# Patient Record
Sex: Female | Born: 1995
Health system: Southern US, Community
[De-identification: ages and names within clinical notes are randomized; demographics above are authoritative.]

## PROBLEM LIST (undated history)

## (undated) DIAGNOSIS — D649 Anemia, unspecified: Secondary | ICD-10-CM

## (undated) DIAGNOSIS — K209 Esophagitis, unspecified without bleeding: Secondary | ICD-10-CM

## (undated) DIAGNOSIS — R011 Cardiac murmur, unspecified: Secondary | ICD-10-CM

## (undated) DIAGNOSIS — F32A Depression, unspecified: Secondary | ICD-10-CM

## (undated) DIAGNOSIS — F419 Anxiety disorder, unspecified: Secondary | ICD-10-CM

## (undated) DIAGNOSIS — F329 Major depressive disorder, single episode, unspecified: Secondary | ICD-10-CM

## (undated) DIAGNOSIS — F1290 Cannabis use, unspecified, uncomplicated: Secondary | ICD-10-CM

## (undated) DIAGNOSIS — T7840XA Allergy, unspecified, initial encounter: Secondary | ICD-10-CM

## (undated) DIAGNOSIS — K219 Gastro-esophageal reflux disease without esophagitis: Secondary | ICD-10-CM

## (undated) DIAGNOSIS — K2970 Gastritis, unspecified, without bleeding: Secondary | ICD-10-CM

## (undated) DIAGNOSIS — R112 Nausea with vomiting, unspecified: Secondary | ICD-10-CM

## (undated) HISTORY — DX: Gastritis, unspecified, without bleeding: K29.70

## (undated) HISTORY — DX: Esophagitis, unspecified without bleeding: K20.90

## (undated) HISTORY — DX: Anemia, unspecified: D64.9

## (undated) HISTORY — DX: Cardiac murmur, unspecified: R01.1

## (undated) HISTORY — DX: Major depressive disorder, single episode, unspecified: F32.9

## (undated) HISTORY — DX: Anxiety disorder, unspecified: F41.9

## (undated) HISTORY — DX: Gastro-esophageal reflux disease without esophagitis: K21.9

## (undated) HISTORY — DX: Allergy, unspecified, initial encounter: T78.40XA

## (undated) HISTORY — DX: Depression, unspecified: F32.A

## (undated) HISTORY — PX: NO PAST SURGERIES: SHX2092

---
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
## 2016-07-16 DIAGNOSIS — F321 Major depressive disorder, single episode, moderate: Secondary | ICD-10-CM | POA: Insufficient documentation

## 2017-04-10 DIAGNOSIS — E876 Hypokalemia: Secondary | ICD-10-CM | POA: Diagnosis not present

## 2017-04-10 DIAGNOSIS — R112 Nausea with vomiting, unspecified: Secondary | ICD-10-CM | POA: Diagnosis not present

## 2017-04-10 DIAGNOSIS — E860 Dehydration: Secondary | ICD-10-CM | POA: Diagnosis not present

## 2017-04-10 DIAGNOSIS — R197 Diarrhea, unspecified: Secondary | ICD-10-CM | POA: Diagnosis not present

## 2017-04-10 DIAGNOSIS — Z883 Allergy status to other anti-infective agents status: Secondary | ICD-10-CM | POA: Diagnosis not present

## 2017-06-20 DIAGNOSIS — R1084 Generalized abdominal pain: Secondary | ICD-10-CM | POA: Diagnosis not present

## 2017-06-20 DIAGNOSIS — E876 Hypokalemia: Secondary | ICD-10-CM | POA: Diagnosis not present

## 2017-06-20 DIAGNOSIS — R112 Nausea with vomiting, unspecified: Secondary | ICD-10-CM | POA: Diagnosis not present

## 2017-06-20 DIAGNOSIS — R1013 Epigastric pain: Secondary | ICD-10-CM | POA: Diagnosis not present

## 2017-06-21 DIAGNOSIS — R112 Nausea with vomiting, unspecified: Secondary | ICD-10-CM | POA: Diagnosis not present

## 2017-06-21 DIAGNOSIS — R1013 Epigastric pain: Secondary | ICD-10-CM | POA: Diagnosis not present

## 2017-06-21 DIAGNOSIS — E876 Hypokalemia: Secondary | ICD-10-CM | POA: Diagnosis not present

## 2017-06-21 DIAGNOSIS — R1084 Generalized abdominal pain: Secondary | ICD-10-CM | POA: Diagnosis not present

## 2017-06-21 DIAGNOSIS — R1011 Right upper quadrant pain: Secondary | ICD-10-CM | POA: Diagnosis not present

## 2017-06-23 ENCOUNTER — Encounter: Payer: Self-pay | Admitting: Family Medicine

## 2017-06-23 ENCOUNTER — Encounter: Payer: Self-pay | Admitting: Nurse Practitioner

## 2017-06-23 ENCOUNTER — Other Ambulatory Visit: Payer: Self-pay

## 2017-06-23 ENCOUNTER — Ambulatory Visit (INDEPENDENT_AMBULATORY_CARE_PROVIDER_SITE_OTHER): Payer: 59 | Admitting: Family Medicine

## 2017-06-23 VITALS — BP 132/84 | HR 100 | Temp 98.2°F | Ht 65.0 in | Wt 147.8 lb

## 2017-06-23 DIAGNOSIS — R112 Nausea with vomiting, unspecified: Secondary | ICD-10-CM | POA: Diagnosis not present

## 2017-06-23 DIAGNOSIS — R1013 Epigastric pain: Secondary | ICD-10-CM

## 2017-06-23 MED ORDER — OMEPRAZOLE 20 MG PO CPDR: 20.0000 mg | DELAYED_RELEASE_CAPSULE | Freq: Two times a day (BID) | ORAL | 0 refills | Status: DC

## 2017-06-23 MED ORDER — PROMETHAZINE HCL 12.5 MG PO TABS
12.5000 mg | ORAL_TABLET | Freq: Three times a day (TID) | ORAL | 0 refills | Status: DC | PRN
Start: 2017-06-23 — End: 2017-09-16

## 2017-06-23 NOTE — Progress Notes (Signed)
5/9/201911:44 AM  Diana Thomas 08-22-1995, 22 y.o. female [id number]  Chief Complaint  Patient presents with  . Abdominal Pain    Just released from ED for upper gastric pain for 3 days, having pain and burning with nauasea and vomiting, Cannot eat due to not feeling well. The medication given is not working    HPI:   Patient is a 22 y.o. female with past medical history significant for GERD  who presents today for ER followup.  Patient seen in the ER (not [facility]) yesterday, was rx carafate and prilosec  Patient reports that for past 1-2 years she has had occasional heartburn and pepcid would always work However for past week there has been an increase in her heartburn frequency and intensity She then had a night of binge drinking and she started vomiting and would not stop so she went to the ER Brings report from ER WBC 14 ALT 36 U/A with high spec grav K 3.0  Neg lipase Neg UPT RUQ [place] - neg Abd CT w contrast - neg Today able to drink small sips of water after taking phenegran Tried a smoothie, spirite/crackers but vomited, she denies any coffee ground emesis Eating has been making pain worse Today she is feeling hungry Last BM was floaty and dark but she thinks that is from peptobismol She denies any fever or chills   Fall Risk  06/23/2017  Falls in the past year? No     Depression screen [facility] 2/9 06/23/2017  Decreased Interest (No Data)    No Known Allergies  Prior to Admission medications   Medication Sig Start Date End Date Taking? Authorizing Provider  clonazePAM (KLONOPIN) 0.5 MG tablet Take 0.5 mg by mouth 2 (two) times daily as needed for anxiety.   Yes [provider]  escitalopram (LEXAPRO) 20 MG tablet Take 20 mg by mouth daily.   Yes [provider]  omeprazole (PRILOSEC) 20 MG capsule Take 20 mg by mouth daily.   Yes [provider]  ondansetron (ZOFRAN) 8 MG tablet Take by mouth every 8 (eight) hours as needed for nausea or  vomiting.   Yes [provider]  sucralfate (CARAFATE) 1 g tablet Take 1 g by mouth 4 (four) times daily -  with meals and at bedtime.   Yes [provider]    Past Medical History:  Diagnosis Date  . Allergy   . Anemia   . Anxiety   . Depression   . Gastritis   . Heart murmur     History reviewed. No pertinent surgical history.  Social History   Tobacco Use  . Smoking status: Never Smoker  . Smokeless tobacco: Never Used  Substance Use Topics  . Alcohol use: Yes    Frequency: Never    Comment: occasional drinker    Family History  Problem Relation Age of Onset  . Heart disease Mother   . Hypertension Mother   . Hyperlipidemia Mother   . ADD / ADHD Father   . Healthy Father   . Healthy Sister     ROS Per hpi  OBJECTIVE:  Blood pressure 132/84, pulse 100, temperature 98.2 F (36.8 C), temperature source Oral, height  (1.651 m), weight 147 lb 12.8 oz (67 kg), last menstrual period 06/01/2017, SpO2 99 %.  Physical Exam  Constitutional: She is oriented to person, place, and time. She appears well-developed and well-nourished.  HENT:  Head: Normocephalic and atraumatic.  Mouth/Throat: Oropharynx is clear and moist. No  oropharyngeal exudate.  Eyes: Pupils are equal, round, and reactive to light. EOM are normal. No scleral icterus.  Neck: Neck supple.  Cardiovascular: Normal rate, regular rhythm and normal heart sounds. Exam reveals no gallop and no friction rub.  No murmur heard. Pulmonary/Chest: Effort normal and breath sounds normal. She has no wheezes. She has no rales.  Abdominal: Soft. Bowel sounds are normal. There is no hepatosplenomegaly. There is tenderness in the epigastric area. There is no rebound and no guarding.  Musculoskeletal: She exhibits no edema.  Neurological: She is alert and oriented to person, place, and time.  Skin: Skin is warm and dry.  Psychiatric: She has a normal mood and affect.  Nursing note and vitals  reviewed.    ASSESSMENT and PLAN  1. Abdominal pain, epigastric - H. pylori breath test - Ambulatory referral to Gastroenterology  2. Nausea and vomiting, intractability of vomiting not specified, unspecified vomiting type Continue with supportive measures. Discussed RTC . Excused from work. - H. pylori breath test - Ambulatory referral to Gastroenterology  Other orders - promethazine (PHENERGAN) 12.5 MG tablet; Take 1 tablet (12.5 mg total) by mouth every 8 (eight) hours as needed for nausea or vomiting.  Return in about 1 week (around 06/30/2017), or if symptoms worsen or fail to improve.    Myles Lipps, MD Primary Care at [facility] [address] [place], [place] [address] Ph.  [phone] Fax [phone]

## 2017-06-23 NOTE — Patient Instructions (Addendum)
     IF you received an x-ray today, you will receive an invoice from [facility]. Please contact [facility] at [phone] with questions or concerns regarding your invoice.   IF you received labwork today, you will receive an invoice from [facility]. Please contact [facility] at [phone] with questions or concerns regarding your invoice.   Our billing staff will not be able to assist you with questions regarding bills from these companies.  You will be contacted with the lab results as soon as they are available. The fastest way to get your results is to activate your My Chart account. Instructions are located on the last page of this paperwork. If you have not heard from us regarding the results in 2 weeks, please contact this office.     

## 2017-06-24 LAB — H. PYLORI BREATH TEST: H pylori Breath Test: NEGATIVE

## 2017-06-30 ENCOUNTER — Ambulatory Visit: Payer: 59 | Admitting: Family Medicine

## 2017-07-05 ENCOUNTER — Ambulatory Visit: Payer: 59 | Admitting: Family Medicine

## 2017-07-13 ENCOUNTER — Ambulatory Visit: Payer: Self-pay | Admitting: Nurse Practitioner

## 2017-07-26 DIAGNOSIS — H5213 Myopia, bilateral: Secondary | ICD-10-CM | POA: Diagnosis not present

## 2017-07-26 DIAGNOSIS — H52223 Regular astigmatism, bilateral: Secondary | ICD-10-CM | POA: Diagnosis not present

## 2017-09-16 ENCOUNTER — Ambulatory Visit (INDEPENDENT_AMBULATORY_CARE_PROVIDER_SITE_OTHER): Payer: 59 | Admitting: Family Medicine

## 2017-09-16 ENCOUNTER — Encounter: Payer: Self-pay | Admitting: Family Medicine

## 2017-09-16 ENCOUNTER — Other Ambulatory Visit: Payer: Self-pay

## 2017-09-16 VITALS — BP 111/72 | HR 72 | Temp 98.0°F | Ht 64.5 in | Wt 147.8 lb

## 2017-09-16 DIAGNOSIS — Z113 Encounter for screening for infections with a predominantly sexual mode of transmission: Secondary | ICD-10-CM

## 2017-09-16 DIAGNOSIS — Z01419 Encounter for gynecological examination (general) (routine) without abnormal findings: Secondary | ICD-10-CM

## 2017-09-16 DIAGNOSIS — Z30013 Encounter for initial prescription of injectable contraceptive: Secondary | ICD-10-CM | POA: Diagnosis not present

## 2017-09-16 DIAGNOSIS — F411 Generalized anxiety disorder: Secondary | ICD-10-CM | POA: Insufficient documentation

## 2017-09-16 DIAGNOSIS — N926 Irregular menstruation, unspecified: Secondary | ICD-10-CM | POA: Diagnosis not present

## 2017-09-16 LAB — POCT URINE PREGNANCY: Preg Test, Ur: NEGATIVE

## 2017-09-16 MED ORDER — MEDROXYPROGESTERONE ACETATE 150 MG/ML IM SUSY
150.0000 mg | PREFILLED_SYRINGE | INTRAMUSCULAR | Status: AC
  Administered 2017-09-16 – 2018-03-09 (×3): 150 mg via INTRAMUSCULAR

## 2017-09-16 NOTE — Patient Instructions (Addendum)
For therapy -- 1. [facility] ([place], [place], [place]) [phone]  2. [facility]) [phone] 3. [place] [facility] - [phone] 4. [facility] - [phone] (do not file insurance) 5. [facility] accepts most major insurances. 2048776424 or email [email or web address]  Next depo oct 18 - nov 1, nurse visit ok  IF you received an x-ray today, you will receive an invoice from [facility]. Please contact [facility] at [phone] with questions or concerns regarding your invoice.   IF you received labwork today, you will receive an invoice from [place] [facility]. Please contact [facility] at [phone] with questions or concerns regarding your invoice.   Our billing staff will not be able to assist you with questions regarding bills from these companies.  You will be contacted with the lab results as soon as they are available. The fastest way to get your results is to activate your My Chart account. Instructions are located on the last page of this paperwork. If you have not heard from [place] regarding the results in 2 weeks, please contact this office.     Preventive Care 18-39 Years, Female Preventive care refers to lifestyle choices and visits with your health care provider that can promote health and wellness. What does preventive care include?  A yearly physical exam. This is also called an annual well check.  Dental exams once or twice a year.  Routine eye exams. Ask your health care provider how often you should have your eyes checked.  Personal lifestyle choices, including: ? Daily care of your teeth and gums. ? Regular physical activity. ? Eating a healthy diet. ? Avoiding tobacco and drug use. ? Limiting alcohol use. ? Practicing safe sex. ? Taking vitamin and mineral supplements as recommended by  your health care provider. What happens during an annual well check? The services and screenings done by your health care provider during your annual well check will depend on your age, overall health, lifestyle risk factors, and family history of disease. Counseling Your health care provider may ask you questions about your:  Alcohol use.  Tobacco use.  Drug use.  Emotional well-being.  Home and relationship well-being.  Sexual activity.  Eating habits.  Work and work Statistician.  Method of birth control.  Menstrual cycle.  Pregnancy history.  Screening You may have the following tests or measurements:  Height, weight, and BMI.  Diabetes screening. This is done by checking your blood sugar (glucose) after you have not eaten for a while (fasting).  Blood pressure.  Lipid and cholesterol levels. These may be checked every 5 years starting at age 70.  Skin check.  Hepatitis C blood test.  Hepatitis B blood test.  Sexually transmitted disease (STD) testing.  BRCA-related cancer screening. This may be done if you have a family history of breast, ovarian, tubal, or peritoneal cancers.  Pelvic exam and Pap test. This may be done every 3 years starting at age 78. Starting at age 65, this may be done every 5 years if you have a Pap test in combination with an HPV test.  Discuss your test results, treatment options, and if necessary, the need for more tests with your health care provider. Vaccines Your health care provider may recommend certain vaccines, such as:  Influenza vaccine. This is recommended every year.  Tetanus, diphtheria, and acellular pertussis (Tdap, Td) vaccine. You may need a Td booster every 10 years.  Varicella vaccine. You may need this if you have not been vaccinated.  HPV vaccine. If you are 65 or younger, you may need three doses over 6 months.  Measles, mumps, and rubella (MMR) vaccine. You may need at least one dose of MMR. You may also  need a second dose.  Pneumococcal 13-valent conjugate (PCV13) vaccine. You may need this if you have certain conditions and were not previously vaccinated.  Pneumococcal polysaccharide (PPSV23) vaccine. You may need one or two doses if you smoke cigarettes or if you have certain conditions.  Meningococcal vaccine. One dose is recommended if you are age 37-21 years and a first-year college student living in a residence hall, or if you have one of several medical conditions. You may also need additional booster doses.  Hepatitis A vaccine. You may need this if you have certain conditions or if you travel or work in places where you may be exposed to hepatitis A.  Hepatitis B vaccine. You may need this if you have certain conditions or if you travel or work in places where you may be exposed to hepatitis B.  Haemophilus influenzae type b (Hib) vaccine. You may need this if you have certain risk factors.  Talk to your health care provider about which screenings and vaccines you need and how often you need them. This information is not intended to replace advice given to you by your health care provider. Make sure you discuss any questions you have with your health care provider. Document Released: 03/30/2001 Document Revised: 10/22/2015 Document Reviewed: 12/03/2014 Elsevier Interactive Patient Education  [employer].

## 2017-09-16 NOTE — Progress Notes (Signed)
8/2/201910:22 AM  Diana Thomas 03-Sep-1995, 22 y.o. female [id number]  Chief Complaint  Patient presents with  . Gynecologic Exam    HPI:   Patient is a 22 y.o. female with past medical history significant for anxiety who presents today for CPE  Last CPE a year ago G0 Last pap about a year or so ago, denies abnormal H/o chalmydia in the past, requesting testing today, denies sx Menses irregular, about q 45 days, normal flow, not very painful Using condoms, would like to get back on depo, has done well with it in the past Sees dentist twice a year and eye doctor once a year Works as a Marine scientist - night shift, lives alone, currently single Does not exercise often, average diet Not taking anxiety meds, requesting counseling info Denies fhx breast, ovarian or colon cancer  Fall Risk  09/16/2017 06/23/2017  Falls in the past year? No No     Depression screen [facility] 2/9 09/16/2017 06/23/2017  Decreased Interest 0 (No Data)  Down, Depressed, Hopeless 0 -  PHQ - 2 Score 0 -    No Known Allergies  Prior to Admission medications   Medication Sig Start Date End Date Taking? Authorizing Provider  omeprazole (PRILOSEC) 20 MG capsule Take 1 capsule (20 mg total) by mouth 2 (two) times daily before a meal. 06/23/17  Yes Rutherford Guys, MD  sucralfate (CARAFATE) 1 g tablet Take 1 g by mouth 4 (four) times daily -  with meals and at bedtime.   Yes [provider]  clonazePAM (KLONOPIN) 0.5 MG tablet Take 0.5 mg by mouth 2 (two) times daily as needed for anxiety.    [provider]  escitalopram (LEXAPRO) 20 MG tablet Take 20 mg by mouth daily.    [provider]  promethazine (PHENERGAN) 12.5 MG tablet Take 1 tablet (12.5 mg total) by mouth every 8 (eight) hours as needed for nausea or vomiting. Patient not taking: Reported on 09/16/2017 06/23/17   Rutherford Guys, MD    Past Medical History:  Diagnosis Date  . Allergy   . Anemia   . Anxiety   . Depression   .  Gastritis   . Heart murmur     No past surgical history on file.  Social History   Tobacco Use  . Smoking status: Never Smoker  . Smokeless tobacco: Never Used  Substance Use Topics  . Alcohol use: Yes    Frequency: Never    Comment: occasional drinker    Family History  Problem Relation Age of Onset  . Heart disease Mother   . Hypertension Mother   . Hyperlipidemia Mother   . ADD / ADHD Father   . Healthy Father   . Healthy Sister     Review of Systems  Constitutional: Negative for chills and fever.  Respiratory: Negative for cough and shortness of breath.   Cardiovascular: Negative for chest pain, palpitations and leg swelling.  Gastrointestinal: Negative for abdominal pain, nausea and vomiting.  Psychiatric/Behavioral: The patient is nervous/anxious. The patient does not have insomnia.   All other systems reviewed and are negative.   OBJECTIVE:  Blood pressure 111/72, pulse 72, temperature 98 F (36.7 C), temperature source Oral, height 5' 4.5" (1.638 m), weight 147 lb 12.8 oz (67 kg), last menstrual period 08/01/2017, SpO2 100 %. Body mass index is 24.98 kg/m.   Physical Exam  Constitutional: She is oriented to person, place, and time. She appears well-developed and well-nourished.  HENT:  Head:  Normocephalic and atraumatic.  Right Ear: Hearing, tympanic membrane, external ear and ear canal normal.  Left Ear: Hearing, tympanic membrane, external ear and ear canal normal.  Mouth/Throat: Oropharynx is clear and moist.  Eyes: Pupils are equal, round, and reactive to light. Conjunctivae and EOM are normal.  Neck: Neck supple. No thyromegaly present.  Cardiovascular: Normal rate, regular rhythm, normal heart sounds and intact distal pulses. Exam reveals no gallop and no friction rub.  No murmur heard. Pulmonary/Chest: Effort normal and breath sounds normal. She has no wheezes. She has no rhonchi. She has no rales. Right breast exhibits no inverted nipple, no mass,  no nipple discharge, no skin change and no tenderness. Left breast exhibits no inverted nipple, no mass, no nipple discharge, no skin change and no tenderness. Breasts are symmetrical.  Abdominal: Soft. Bowel sounds are normal. She exhibits no distension. There is no hepatosplenomegaly. There is no tenderness. There is no guarding.  Genitourinary: There is no rash or lesion on the right labia. There is no rash or lesion on the left labia. Uterus is not enlarged, not fixed and not tender. Cervix exhibits no motion tenderness, no discharge and no friability. Right adnexum displays no mass and no tenderness. Left adnexum displays no mass and no tenderness. No erythema in the vagina. No vaginal discharge found.  Musculoskeletal: Normal range of motion. She exhibits no edema.  Lymphadenopathy:    She has no cervical adenopathy.    She has no axillary adenopathy.       Right: No supraclavicular adenopathy present.       Left: No supraclavicular adenopathy present.  Neurological: She is alert and oriented to person, place, and time. She has normal reflexes.  Skin: Skin is warm and dry.  Psychiatric: She has a normal mood and affect.  Nursing note and vitals reviewed.   Results for orders placed or performed in visit on 09/16/17 (from the past 24 hour(s))  POCT urine pregnancy     Status: None   Collection Time: 09/16/17 10:59 AM  Result Value Ref Range   Preg Test, Ur Negative Negative      ASSESSMENT and PLAN  1. Encounter for annual routine gynecological examination No concerns per history or exam. Routine HCM labs ordered. HCM reviewed/discussed. Anticipatory guidance regarding healthy weight, lifestyle and choices given.  - CMP14+EGFR - Pap IG, CT/NG w/ reflex HPV when ASC-U  2. Screening examination for STD (sexually transmitted disease) - HCV Ab w Reflex to Quant PCR - RPR - WET PREP FOR TRICH, YEAST, CLUE - Pap IG, CT/NG w/ reflex HPV when ASC-U  3. Generalized anxiety  disorder Provided info for therapy  4. Irregular menses - TSH  5. Encounter for initial prescription of injectable contraceptive Next depo due oct 18 - nov 1, nurse visit ok - POCT urine pregnancy - medroxyPROGESTERone Acetate SUSY 150 mg  No follow-ups on file.    Rutherford Guys, MD Primary Care at [place], [place] [address] Ph.  [phone] Fax [phone]

## 2017-09-17 LAB — WET PREP FOR TRICH, YEAST, CLUE
Clue Cell Exam: NEGATIVE
Trichomonas Exam: NEGATIVE
Yeast Exam: NEGATIVE

## 2017-09-17 LAB — CMP14+EGFR
ALT: 12 IU/L (ref 0–32)
AST: 17 IU/L (ref 0–40)
Albumin/Globulin Ratio: 1.8 (ref 1.2–2.2)
Albumin: 4.9 g/dL (ref 3.5–5.5)
Alkaline Phosphatase: 68 IU/L (ref 39–117)
BUN/Creatinine Ratio: 15 (ref 9–23)
BUN: 12 mg/dL (ref 6–20)
Bilirubin Total: 0.3 mg/dL (ref 0.0–1.2)
CO2: 22 mmol/L (ref 20–29)
Calcium: 9.9 mg/dL (ref 8.7–10.2)
Chloride: 102 mmol/L (ref 96–106)
Creatinine, Ser: 0.82 mg/dL (ref 0.57–1.00)
GFR calc Af Amer: 116 mL/min/{1.73_m2} (ref >59)
GFR calc non Af Amer: 100 mL/min/{1.73_m2} (ref >59)
Globulin, Total: 2.8 g/dL (ref 1.5–4.5)
Glucose: 87 mg/dL (ref 65–99)
Potassium: 4.2 mmol/L (ref 3.5–5.2)
Sodium: 141 mmol/L (ref 134–144)
Total Protein: 7.7 g/dL (ref 6.0–8.5)

## 2017-09-17 LAB — HCV INTERPRETATION

## 2017-09-17 LAB — HCV AB W REFLEX TO QUANT PCR: HCV Ab: <0.1 s/co ratio (ref 0.0–0.9)

## 2017-09-17 LAB — RPR: RPR Ser Ql: NONREACTIVE

## 2017-09-17 LAB — TSH: TSH: 0.575 u[IU]/mL (ref 0.450–4.500)

## 2017-09-21 LAB — PAP IG, CT-NG, RFX HPV ASCU
Chlamydia, Nuc. Acid Amp: NEGATIVE
Gonococcus by Nucleic Acid Amp: NEGATIVE
PAP Smear Comment: 0

## 2017-09-26 ENCOUNTER — Telehealth: Payer: Self-pay | Admitting: Family Medicine

## 2017-09-26 NOTE — Telephone Encounter (Signed)
Copied from CRM [id number]. Topic: Quick Communication - See Telephone Encounter >> Sep 26, 2017 10:28 AM Lenoria ChimeBeasley, Denise S wrote: CRM for notification. Pt wants Dr Leretha PolSantiago to give her  a prescription for Lexapro. Pt would like a call back from the office. [facility] [place] - [address] [phone] (Phone) [phone] (Fax)

## 2017-09-29 MED ORDER — ESCITALOPRAM OXALATE 20 MG PO TABS
20.0000 mg | ORAL_TABLET | Freq: Every day | ORAL | 1 refills | Status: DC
Start: 2017-10-31 — End: 2017-10-02

## 2017-09-29 MED ORDER — ESCITALOPRAM OXALATE 20 MG PO TABS: ORAL_TABLET | ORAL | 0 refills | Status: DC

## 2017-09-29 NOTE — Telephone Encounter (Signed)
We discussed this at her last visit Has been on lexapro in the past  Restarted as requested Started 10mg  x 8 days then increase to 20mg 

## 2017-09-29 NOTE — Addendum Note (Signed)
Addended by: Myles LippsSANTIAGO, IRMA M on: 09/29/2017 10:38 PM   Modules accepted: Orders

## 2017-09-30 MED FILL — ESCITALOPRAM 20 MG TABLET: 20 | 30 days supply | Qty: 26 | Fill #0

## 2017-10-02 ENCOUNTER — Encounter (HOSPITAL_COMMUNITY): Payer: Self-pay

## 2017-10-02 ENCOUNTER — Other Ambulatory Visit: Payer: Self-pay

## 2017-10-02 ENCOUNTER — Emergency Department (HOSPITAL_COMMUNITY)
Admission: EM | Admit: 2017-10-02 | Discharge: 2017-10-03 | Disposition: A | Discharge status: Home / Self Care | Payer: 59 | Attending: Emergency Medicine | Admitting: Emergency Medicine

## 2017-10-02 DIAGNOSIS — Z79899 Other long term (current) drug therapy: Secondary | ICD-10-CM | POA: Insufficient documentation

## 2017-10-02 DIAGNOSIS — E876 Hypokalemia: Secondary | ICD-10-CM | POA: Insufficient documentation

## 2017-10-02 DIAGNOSIS — K2970 Gastritis, unspecified, without bleeding: Secondary | ICD-10-CM | POA: Diagnosis not present

## 2017-10-02 DIAGNOSIS — R42 Dizziness and giddiness: Secondary | ICD-10-CM | POA: Insufficient documentation

## 2017-10-02 DIAGNOSIS — R1013 Epigastric pain: Secondary | ICD-10-CM | POA: Diagnosis not present

## 2017-10-02 DIAGNOSIS — R1011 Right upper quadrant pain: Secondary | ICD-10-CM | POA: Diagnosis not present

## 2017-10-02 DIAGNOSIS — R112 Nausea with vomiting, unspecified: Secondary | ICD-10-CM

## 2017-10-02 DIAGNOSIS — R55 Syncope and collapse: Secondary | ICD-10-CM | POA: Diagnosis not present

## 2017-10-02 DIAGNOSIS — R109 Unspecified abdominal pain: Secondary | ICD-10-CM | POA: Diagnosis not present

## 2017-10-02 LAB — I-STAT TROPONIN, ED: TROPONIN I, POC: 0 ng/mL (ref 0.00–0.08)

## 2017-10-02 LAB — CBC WITH DIFFERENTIAL/PLATELET
BASOS PCT: 0 %
Basophils Absolute: 0 10*3/uL (ref 0.0–0.1)
EOS ABS: 0.1 10*3/uL (ref 0.0–0.7)
EOS PCT: 1 %
HCT: 40.5 % (ref 36.0–46.0)
Hemoglobin: 13.6 g/dL (ref 12.0–15.0)
LYMPHS ABS: 4.4 10*3/uL — AB (ref 0.7–4.0)
Lymphocytes Relative: 25 %
MCH: 28.4 pg (ref 26.0–34.0)
MCHC: 33.6 g/dL (ref 30.0–36.0)
MCV: 84.6 fL (ref 78.0–100.0)
MONOS PCT: 5 %
Monocytes Absolute: 0.9 10*3/uL (ref 0.1–1.0)
Neutro Abs: 12.2 10*3/uL — ABNORMAL HIGH (ref 1.7–7.7)
Neutrophils Relative %: 69 %
PLATELETS: 320 10*3/uL (ref 150–400)
RBC: 4.79 MIL/uL (ref 3.87–5.11)
RDW: 12.5 % (ref 11.5–15.5)
WBC: 17.6 10*3/uL — AB (ref 4.0–10.5)

## 2017-10-02 LAB — COMPREHENSIVE METABOLIC PANEL WITH GFR
ALT: 19 U/L (ref 0–44)
AST: 23 U/L (ref 15–41)
Albumin: 4.6 g/dL (ref 3.5–5.0)
Alkaline Phosphatase: 57 U/L (ref 38–126)
Anion gap: 12 (ref 5–15)
BUN: 16 mg/dL (ref 6–20)
CO2: 22 mmol/L (ref 22–32)
Calcium: 9.3 mg/dL (ref 8.9–10.3)
Chloride: 105 mmol/L (ref 98–111)
Creatinine, Ser: 0.88 mg/dL (ref 0.44–1.00)
GFR calc Af Amer: >60 mL/min
GFR calc non Af Amer: >60 mL/min
Glucose, Bld: 161 mg/dL — ABNORMAL HIGH (ref 70–99)
Potassium: 2.7 mmol/L — CL (ref 3.5–5.1)
Sodium: 139 mmol/L (ref 135–145)
Total Bilirubin: 0.5 mg/dL (ref 0.3–1.2)
Total Protein: 8.3 g/dL — ABNORMAL HIGH (ref 6.5–8.1)

## 2017-10-02 LAB — LIPASE, BLOOD: Lipase: 31 U/L (ref 11–51)

## 2017-10-02 LAB — I-STAT BETA HCG BLOOD, ED (MC, WL, AP ONLY): I-stat hCG, quantitative: <5 m[IU]/mL (ref <5)

## 2017-10-02 MED ORDER — GI COCKTAIL ~~LOC~~
30.0000 mL | Freq: Once | ORAL | Status: AC
  Administered 2017-10-02: 30 mL via ORAL
  Filled 2017-10-02: qty 30

## 2017-10-02 MED ORDER — FAMOTIDINE IN NACL 20-0.9 MG/50ML-% IV SOLN
20.0000 mg | Freq: Once | INTRAVENOUS | Status: AC
  Administered 2017-10-02: 20 mg via INTRAVENOUS
  Filled 2017-10-02: qty 50

## 2017-10-02 MED ORDER — ONDANSETRON 4 MG PO TBDP: 4.0000 mg | ORAL_TABLET | Freq: Once | ORAL | Status: DC

## 2017-10-02 MED ORDER — POTASSIUM CHLORIDE 10 MEQ/100ML IV SOLN
10.0000 meq | INTRAVENOUS | Status: AC
  Administered 2017-10-02 – 2017-10-03 (×4): 10 meq via INTRAVENOUS
  Filled 2017-10-02 (×4): qty 100

## 2017-10-02 MED ORDER — POTASSIUM CHLORIDE CRYS ER 20 MEQ PO TBCR
40.0000 meq | EXTENDED_RELEASE_TABLET | Freq: Once | ORAL | Status: DC
  Filled 2017-10-02: qty 2

## 2017-10-02 MED ORDER — LORAZEPAM 2 MG/ML IJ SOLN
1.0000 mg | Freq: Once | INTRAMUSCULAR | Status: AC
  Administered 2017-10-02: 1 mg via INTRAVENOUS
  Filled 2017-10-02: qty 1

## 2017-10-02 MED ORDER — ONDANSETRON 8 MG PO TBDP
8.0000 mg | ORAL_TABLET | Freq: Once | ORAL | Status: AC
  Administered 2017-10-02: 8 mg via ORAL
  Filled 2017-10-02: qty 1

## 2017-10-02 MED ORDER — PROMETHAZINE HCL 25 MG/ML IJ SOLN
25.0000 mg | Freq: Once | INTRAMUSCULAR | Status: AC
  Administered 2017-10-02: 25 mg via INTRAVENOUS
  Filled 2017-10-02: qty 1

## 2017-10-02 MED ORDER — SODIUM CHLORIDE 0.9 % IV BOLUS
2000.0000 mL | Freq: Once | INTRAVENOUS | Status: AC
  Administered 2017-10-02: 2000 mL via INTRAVENOUS

## 2017-10-02 NOTE — ED Notes (Signed)
Pt had a sip of her Gingerale. Pt states it made her stomach pain worse. Pt refused to take PO potassium at this time. Pt has complaints of nausea at this time.

## 2017-10-02 NOTE — ED Notes (Signed)
Walked into pt's room to replace bag of potassium, but pt had already turned off pump.

## 2017-10-02 NOTE — ED Notes (Signed)
Pt refused to sip gingerale and take her PO potassium at this time.

## 2017-10-02 NOTE — ED Notes (Signed)
Attempted PO challenge at this time. Pt refused due to stomach pain.

## 2017-10-02 NOTE — ED Triage Notes (Signed)
Pt brought down from 3rd floor due to dizziness/emesis episodes. Pt has a hx of gastritis and states it feels similar. Pt has had one episode of emesis in the ED.

## 2017-10-02 NOTE — ED Provider Notes (Signed)
[facility]-EMERGENCY DEPT Provider Note   CSN: [id number] Arrival date & time: 10/02/17  1937     History   Chief Complaint Chief Complaint  Patient presents with  . Emesis  . Dizziness    HPI Diana Thomas is a 22 y.o. female with a PMHx of gastritis, anemia, anxiety, depression, and other conditions listed below, who presents to the ED with complaints of n/v that began about 1hr prior to evaluation.  Level 5 caveat due to pt being somewhat of a poor/difficult historian due to current condition and feeling nauseated.  Patient works as a Freight forwardernurse upstairs, arrived at work around 7 PM and had been feeling okay prior to arrival however during report she was standing and started feeling hot, got sweaty, then started having nausea and vomiting, and then began having epigastric abd pain that she states feels like her prior gastritis issues.  She states that over the last "couple days" she has not been eating well due to stress, she reports taking Pepcid and Carafate for gastritis and feels that today's symptoms are very similar to when she has had issues in the past.  She reports feeling hot/sweaty then having 2 episodes of nonbloody nonbilious emesis, one episode of nonbloody looser than normal diarrhea (no watery diarrhea).  She then began having abdominal pain, describing it as a 6/10 constant gnawing nonradiating epigastric pain that worsens with movement and with no treatments tried prior to arrival.  She states that she felt "a little" lightheaded during this episode, but denies syncope or vertiginous symptoms.  She denies recent fevers, chills, CP, SOB, constipation, obstipation, melena, hematochezia, hematemesis, hematuria, dysuria, vaginal bleeding/discharge, myalgias, arthralgias, numbness, tingling, focal weakness, HA, vision changes, syncope, vertigo/dizziness, or any other complaints at this time. Denies recent travel, known sick contacts (although she works as a Engineer, civil (consulting)nurse,  so she isn't sure), suspicious food intake, EtOH use, NSAID use, or prior abd surgeries.   Of note, pt was recently seen at [facility] for a full physical exam, including pelvic exam; her labs from that day were all reassuring, including: CMP WNL, TSH WNL, HCV neg, GC/CT/pap neg, RPR neg, Upreg neg, and wet prep neg.  She was given DepoProvera injection at that visit.  She has a hx of irregular menses, and her LMP was in June.   The history is provided by the patient and medical records. No language interpreter was used.    Past Medical History:  Diagnosis Date  . Allergy   . Anemia   . Anxiety   . Depression   . Gastritis   . Heart murmur     Patient Active Problem List   Diagnosis Date Noted  . Generalized anxiety disorder 09/16/2017    No past surgical history on file.   OB History   None      Home Medications    Prior to Admission medications   Medication Sig Start Date End Date Taking? Authorizing Provider  escitalopram (LEXAPRO) 20 MG tablet Take 0.5 tablets (10 mg total) by mouth daily for 8 days, THEN 1 tablet (20 mg total) daily for 22 days. 09/29/17 10/29/17  Myles LippsSantiago, Irma M, MD  escitalopram (LEXAPRO) 20 MG tablet Take 1 tablet (20 mg total) by mouth daily. 10/31/17   Myles LippsSantiago, Irma M, MD  omeprazole (PRILOSEC) 20 MG capsule Take 1 capsule (20 mg total) by mouth 2 (two) times daily before a meal. 06/23/17   Myles LippsSantiago, Irma M, MD  sucralfate (CARAFATE) 1 g tablet  Take 1 g by mouth 4 (four) times daily -  with meals and at bedtime.    [provider]    Family History Family History  Problem Relation Age of Onset  . Heart disease Mother   . Hypertension Mother   . Hyperlipidemia Mother   . ADD / ADHD Father   . Healthy Father   . Healthy Sister     Social History Social History   Tobacco Use  . Smoking status: Never Smoker  . Smokeless tobacco: Never Used  Substance Use Topics  . Alcohol use: Yes    Frequency: Never    Comment: occasional  drinker  . Drug use: Never     Allergies   Patient has no known allergies.   Review of Systems Review of Systems  Constitutional: Positive for diaphoresis. Negative for chills and fever.  Eyes: Negative for visual disturbance.  Respiratory: Negative for shortness of breath.   Cardiovascular: Negative for chest pain.  Gastrointestinal: Positive for abdominal pain, diarrhea, nausea and vomiting. Negative for blood in stool and constipation.  Genitourinary: Negative for dysuria, hematuria, vaginal bleeding and vaginal discharge.  Musculoskeletal: Negative for arthralgias and myalgias.  Skin: Negative for color change.  Allergic/Immunologic: Negative for immunocompromised state.  Neurological: Positive for light-headedness. Negative for dizziness, syncope, weakness, numbness and headaches.  Psychiatric/Behavioral: Negative for confusion.   All other systems reviewed and are negative for acute change except as noted in the HPI.    Physical Exam Updated Vital Signs BP (!) 159/85 (BP Location: Right Arm)   Pulse 70   Temp 97.6 F (36.4 C)   Resp 18   SpO2 99%   Physical Exam  Constitutional: She is oriented to person, place, and time. Vital signs are normal. She appears well-developed and well-nourished.  Non-toxic appearance. No distress.  Afebrile, nontoxic, looks uncomfortable/nauseated, moaning in bed with eyes closed, but in NAD.   HENT:  Head: Normocephalic and atraumatic.  Mouth/Throat: Oropharynx is clear and moist. Mucous membranes are dry.  Dry lips  Eyes: Conjunctivae and EOM are normal. Right eye exhibits no discharge. Left eye exhibits no discharge.  Neck: Normal range of motion. Neck supple.  Cardiovascular: Normal rate, regular rhythm, normal heart sounds and intact distal pulses. Exam reveals no gallop and no friction rub.  No murmur heard. RRR, nl s1/s2, no m/r/g, distal pulses intact, no pedal edema   Pulmonary/Chest: Effort normal and breath sounds normal. No  respiratory distress. She has no decreased breath sounds. She has no wheezes. She has no rhonchi. She has no rales.  Abdominal: Soft. Normal appearance and bowel sounds are normal. She exhibits no distension. There is tenderness in the epigastric area. There is no rigidity, no rebound, no guarding, no CVA tenderness, no tenderness at McBurney's point and negative Murphy's sign.  Soft, nondistended, +BS throughout, with mild epigastric TTP, no r/g/r, neg murphy's, neg mcburney's, no CVA TTP   Musculoskeletal: Normal range of motion.  MAE x4 Strength and sensation grossly intact in all extremities Distal pulses intact Gait steady No pedal edema  Neurological: She is alert and oriented to person, place, and time. She has normal strength. No cranial nerve deficit or sensory deficit. Gait normal. GCS eye subscore is 4. GCS verbal subscore is 5. GCS motor subscore is 6.  No focal neuro deficits on brief exam, pt not really cooperative with full neuro exam however no obvious deficits noted, CN grossly intact GCS 15 Sensation and strength intact Gait nonataxic  Skin: Skin  is warm, dry and intact. No rash noted.  Psychiatric: She has a normal mood and affect.  Nursing note and vitals reviewed.   Orthostatics: Orthostatic VS for the past 24 hrs:  BP- Lying Pulse- Lying BP- Sitting Pulse- Sitting BP- Standing at 0 minutes Pulse- Standing at 0 minutes  10/02/17 2105 120/88 94 (!) 153/106 78 (!) 151/102 86     ED Treatments / Results  Labs (all labs ordered are listed, but only abnormal results are displayed) Labs Reviewed  CBC WITH DIFFERENTIAL/PLATELET - Abnormal; Notable for the following components:      Result Value   WBC 17.6 (*)    Neutro Abs 12.2 (*)    Lymphs Abs 4.4 (*)    All other components within normal limits  COMPREHENSIVE METABOLIC PANEL - Abnormal; Notable for the following components:   Potassium 2.7 (*)    Glucose, Bld 161 (*)    Total Protein 8.3 (*)    All other  components within normal limits  LIPASE, BLOOD  URINALYSIS, ROUTINE W REFLEX MICROSCOPIC  I-STAT BETA HCG BLOOD, ED ([facility], [facility], [facility] ONLY)  I-STAT TROPONIN, ED  I-STAT TROPONIN, ED    EKG EKG Interpretation  Date/Time:  Sunday October 02 2017 20:29:59 EDT Ventricular Rate:  76 PR Interval:    QRS Duration: 80 QT Interval:  375 QTC Calculation: 422 R Axis:   95 Text Interpretation:  Sinus rhythm Borderline right axis deviation Non-specific ST-t changes Confirmed by Ray, Danielle ([id number]) on 10/02/2017 8:42:32 PM   Radiology No results found.  Procedures Procedures (including critical care time)  Medications Ordered in ED Medications  potassium chloride SA (K-DUR,KLOR-CON) CR tablet 40 mEq (0 mEq Oral Hold 10/02/17 2148)  potassium chloride 10 mEq in 100 mL IVPB (10 mEq Intravenous New Bag/Given 10/02/17 2154)  LORazepam (ATIVAN) injection 1 mg (has no administration in time range)  ondansetron (ZOFRAN-ODT) disintegrating tablet 8 mg (8 mg Oral Given 10/02/17 2005)  sodium chloride 0.9 % bolus 2,000 mL (2,000 mLs Intravenous New Bag/Given 10/02/17 2032)  famotidine (PEPCID) IVPB 20 mg premix ( Intravenous Stopped 10/02/17 2104)  gi cocktail (Maalox,Lidocaine,Donnatal) (30 mLs Oral Given 10/02/17 2148)  promethazine (PHENERGAN) injection 25 mg (25 mg Intravenous Given 10/02/17 2029)     Initial Impression / Assessment and Plan / ED Course  I have reviewed the triage vital signs and the nursing notes.  Pertinent labs & imaging results that were available during my care of the patient were reviewed by me and considered in my medical decision making (see chart for details).     22  y.o. female here with what sounds like a vasovagal episode; pt works as a Freight forwardernurse upstairs, states she hasn't been eating well lately due to stress; was getting report about 1hr prior to evaluation and started feeling hot/sweaty, nauseated, then vomited and had a loose BM, and had epigastric pain that she states  feels like her gastritis. Also reports "a little" lightheadedness. No HA/vision changes, no syncope, no vertigo. On exam, pt appears uncomfortable/nauseated but in NAD, VSS, dry lips, mild epigastric TTP but nonperitoneal and with neg murphy's exam. Able to ambulate unassisted, no focal neuro deficits on brief neuro exam, although pt moaning in bed and not really cooperative with a full neuro exam due to feeling very nauseated. Will get labs and EKG, doubt need for imaging at this time, will get orthostatics, give fluids/zofran/pepcid/GI cocktail, and reassess shortly.   9:42 PM CBC w/diff with leukocytosis WBC 17.6 but differential fairly unremarkable (  ANC elevated but relative% neutrophils WNL), could be stress surge demargination from vasovagal event and vomiting. CMP with hypokalemia 2.7 and gluc 161 (likely stress surge response related) but otherwise WNL, will replete potassium orally and via IV. Lipase WNL. BetaHCG neg. Trop neg. EKG without acute ischemic findings, some nonspecific ST changes which look like possible early repol; no prior for comparison. Orthostatics negative (after 1L of fluids were already given). U/A not yet done. Pt states nausea much better after phenergan, abdomen still hurting a little, hasn't gotten GI cocktail yet but wants to try that. Will have nursing give GI cocktail, PO challenge, give Kdur and start IV potassium replacement, and then reassess after that.   10:47 PM  U/A still not done. Pt states GI cocktail didn't help much, and she tried to drink ginger ale but that made her nauseated again; states it's a burning feeling. Repeat abd exam again reveals that tenderness is just in the epigastric area, no RUQ TTP and neg murphy's sign yet again. I still doubt the need for imaging; will try ativan to see if this helps with her nausea, and retry PO challenge again after that. I still suspect this was a vasovagal episode due to recent poor eating compiled with gastritis.  Awaiting U/A, and will get second troponin since it's been nearly 3hrs. Patient care to be resumed by [facility], PA-C at shift change sign-out. Patient history has been discussed with midlevel resuming care. Plan is to reassess after ativan and PO challenge; If pt feeling better and tolerating PO well, once potassium replacement is finished, she could be discharged with phenergan rx, advise continuation of home meds for gastritis, diet/lifestyle modifications for gastritis, and f/up with PCP. If unimproved, may consider further work up or other meds, and potentially admit if intractable symptoms, however I'm hopeful this won't be necessary as she seems to be improved from when she got here initially. Please see Dahlia Client Muthersbaugh's notes for further documentation of pending results and dispo/care. Pt stable at sign-out and updated on transfer of care.    Final Clinical Impressions(s) / ED Diagnoses   Final diagnoses:  Gastritis, presence of bleeding unspecified, unspecified chronicity, unspecified gastritis type  Nausea and vomiting in adult patient  Vasovagal near-syncope  Hypokalemia  Epigastric abdominal pain    ED Discharge Orders    [address] [place], [place] 10/02/17 2314    Margarita Grizzle, MD 10/14/17 1308

## 2017-10-02 NOTE — ED Notes (Signed)
Pt unable to provide urine specimen at this time

## 2017-10-03 ENCOUNTER — Emergency Department (HOSPITAL_COMMUNITY): Payer: 59

## 2017-10-03 DIAGNOSIS — E876 Hypokalemia: Secondary | ICD-10-CM | POA: Diagnosis not present

## 2017-10-03 DIAGNOSIS — R1013 Epigastric pain: Secondary | ICD-10-CM | POA: Diagnosis not present

## 2017-10-03 DIAGNOSIS — K2970 Gastritis, unspecified, without bleeding: Secondary | ICD-10-CM | POA: Diagnosis not present

## 2017-10-03 DIAGNOSIS — Z79899 Other long term (current) drug therapy: Secondary | ICD-10-CM | POA: Diagnosis not present

## 2017-10-03 DIAGNOSIS — R109 Unspecified abdominal pain: Secondary | ICD-10-CM | POA: Diagnosis not present

## 2017-10-03 DIAGNOSIS — R1011 Right upper quadrant pain: Secondary | ICD-10-CM | POA: Diagnosis not present

## 2017-10-03 DIAGNOSIS — R55 Syncope and collapse: Secondary | ICD-10-CM | POA: Diagnosis not present

## 2017-10-03 DIAGNOSIS — R42 Dizziness and giddiness: Secondary | ICD-10-CM | POA: Diagnosis not present

## 2017-10-03 LAB — URINALYSIS, ROUTINE W REFLEX MICROSCOPIC
BILIRUBIN URINE: NEGATIVE
Glucose, UA: 50 mg/dL — AB
HGB URINE DIPSTICK: NEGATIVE
Ketones, ur: 20 mg/dL — AB
Leukocytes, UA: NEGATIVE
NITRITE: NEGATIVE
PROTEIN: NEGATIVE mg/dL
Specific Gravity, Urine: 1.025 (ref 1.005–1.030)
pH: 6 (ref 5.0–8.0)

## 2017-10-03 LAB — I-STAT TROPONIN, ED: Troponin i, poc: 0 ng/mL (ref 0.00–0.08)

## 2017-10-03 LAB — BASIC METABOLIC PANEL
Anion gap: 9 (ref 5–15)
BUN: 13 mg/dL (ref 6–20)
CALCIUM: 8.4 mg/dL — AB (ref 8.9–10.3)
CO2: 19 mmol/L — AB (ref 22–32)
Chloride: 110 mmol/L (ref 98–111)
Creatinine, Ser: 0.66 mg/dL (ref 0.44–1.00)
GLUCOSE: 130 mg/dL — AB (ref 70–99)
Potassium: 3.8 mmol/L (ref 3.5–5.1)
Sodium: 138 mmol/L (ref 135–145)

## 2017-10-03 MED ORDER — SUCRALFATE 1 G PO TABS: 1.0000 g | ORAL_TABLET | Freq: Three times a day (TID) | ORAL | 0 refills | Status: DC

## 2017-10-03 NOTE — Discharge Instructions (Addendum)
1. Medications: carafate, usual home medications 2. Treatment: rest, drink plenty of fluids, advance diet slowly 3. Follow Up: Please followup with your primary doctor in 2-3 days for discussion of your diagnoses and further evaluation after today's visit; if you do not have a primary care doctor use the resource guide provided to find one; Please return to the ER for persistent vomiting, high fevers or worsening symptoms

## 2017-10-03 NOTE — ED Notes (Signed)
Pt unable to tolerate PO challenge at this time.

## 2017-10-03 NOTE — ED Notes (Signed)
Urine culture sent down with UA. 

## 2017-10-03 NOTE — ED Provider Notes (Signed)
Care [facility] from [facility], VF CorporationPA-C.  Please see her full H&P.  In short,  Diana Thomas is a 22 y.o. female presents for acute, worsening epigastric abdominal pain with one episode of nonbloody nonbilious emesis and one episode of loose stool.  No watery diarrhea.  Patient reports she is previously taken Pepcid and Carafate for gastritis and feels that today's symptoms are similar.  Physical Exam  BP 127/76   Pulse 70   Temp 97.6 F (36.4 C)   Resp (!) 24   Ht 5\' 4"  (1.626 m)   Wt 65.3 kg   LMP 08/02/2017   SpO2 99%   BMI 24.72 kg/m   Physical Exam  Constitutional: She appears well-developed and well-nourished. No distress.  HENT:  Head: Normocephalic.  Eyes: Conjunctivae are normal. No scleral icterus.  Neck: Normal range of motion.  Cardiovascular: Normal rate and intact distal pulses.  Pulmonary/Chest: Effort normal.  Abdominal: Soft. Bowel sounds are normal. There is tenderness in the epigastric area.  Musculoskeletal: Normal range of motion.  Neurological: She is alert.  Skin: Skin is warm and dry.  Nursing note and vitals reviewed.   ED Course/Procedures   Clinical Course as of Oct 03 324  Wynelle LinkSun Oct 02, 2017  2300 Plan: Medication, p.o. trial, potassium and reassess   [HM]  Mon Oct 03, 2017  0155 Continues to have severe abdominal pain.  Tender to palpation in the epigastrium.  Will obtain right upper quadrant ultrasound.   [HM]  0157 Significant hypokalemia.  Patient given oral and IV.  Potassium(!!): 2.7 [HM]  0157 Normal  Lipase: 31 [HM]  0157 No elevation in AST or ALT.  AST: 23 [HM]  0157 Leukocytosis is noted.  WBC(!): 17.6 [HM]  0245 Potassium improved  Potassium: 3.8 [HM]  0322 Patient with some improvement in her pain.  Given soft without rebound or guarding.   [HM]  0323 Ketones noted in her urine, fluids were given.  Ketones, ur(!): 20 [HM]  0323 Afebrile  Temp: 97.6 F (36.4 C) [HM]  0323 No tachycardia  Pulse Rate: 62 [HM]     Clinical Course User Index [HM] Janney Priego, Boyd KerbsHannah, PA-C    Results for orders placed or performed during the hospital encounter of 10/02/17  CBC with Differential  Result Value Ref Range   WBC 17.6 (H) 4.0 - 10.5 K/uL   RBC 4.79 3.87 - 5.11 MIL/uL   Hemoglobin 13.6 12.0 - 15.0 g/dL   HCT 16.140.5 09.636.0 - 04.546.0 %   MCV 84.6 78.0 - 100.0 fL   MCH 28.4 26.0 - 34.0 pg   MCHC 33.6 30.0 - 36.0 g/dL   RDW 40.912.5 81.111.5 - 91.415.5 %   Platelets 320 150 - 400 K/uL   Neutrophils Relative % 69 %   Neutro Abs 12.2 (H) 1.7 - 7.7 K/uL   Lymphocytes Relative 25 %   Lymphs Abs 4.4 (H) 0.7 - 4.0 K/uL   Monocytes Relative 5 %   Monocytes Absolute 0.9 0.1 - 1.0 K/uL   Eosinophils Relative 1 %   Eosinophils Absolute 0.1 0.0 - 0.7 K/uL   Basophils Relative 0 %   Basophils Absolute 0.0 0.0 - 0.1 K/uL  Comprehensive metabolic panel  Result Value Ref Range   Sodium 139 135 - 145 mmol/L   Potassium 2.7 (LL) 3.5 - 5.1 mmol/L   Chloride 105 98 - 111 mmol/L   CO2 22 22 - 32 mmol/L   Glucose, Bld 161 (H) 70 - 99 mg/dL  BUN 16 6 - 20 mg/dL   Creatinine, Ser 1.610.88 0.44 - 1.00 mg/dL   Calcium 9.3 8.9 - 09.610.3 mg/dL   Total Protein 8.3 (H) 6.5 - 8.1 g/dL   Albumin 4.6 3.5 - 5.0 g/dL   AST 23 15 - 41 U/L   ALT 19 0 - 44 U/L   Alkaline Phosphatase 57 38 - 126 U/L   Total Bilirubin 0.5 0.3 - 1.2 mg/dL   GFR calc non Af Amer >60 >60 mL/min   GFR calc Af Amer >60 >60 mL/min   Anion gap 12 5 - 15  Lipase, blood  Result Value Ref Range   Lipase 31 11 - 51 U/L  Urinalysis, Routine w reflex microscopic  Result Value Ref Range   Color, Urine YELLOW YELLOW   APPearance CLEAR CLEAR   Specific Gravity, Urine 1.025 1.005 - 1.030   pH 6.0 5.0 - 8.0   Glucose, UA 50 (A) NEGATIVE mg/dL   Hgb urine dipstick NEGATIVE NEGATIVE   Bilirubin Urine NEGATIVE NEGATIVE   Ketones, ur 20 (A) NEGATIVE mg/dL   Protein, ur NEGATIVE NEGATIVE mg/dL   Nitrite NEGATIVE NEGATIVE   Leukocytes, UA NEGATIVE NEGATIVE  Basic metabolic panel   Result Value Ref Range   Sodium 138 135 - 145 mmol/L   Potassium 3.8 3.5 - 5.1 mmol/L   Chloride 110 98 - 111 mmol/L   CO2 19 (L) 22 - 32 mmol/L   Glucose, Bld 130 (H) 70 - 99 mg/dL   BUN 13 6 - 20 mg/dL   Creatinine, Ser 0.450.66 0.44 - 1.00 mg/dL   Calcium 8.4 (L) 8.9 - 10.3 mg/dL   GFR calc non Af Amer >60 >60 mL/min   GFR calc Af Amer >60 >60 mL/min   Anion gap 9 5 - 15  I-Stat beta hCG blood, ED ([facility], [facility], [facility] only)  Result Value Ref Range   I-stat hCG, quantitative <5.0 <5 mIU/mL   Comment 3          I-stat troponin, ED  Result Value Ref Range   Troponin i, poc 0.00 0.00 - 0.08 ng/mL   Comment 3          I-stat troponin, ED  Result Value Ref Range   Troponin i, poc 0.00 0.00 - 0.08 ng/mL   Comment 3           [place] Abdomen Limited  Result Date: 10/03/2017 CLINICAL DATA:  Initial evaluation for acute right upper quadrant and epigastric abdominal pain, vomiting. EXAM: ULTRASOUND ABDOMEN LIMITED RIGHT UPPER QUADRANT COMPARISON:  None. FINDINGS: Gallbladder: No gallstones or wall thickening visualized. No sonographic Murphy sign noted by sonographer. Common bile duct: Diameter: 3.0 mm Liver: No focal lesion identified. Within normal limits in parenchymal echogenicity. Portal vein is patent on color Doppler imaging with normal direction of blood flow towards the liver. IMPRESSION: Normal right upper quadrant ultrasound. Electronically Signed   By: Rise MuBenjamin  McClintock M.D.   On: 10/03/2017 02:55   Dg Abdomen Acute W/chest  Result Date: 10/03/2017 CLINICAL DATA:  Initial evaluation for acute abdominal pain, emesis, dizziness. EXAM: DG ABDOMEN ACUTE W/ 1V CHEST COMPARISON:  Prior CT from 06/20/2017. FINDINGS: Paucity of gas limits evaluation of the bowels. There is no evidence of dilated bowel loops or free intraperitoneal air. Punctate calcification overlying the liver noted, felt to be to lateral to reflect underlying cholelithiasis. This may lie within the fecal stream. No other radiopaque  calculi or soft tissue mass identified. Heart size and mediastinal  contours are within normal limits. Both lungs are clear. IMPRESSION: Negative abdominal radiographs.  No acute cardiopulmonary disease. Electronically Signed   By: Rise Mu M.D.   On: 10/03/2017 02:53     Procedures  MDM    Patient with epigastric abdominal pain and vomiting prior to arrival.  She is hypokalemic.  Patient given IV and p.o. potassium with significant improvement.  After numerous medication she continues to have pain.  She is noted to have a significant leukocytosis of 17.5.  Acute abdominal films are without free air.  Highly doubt perforated peptic ulcer.  Ultrasound without evidence of Coley lithiasis or cholecystitis.  Labs are without elevation in AST/ALT or lipase.  No evidence of pancreatitis.  She is tolerating fluids here in the emergency department.  She reports taking her medications for gastritis and is amenable to continuing this.  She will have close primary care follow-up.  Discussed reasons to return immediately to the emergency department.  Patient states understanding and is in agreement with the plan.   Gastritis, presence of bleeding unspecified, unspecified chronicity, unspecified gastritis type  Nausea and vomiting in adult patient  Vasovagal near-syncope  Hypokalemia  Epigastric abdominal pain     Calyn Rubi, Boyd Kerbs 10/03/17 0326    Zadie Rhine, MD 10/03/17 [phone]

## 2017-11-03 ENCOUNTER — Other Ambulatory Visit: Payer: Self-pay | Admitting: Family Medicine

## 2017-11-10 ENCOUNTER — Other Ambulatory Visit: Payer: Self-pay | Admitting: Family Medicine

## 2017-11-10 NOTE — Telephone Encounter (Signed)
Patient requesting Lexapro refill, medication denied in a refill encounter 11/09/17 via MyChart.  Lexapro refill Last refill:09/29/17 #26/0 refill Last OV:09/16/17 ZOX:[id number] Pharmacy: [facility] - [place], [place] - [address] [place] [phone] (Phone) [phone] (Fax)

## 2017-11-10 NOTE — Telephone Encounter (Signed)
Copied from CRM [id number]. Topic: Quick Communication - Rx Refill/Question >> Nov 10, 2017  3:41 PM Arlyss Gandy, NT wrote: Medication: escitalopram (LEXAPRO) 20 MG tablet   Has the patient contacted their pharmacy? Yes.   (Agent: If no, request that the patient contact the pharmacy for the refill.) (Agent: If yes, when and what did the pharmacy advise?)  Preferred Pharmacy (with phone number or street name): [facility] - [place], [place] - [address] [place] [phone] (Phone) [phone] (Fax)    Agent: Please be advised that RX refills may take up to 3 business days. We ask that you follow-up with your pharmacy.

## 2017-11-10 NOTE — Telephone Encounter (Signed)
Dr. Santiago please advise. Dgaddy, CMA 

## 2017-11-11 MED ORDER — ESCITALOPRAM OXALATE 20 MG PO TABS: 20.0000 mg | ORAL_TABLET | Freq: Every day | ORAL | 2 refills | Status: DC

## 2017-11-11 MED FILL — ESCITALOPRAM 20 MG TABLET: 20 | 30 days supply | Qty: 30 | Fill #0

## 2017-11-24 MED FILL — ESCITALOPRAM 20 MG TABLET: 20 | 30 days supply | Qty: 30 | Fill #0

## 2017-12-09 ENCOUNTER — Ambulatory Visit (INDEPENDENT_AMBULATORY_CARE_PROVIDER_SITE_OTHER): Payer: 59 | Admitting: Family Medicine

## 2017-12-09 DIAGNOSIS — Z30013 Encounter for initial prescription of injectable contraceptive: Secondary | ICD-10-CM | POA: Diagnosis not present

## 2018-01-02 MED FILL — ESCITALOPRAM 20 MG TABLET: 20 | 30 days supply | Qty: 30 | Fill #1

## 2018-01-08 ENCOUNTER — Other Ambulatory Visit: Payer: Self-pay | Admitting: Family Medicine

## 2018-01-09 MED ORDER — ESCITALOPRAM OXALATE 20 MG PO TABS: 20.0000 mg | ORAL_TABLET | Freq: Every day | ORAL | 0 refills | Status: DC

## 2018-01-10 ENCOUNTER — Emergency Department (HOSPITAL_COMMUNITY): Payer: 59

## 2018-01-10 ENCOUNTER — Encounter (HOSPITAL_COMMUNITY): Payer: Self-pay

## 2018-01-10 ENCOUNTER — Emergency Department (HOSPITAL_COMMUNITY)
Admission: EM | Admit: 2018-01-10 | Discharge: 2018-01-10 | Disposition: A | Discharge status: Home / Self Care | Payer: 59 | Attending: Emergency Medicine | Admitting: Emergency Medicine

## 2018-01-10 ENCOUNTER — Other Ambulatory Visit: Payer: Self-pay

## 2018-01-10 DIAGNOSIS — Z79899 Other long term (current) drug therapy: Secondary | ICD-10-CM | POA: Diagnosis not present

## 2018-01-10 DIAGNOSIS — R112 Nausea with vomiting, unspecified: Secondary | ICD-10-CM | POA: Insufficient documentation

## 2018-01-10 DIAGNOSIS — E860 Dehydration: Secondary | ICD-10-CM | POA: Insufficient documentation

## 2018-01-10 DIAGNOSIS — R1110 Vomiting, unspecified: Secondary | ICD-10-CM | POA: Diagnosis not present

## 2018-01-10 DIAGNOSIS — E876 Hypokalemia: Secondary | ICD-10-CM | POA: Diagnosis not present

## 2018-01-10 DIAGNOSIS — R1084 Generalized abdominal pain: Secondary | ICD-10-CM | POA: Insufficient documentation

## 2018-01-10 LAB — COMPREHENSIVE METABOLIC PANEL
ALBUMIN: 4.6 g/dL (ref 3.5–5.0)
ALT: 17 U/L (ref 0–44)
ANION GAP: 12 (ref 5–15)
AST: 21 U/L (ref 15–41)
Alkaline Phosphatase: 55 U/L (ref 38–126)
BUN: 11 mg/dL (ref 6–20)
CALCIUM: 9.2 mg/dL (ref 8.9–10.3)
CO2: 18 mmol/L — AB (ref 22–32)
Chloride: 108 mmol/L (ref 98–111)
Creatinine, Ser: 0.77 mg/dL (ref 0.44–1.00)
GFR calc non Af Amer: >60 mL/min (ref >60)
Glucose, Bld: 156 mg/dL — ABNORMAL HIGH (ref 70–99)
POTASSIUM: 3 mmol/L — AB (ref 3.5–5.1)
SODIUM: 138 mmol/L (ref 135–145)
TOTAL PROTEIN: 8.2 g/dL — AB (ref 6.5–8.1)
Total Bilirubin: 0.7 mg/dL (ref 0.3–1.2)

## 2018-01-10 LAB — CBC
HCT: 39.7 % (ref 36.0–46.0)
HEMOGLOBIN: 12.5 g/dL (ref 12.0–15.0)
MCH: 28.4 pg (ref 26.0–34.0)
MCHC: 31.5 g/dL (ref 30.0–36.0)
MCV: 90.2 fL (ref 80.0–100.0)
NRBC: 0 % (ref 0.0–0.2)
Platelets: 279 10*3/uL (ref 150–400)
RBC: 4.4 MIL/uL (ref 3.87–5.11)
RDW: 13.1 % (ref 11.5–15.5)
WBC: 15.5 10*3/uL — ABNORMAL HIGH (ref 4.0–10.5)

## 2018-01-10 LAB — URINALYSIS, ROUTINE W REFLEX MICROSCOPIC
Bilirubin Urine: NEGATIVE
Glucose, UA: 150 mg/dL — AB
Hgb urine dipstick: NEGATIVE
Ketones, ur: 5 mg/dL — AB
LEUKOCYTES UA: NEGATIVE
Nitrite: NEGATIVE
PROTEIN: NEGATIVE mg/dL
Specific Gravity, Urine: >1.046 — ABNORMAL HIGH (ref 1.005–1.030)
pH: 7 (ref 5.0–8.0)

## 2018-01-10 LAB — I-STAT BETA HCG BLOOD, ED (MC, WL, AP ONLY): I-stat hCG, quantitative: <5 m[IU]/mL (ref <5)

## 2018-01-10 LAB — LIPASE, BLOOD: LIPASE: 30 U/L (ref 11–51)

## 2018-01-10 MED ORDER — ONDANSETRON 8 MG PO TBDP: 8.0000 mg | ORAL_TABLET | Freq: Three times a day (TID) | ORAL | 0 refills | Status: DC | PRN

## 2018-01-10 MED ORDER — SUCRALFATE 1 G PO TABS: 1.0000 g | ORAL_TABLET | Freq: Four times a day (QID) | ORAL | 0 refills | Status: DC

## 2018-01-10 MED ORDER — ONDANSETRON HCL 4 MG/2ML IJ SOLN
4.0000 mg | Freq: Once | INTRAMUSCULAR | Status: AC
  Administered 2018-01-10: 4 mg via INTRAVENOUS
  Filled 2018-01-10: qty 2

## 2018-01-10 MED ORDER — METOCLOPRAMIDE HCL 5 MG/ML IJ SOLN
5.0000 mg | Freq: Once | INTRAMUSCULAR | Status: AC
  Administered 2018-01-10: 5 mg via INTRAVENOUS
  Filled 2018-01-10: qty 2

## 2018-01-10 MED ORDER — IOPAMIDOL (ISOVUE-300) INJECTION 61%
INTRAVENOUS | Status: AC
  Filled 2018-01-10: qty 100

## 2018-01-10 MED ORDER — SODIUM CHLORIDE 0.9 % IV BOLUS
1000.0000 mL | Freq: Once | INTRAVENOUS | Status: AC
  Administered 2018-01-10: 1000 mL via INTRAVENOUS

## 2018-01-10 MED ORDER — FAMOTIDINE IN NACL 20-0.9 MG/50ML-% IV SOLN
20.0000 mg | Freq: Once | INTRAVENOUS | Status: AC
  Administered 2018-01-10: 20 mg via INTRAVENOUS
  Filled 2018-01-10: qty 50

## 2018-01-10 MED ORDER — PANTOPRAZOLE SODIUM 40 MG IV SOLR
40.0000 mg | Freq: Once | INTRAVENOUS | Status: AC
  Administered 2018-01-10: 40 mg via INTRAVENOUS
  Filled 2018-01-10: qty 40

## 2018-01-10 MED ORDER — POTASSIUM CHLORIDE CRYS ER 20 MEQ PO TBCR
40.0000 meq | EXTENDED_RELEASE_TABLET | Freq: Once | ORAL | Status: AC
  Administered 2018-01-10: 40 meq via ORAL
  Filled 2018-01-10: qty 2

## 2018-01-10 MED ORDER — SODIUM CHLORIDE (PF) 0.9 % IJ SOLN
INTRAMUSCULAR | Status: AC
  Administered 2018-01-10: 20:00:00
  Filled 2018-01-10: qty 50

## 2018-01-10 MED ORDER — PROMETHAZINE HCL 25 MG/ML IJ SOLN
25.0000 mg | Freq: Once | INTRAMUSCULAR | Status: AC
  Administered 2018-01-10: 25 mg via INTRAVENOUS
  Filled 2018-01-10: qty 1

## 2018-01-10 MED ORDER — LORAZEPAM 2 MG/ML IJ SOLN
0.5000 mg | Freq: Once | INTRAMUSCULAR | Status: AC
  Administered 2018-01-10: 0.5 mg via INTRAVENOUS
  Filled 2018-01-10: qty 1

## 2018-01-10 MED ORDER — DIPHENHYDRAMINE HCL 50 MG/ML IJ SOLN
12.5000 mg | Freq: Once | INTRAMUSCULAR | Status: AC
  Administered 2018-01-10: 12.5 mg via INTRAVENOUS
  Filled 2018-01-10: qty 1

## 2018-01-10 MED ORDER — IOPAMIDOL (ISOVUE-300) INJECTION 61%
100.0000 mL | Freq: Once | INTRAVENOUS | Status: AC | PRN
  Administered 2018-01-10: 100 mL via INTRAVENOUS

## 2018-01-10 MED ORDER — HYDROMORPHONE HCL 1 MG/ML IJ SOLN
0.5000 mg | Freq: Once | INTRAMUSCULAR | Status: AC
  Administered 2018-01-10: 0.5 mg via INTRAVENOUS
  Filled 2018-01-10: qty 1

## 2018-01-10 NOTE — ED Notes (Signed)
Went into patient's room to offer which size in and out catheter she would like to use to collect urine specimen, per Dr Eliot FordAllen's request.  Pt states that she can ambulate to restroom but hurts and very nauseated. Pt got into restroom and dry heaving.   Messaged Dr Freida BusmanAllen via Epic for nausea/vomiting medication. Awaiting new orders.

## 2018-01-10 NOTE — ED Notes (Signed)
Pt informed that urine collection is needed  

## 2018-01-10 NOTE — ED Notes (Signed)
Pt asked to provide urine specimen. Pt states she cannot void at this time. Pt asked for pain meds and RN is aware.

## 2018-01-10 NOTE — ED Notes (Signed)
Bed: WA18 Expected date:  Expected time:  Means of arrival:  Comments: Res a 

## 2018-01-10 NOTE — ED Provider Notes (Signed)
Patient signed out to me by Dr. Particia NearingHaviland.  She had treat the patient with fluids and medications for her vomiting.  Patient also with mild hypokalemia.  After being treated here she feels better.  Will give patient oral potassium as well as Carafate and she will follow-up with her doctor.   Lorre NickAllen, Diana Cates, MD 01/10/18 2028

## 2018-01-10 NOTE — ED Provider Notes (Signed)
[facility]-EMERGENCY DEPT Provider Note   CSN: [id number] Arrival date & time: 01/10/18  1158     History   Chief Complaint Chief Complaint  Patient presents with  . Emesis    HPI Diana Thomas is a 22 y.o. female.  Pt presents to the ED today with n/v.  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  She said she was fine this morning when she woke up.  She denies pain, but feels burning in her abdomen.  She denies f/c.     Past Medical History:  Diagnosis Date  . Allergy   . Anemia   . Anxiety   . Depression   . Gastritis   . Heart murmur     Patient Active Problem List   Diagnosis Date Noted  . Generalized anxiety disorder 09/16/2017    History reviewed. No pertinent surgical history.   OB History   None      Home Medications    Prior to Admission medications   Medication Sig Start Date End Date Taking? Authorizing Provider  escitalopram (LEXAPRO) 20 MG tablet Take 1 tablet (20 mg total) by mouth daily. Office visit needed for refills 01/09/18 02/08/18 Yes Myles LippsSantiago, Irma M, MD  famotidine (PEPCID) 20 MG tablet Take 20 mg by mouth 2 (two) times daily as needed for heartburn or indigestion.   Yes [provider]  medroxyPROGESTERone (DEPO-PROVERA) 150 MG/ML injection Inject 150 mg into the muscle every 3 (three) months.   Yes [provider]  omeprazole (PRILOSEC) 20 MG capsule Take 1 capsule (20 mg total) by mouth 2 (two) times daily before a meal. Patient not taking: Reported on 01/10/2018 06/23/17   Myles LippsSantiago, Irma M, MD  sucralfate (CARAFATE) 1 g tablet Take 1 tablet (1 g total) by mouth 4 (four) times daily -  with meals and at bedtime. Patient not taking: Reported on 01/10/2018 10/03/17   Muthersbaugh, Dahlia ClientHannah, PA-C    Family History Family History  Problem Relation Age of Onset  . Heart disease Mother   . Hypertension Mother   . Hyperlipidemia Mother   . ADD / ADHD Father   .  Healthy Father   . Healthy Sister     Social History Social History   Tobacco Use  . Smoking status: Never Smoker  . Smokeless tobacco: Never Used  Substance Use Topics  . Alcohol use: Yes    Frequency: Never    Comment: occasional drinker  . Drug use: Never     Allergies   Patient has no known allergies.   Review of Systems Review of Systems  Gastrointestinal: Positive for nausea and vomiting.  All other systems reviewed and are negative.    Physical Exam Updated Vital Signs BP 125/78 (BP Location: Right Arm)   Pulse 82   Resp 18   SpO2 100%   Physical Exam  Constitutional: She is oriented to person, place, and time. She appears well-developed and well-nourished.  HENT:  Head: Normocephalic and atraumatic.  Right Ear: External ear normal.  Left Ear: External ear normal.  Nose: Nose normal.  Eyes: Pupils are equal, round, and reactive to light. Conjunctivae and EOM are normal.  Neck: Normal range of motion. Neck supple.  Cardiovascular: Normal rate, regular rhythm, normal heart sounds and intact distal pulses.  Pulmonary/Chest: Effort normal and breath sounds normal.  Abdominal: Soft. Bowel sounds are normal.  Musculoskeletal: Normal range of motion.  Neurological: She is  alert and oriented to person, place, and time.  Skin: Skin is warm. Capillary refill takes less than 2 seconds. She is diaphoretic.  Psychiatric: She has a normal mood and affect. Her behavior is normal. Judgment and thought content normal.  Nursing note and vitals reviewed.    ED Treatments / Results  Labs (all labs ordered are listed, but only abnormal results are displayed) Labs Reviewed  COMPREHENSIVE METABOLIC PANEL - Abnormal; Notable for the following components:      Result Value   Potassium 3.0 (*)    CO2 18 (*)    Glucose, Bld 156 (*)    Total Protein 8.2 (*)    All other components within normal limits  CBC - Abnormal; Notable for the following components:   WBC 15.5 (*)     All other components within normal limits  LIPASE, BLOOD  URINALYSIS, ROUTINE W REFLEX MICROSCOPIC  I-STAT BETA HCG BLOOD, ED ([facility], [facility], [facility] ONLY)    EKG None  Radiology Ct Abdomen Pelvis W Contrast  Result Date: 01/10/2018 CLINICAL DATA:  Initial evaluation for acute generalized abdominal pain, vomiting. EXAM: CT ABDOMEN AND PELVIS WITH CONTRAST TECHNIQUE: Multidetector CT imaging of the abdomen and pelvis was performed using the standard protocol following bolus administration of intravenous contrast. CONTRAST:  ISOVUE-300 IOPAMIDOL (ISOVUE-300) INJECTION 61% COMPARISON:  Prior radiograph from 10/03/2017 and CT from 06/20/2017. FINDINGS: Lower chest: No made of a 2 mm pleural base nodule at the posterior left lower lobe, of doubtful significance given patient age. Visualized lung bases are otherwise clear. Hepatobiliary: Liver demonstrates a normal contrast enhanced appearance. Gallbladder within normal limits. No biliary dilatation. Pancreas: Pancreas within normal limits. Spleen: Unremarkable. Adrenals/Urinary Tract: Adrenal glands are normal. Kidneys equal in size with symmetric enhancement no nephrolithiasis, hydronephrosis, or focal enhancing renal mass. No appreciable hydroureter. Partially distended bladder demonstrates no acute finding. Stomach/Bowel: Stomach within normal limits. No evidence for bowel obstruction. Normal appendix. Colon largely decompressed. Mild diffuse circumferential wall thickening favored to be related incomplete distension, although a degree of mild acute colitis would be difficult to exclude. Moderate retained stool within the rectal vault, suggesting constipation. No other acute inflammatory changes seen about the bowels. Vascular/Lymphatic: Normal intravascular enhancement seen throughout the intra-abdominal aorta and its branch vessels. No adenopathy. Reproductive: Uterus and ovaries within normal limits. Other: No free air or fluid. Musculoskeletal: No acute  osseous abnormality. No discrete lytic or blastic osseous lesions. IMPRESSION: 1. Mild diffuse circumferential wall thickening about the colon, favored to be related incomplete distension, although superimposed acute colitis would be difficult to exclude, and could be considered in the correct clinical setting. 2. No other acute intra-abdominal or pelvic process. Normal appendix. 3. Moderate retained stool within the distal colon/rectal vault, suggesting constipation. Electronically Signed   By: Rise Mu M.D.   On: 01/10/2018 14:55    Procedures Procedures (including critical care time)  Medications Ordered in ED Medications  iopamidol (ISOVUE-300) 61 % injection (has no administration in time range)  sodium chloride (PF) 0.9 % injection (has no administration in time range)  ondansetron (ZOFRAN) injection 4 mg (4 mg Intravenous Given 01/10/18 1237)  sodium chloride 0.9 % bolus 1,000 mL (1,000 mLs Intravenous New Bag/Given (Non-Interop) 01/10/18 1237)  promethazine (PHENERGAN) injection 25 mg (25 mg Intravenous Given 01/10/18 1324)  iopamidol (ISOVUE-300) 61 % injection 100 mL (100 mLs Intravenous Contrast Given 01/10/18 1438)  famotidine (PEPCID) IVPB 20 mg premix (20 mg Intravenous New Bag/Given (Non-Interop) 01/10/18 1547)  Initial Impression / Assessment and Plan / ED Course  I have reviewed the triage vital signs and the nursing notes.  Pertinent labs & imaging results that were available during my care of the patient were reviewed by me and considered in my medical decision making (see chart for details).    Pt is still nauseous and feels burning in her stomach.  She wants to try pepcid.  She was given additional fluids.  UA is pending.  Pt signed out to Dr. Freida Busman at shift change.  Final Clinical Impressions(s) / ED Diagnoses   Final diagnoses:  Dehydration  Nausea and vomiting, intractability of vomiting not specified, unspecified vomiting type    ED Discharge  Orders    None       Jacalyn Lefevre, MD 01/10/18 [phone]

## 2018-01-10 NOTE — ED Notes (Addendum)
Pt c/o nausea and stomach burning. Informed pt will let EDP know.  Sent Dr Freida BusmanAllen message in Epic with pt request. Awaiting for new orders.

## 2018-01-10 NOTE — ED Triage Notes (Signed)
Pt presents from work with c/o vomiting. Pt reports she was not feeling bad this morning but started feeling bad while working. Pt is pale, diaphoretic, and vomiting.

## 2018-01-11 MED FILL — ONDANSETRON ODT 8 MG TABLET: 8 | 6 days supply | Qty: 20 | Fill #0

## 2018-01-11 MED FILL — SUCRALFATE 1 GM TABLET: 1 | 7 days supply | Qty: 30 | Fill #0

## 2018-02-09 ENCOUNTER — Ambulatory Visit (INDEPENDENT_AMBULATORY_CARE_PROVIDER_SITE_OTHER): Payer: 59 | Admitting: Physician Assistant

## 2018-02-09 ENCOUNTER — Encounter: Payer: Self-pay | Admitting: Physician Assistant

## 2018-02-09 ENCOUNTER — Other Ambulatory Visit: Payer: Self-pay

## 2018-02-09 ENCOUNTER — Telehealth: Payer: Self-pay | Admitting: Family Medicine

## 2018-02-09 VITALS — BP 115/68 | HR 63 | Temp 98.6°F | Ht 64.0 in | Wt 137.2 lb

## 2018-02-09 DIAGNOSIS — R109 Unspecified abdominal pain: Secondary | ICD-10-CM

## 2018-02-09 DIAGNOSIS — R112 Nausea with vomiting, unspecified: Secondary | ICD-10-CM

## 2018-02-09 LAB — POCT CBC
Granulocyte percent: 65.1 %G (ref 37–80)
HCT, POC: 38.7 % (ref 29–41)
Hemoglobin: 13.2 g/dL (ref 11–14.6)
Lymph, poc: 3.5 — AB (ref 0.6–3.4)
MCH, POC: 29 pg (ref 27–31.2)
MCHC: 34 g/dL (ref 31.8–35.4)
MCV: 85.7 fL (ref 76–111)
MID (cbc): 0.4 (ref 0–0.9)
MPV: 8.1 fL (ref 0–99.8)
POC Granulocyte: 7.3 — AB (ref 2–6.9)
POC LYMPH PERCENT: 30.9 % (ref 10–50)
POC MID %: 4 %M (ref 0–12)
Platelet Count, POC: 234 10*3/uL (ref 142–424)
RBC: 4.53 M/uL (ref 4.04–5.48)
RDW, POC: 12.9 %
WBC: 11.2 10*3/uL — AB (ref 4.6–10.2)

## 2018-02-09 LAB — POCT URINALYSIS DIP (MANUAL ENTRY)
Blood, UA: NEGATIVE
Glucose, UA: NEGATIVE mg/dL
Leukocytes, UA: NEGATIVE
Nitrite, UA: NEGATIVE
Protein Ur, POC: 30 mg/dL — AB
Spec Grav, UA: 1.02 (ref 1.010–1.025)
Urobilinogen, UA: 1 U/dL
pH, UA: 6.5 (ref 5.0–8.0)

## 2018-02-09 MED ORDER — ALUM & MAG HYDROXIDE-SIMETH 200-200-20 MG/5ML PO SUSP: 30.0000 mL | Freq: Once | ORAL | Status: DC

## 2018-02-09 MED ORDER — ONDANSETRON 8 MG PO TBDP: 8.0000 mg | ORAL_TABLET | Freq: Three times a day (TID) | ORAL | 0 refills | Status: DC | PRN

## 2018-02-09 MED ORDER — SUCRALFATE 1 G PO TABS: 1.0000 g | ORAL_TABLET | Freq: Three times a day (TID) | ORAL | 0 refills | Status: DC

## 2018-02-09 MED FILL — ONDANSETRON ODT 8 MG TABLET: 8 | 7 days supply | Qty: 20 | Fill #0

## 2018-02-09 MED FILL — SUCRALFATE 1 GM TABLET: 1 | 8 days supply | Qty: 30 | Fill #0

## 2018-02-09 MED FILL — ESCITALOPRAM 20 MG TABLET: 20 | 30 days supply | Qty: 30 | Fill #0

## 2018-02-09 NOTE — Telephone Encounter (Signed)
Pt. Was in office seen by McVey on 02/09/18. Per Mcvey pt. Needed to follow up with her primary care in one week. A hold has been placed on Dr. Adela GlimpseSantiago's schedule at 9:20 am on 02/21/18. Please override this visit to schedule the pt. Per ms. mcvey

## 2018-02-09 NOTE — Patient Instructions (Addendum)
You will receive a phone call (in 1-2 weeks) to schedule an appointment with GI for endoscopy Stick to a bland diet.  Drink several tablespoons of water every 15-30 min until you can slowly increase your intake.   Follow these instructions at home: Medicines  Take over-the-counter and prescription medicines only as told by your doctor.  If you were prescribed an antibiotic medicine, take it as told by your doctor. Do not stop taking it even if you start to feel better. Eating and drinking   Eat small meals often, instead of large meals.  Avoid foods and drinks that make your symptoms worse.  Drink enough fluid to keep your pee (urine) pale yellow. Alcohol use  Do not drink alcohol if: ? Your doctor tells you not to drink. ? You are pregnant, may be pregnant, or are planning to become pregnant.  If you drink alcohol: ? Limit your use to:  0-1 drink a day for women.  0-2 drinks a day for men. ? Be aware of how much alcohol is in your drink. In the [place] one drink equals one 12 oz bottle of beer (355 mL), one 5 oz glass of wine (148 mL), or one 1 oz glass of hard liquor (44 mL). General instructions  Talk with your doctor about ways to manage stress. You can exercise or do deep breathing, meditation, or yoga.  Do not smoke or use products that have nicotine or tobacco. If you need help quitting, ask your doctor.  Keep all follow-up visits as told by your doctor. This is important. Contact a doctor if:  Your symptoms get worse.  Your symptoms go away and then come back. Get help right away if:  You throw up blood or something that looks like coffee grounds.  You have black or dark red poop.  You throw up any time you try to drink fluids.  Your stomach pain gets worse.  You have a fever.  You do not feel better after one week. Summary  Gastritis is swelling (inflammation) of the stomach.  You must get help for this condition. If you do not get help, your  stomach can bleed, and you can get sores (ulcers).  This condition is diagnosed with medical history, physical exam, or tests.  You can be treated with medicines for germs or medicines to block too much acid in your stomach. This information is not intended to replace advice given to you by your health care provider. Make sure you discuss any questions you have with your health care provider. Document Released: 07/21/2007 Document Revised: 06/21/2017 Document Reviewed: 06/21/2017 Elsevier Interactive Patient Education  2019 [employer]

## 2018-02-09 NOTE — Progress Notes (Signed)
Diana Thomas  MRN: [id number] DOB: Apr 28, 1995  PCP: Rutherford Guys, MD  Subjective:  Pt is a 22 year old female who presents to clinic for vomiting and abdominal pain.  She is throwing up x 5 days. "I can't keep anything down". Endorses nausea and epigastric burning.   She has h/o gastritis x 1 year and has presented to the ED twice for this problem.  She has been taking Pepcid, carafate, phenergan. She has not taken PPI.  Negative H Pylori test 06/2017.  She has not yet had endoscopy. She denies any chance she is pregnant.   Previous imaging:   CT abdomen 01/10/2018 IMPRESSION: 1. Mild diffuse circumferential wall thickening about the colon, favored to be related incomplete distension, although superimposed acute colitis would be difficult to exclude, and could be considered in the correct clinical setting. 2. No other acute intra-abdominal or pelvic process. Normal appendix. 3. Moderate retained stool within the distal colon/rectal vault, suggesting constipation.  DG abdomen 10/03/2017 IMPRESSION: Negative abdominal radiographs.  No acute cardiopulmonary disease.  Review of Systems  Constitutional: Positive for appetite change (decreased) and fatigue. Negative for chills, diaphoresis and fever.  Gastrointestinal: Positive for abdominal pain, nausea and vomiting. Negative for diarrhea.    Patient Active Problem List   Diagnosis Date Noted  . Generalized anxiety disorder 09/16/2017    Current Outpatient Medications on File Prior to Visit  Medication Sig Dispense Refill  . escitalopram (LEXAPRO) 20 MG tablet Take 1 tablet (20 mg total) by mouth daily. Office visit needed for refills 30 tablet 0  . famotidine (PEPCID) 20 MG tablet Take 20 mg by mouth 2 (two) times daily as needed for heartburn or indigestion.    . medroxyPROGESTERone (DEPO-PROVERA) 150 MG/ML injection Inject 150 mg into the muscle every 3 (three) months.    . ondansetron (ZOFRAN ODT) 8 MG disintegrating  tablet Take 1 tablet (8 mg total) by mouth every 8 (eight) hours as needed for nausea or vomiting. 20 tablet 0  . sucralfate (CARAFATE) 1 g tablet Take 1 tablet (1 g total) by mouth 4 (four) times daily -  with meals and at bedtime. 30 tablet 0   Current Facility-Administered Medications on File Prior to Visit  Medication Dose Route Frequency Provider Last Rate Last Dose  . medroxyPROGESTERone Acetate SUSY 150 mg  150 mg Intramuscular Q90 days Pamella Pert, Irma M, MD   150 mg at 12/09/17 4174    No Known Allergies   Objective:  BP 115/68 (BP Location: Right Arm, Patient Position: Sitting, Cuff Size: Normal)   Pulse 63   Temp 98.6 F (37 C) (Oral)   Ht 5' 4"  (1.626 m)   Wt 137 lb 3.2 oz (62.2 kg)   LMP 01/30/2018   SpO2 98%   BMI 23.55 kg/m   Physical Exam Vitals signs reviewed.  Constitutional:      General: She is not in acute distress. Cardiovascular:     Rate and Rhythm: Normal rate and regular rhythm.     Heart sounds: Normal heart sounds.  Abdominal:     General: Abdomen is flat. Bowel sounds are normal. There is no distension.     Palpations: Abdomen is soft.     Tenderness: There is no abdominal tenderness. There is no guarding or rebound.  Skin:    General: Skin is warm and dry.  Neurological:     Mental Status: She is alert and oriented to person, place, and time.  Psychiatric:  Judgment: Judgment normal.    Results for orders placed or performed in visit on 02/09/18  POCT CBC  Result Value Ref Range   WBC 11.2 (A) 4.6 - 10.2 K/uL   Lymph, poc 3.5 (A) 0.6 - 3.4   POC LYMPH PERCENT 30.9 10 - 50 %L   MID (cbc) 0.4 0 - 0.9   POC MID % 4.0 0 - 12 %M   POC Granulocyte 7.3 (A) 2 - 6.9   Granulocyte percent 65.1 37 - 80 %G   RBC 4.53 4.04 - 5.48 M/uL   Hemoglobin 13.2 11 - 14.6 g/dL   HCT, POC 38.7 29 - 41 %   MCV 85.7 76 - 111 fL   MCH, POC 29.0 27 - 31.2 pg   MCHC 34.0 31.8 - 35.4 g/dL   RDW, POC 12.9 %   Platelet Count, POC 234 142 - 424 K/uL   MPV  8.1 0 - 99.8 fL  POCT urinalysis dipstick  Result Value Ref Range   Color, UA yellow yellow   Clarity, UA clear clear   Glucose, UA negative negative mg/dL   Bilirubin, UA small (A) negative   Ketones, POC UA trace (5) (A) negative mg/dL   Spec Grav, UA 1.020 1.010 - 1.025   Blood, UA negative negative   pH, UA 6.5 5.0 - 8.0   Protein Ur, POC =30 (A) negative mg/dL   Urobilinogen, UA 1.0 0.2 or 1.0 E.U./dL   Nitrite, UA Negative Negative   Leukocytes, UA Negative Negative    Assessment and Plan :  1. Abdominal pain, unspecified abdominal location - Pt presents c/o gastritis. This is not a new problem for her. Unable to obtain IV access to administer fluids today. mild improvement after GI cocktail. She is in NAD and is not ill appearing. Vitals are stable. WBC count slightly elevated at 11.2. CMP and lipase are pending. Advised pt slow rehydration and bland diet. Advised pt to present to ED if symptoms worsen. Plan to have evaluation by GI as this is a recurring problem for her. F/u with PCP next week to assess improvement. She understands and agrees with plan.  - Lipase - Ambulatory referral to Gastroenterology - sucralfate (CARAFATE) 1 g tablet; Take 1 tablet (1 g total) by mouth 4 (four) times daily -  with meals and at bedtime.  Dispense: 30 tablet; Refill: 0  2. Nausea and vomiting, intractability of vomiting not specified, unspecified vomiting type - POCT CBC - CMP14+EGFR - POCT urinalysis dipstick - ondansetron (ZOFRAN ODT) 8 MG disintegrating tablet; Take 1 tablet (8 mg total) by mouth every 8 (eight) hours as needed for nausea or vomiting.  Dispense: 20 tablet; Refill: 0 - alum & mag hydroxide-simeth (MAALOX/MYLANTA) 200-200-20 MG/5ML suspension 30 mL - Ambulatory referral to Gastroenterology   Mercer Pod, PA-C  [facility] 02/09/2018 11:59 AM  Please note: Portions of this report may have been transcribed using dragon voice  recognition software. Every effort was made to ensure accuracy; however, inadvertent computerized transcription errors may be present.

## 2018-02-10 LAB — CMP14+EGFR
ALT: 23 IU/L (ref 0–32)
AST: 40 IU/L (ref 0–40)
Albumin/Globulin Ratio: 1.5 (ref 1.2–2.2)
Albumin: 4.8 g/dL (ref 3.5–5.5)
Alkaline Phosphatase: 64 IU/L (ref 39–117)
BUN/Creatinine Ratio: 12 (ref 9–23)
BUN: 10 mg/dL (ref 6–20)
Bilirubin Total: 0.4 mg/dL (ref 0.0–1.2)
CO2: 20 mmol/L (ref 20–29)
Calcium: 9.9 mg/dL (ref 8.7–10.2)
Chloride: 102 mmol/L (ref 96–106)
Creatinine, Ser: 0.85 mg/dL (ref 0.57–1.00)
GFR calc Af Amer: 111 mL/min/{1.73_m2} (ref >59)
GFR calc non Af Amer: 96 mL/min/{1.73_m2} (ref >59)
Globulin, Total: 3.1 g/dL (ref 1.5–4.5)
Glucose: 90 mg/dL (ref 65–99)
Potassium: 4.9 mmol/L (ref 3.5–5.2)
Sodium: 137 mmol/L (ref 134–144)
Total Protein: 7.9 g/dL (ref 6.0–8.5)

## 2018-02-10 LAB — LIPASE: Lipase: 24 U/L (ref 14–72)

## 2018-02-13 ENCOUNTER — Encounter: Payer: Self-pay | Admitting: Family Medicine

## 2018-02-20 ENCOUNTER — Telehealth: Payer: Self-pay | Admitting: Family Medicine

## 2018-02-20 NOTE — Telephone Encounter (Signed)
Patient needs [name] forms completed for her trips to the ER this past year. I was not sure how to complete to forms so I have left them blank. The patient does have an appointment on 02/21/18 so I will place the forms in Dr Adela Glimpse box on 02/20/18 please complete the forms and return them to the [name]/Disability box at the checkout desk within 5-7 business days. Thank you!

## 2018-02-21 ENCOUNTER — Ambulatory Visit (INDEPENDENT_AMBULATORY_CARE_PROVIDER_SITE_OTHER): Payer: 59 | Admitting: Family Medicine

## 2018-02-21 ENCOUNTER — Other Ambulatory Visit: Payer: Self-pay

## 2018-02-21 ENCOUNTER — Encounter: Payer: Self-pay | Admitting: Family Medicine

## 2018-02-21 VITALS — BP 110/70 | HR 80 | Temp 99.1°F | Ht 64.0 in | Wt 140.0 lb

## 2018-02-21 DIAGNOSIS — R109 Unspecified abdominal pain: Secondary | ICD-10-CM

## 2018-02-21 DIAGNOSIS — F411 Generalized anxiety disorder: Secondary | ICD-10-CM

## 2018-02-21 DIAGNOSIS — R112 Nausea with vomiting, unspecified: Secondary | ICD-10-CM | POA: Diagnosis not present

## 2018-02-21 LAB — POCT URINALYSIS DIP (MANUAL ENTRY)
Bilirubin, UA: NEGATIVE
Blood, UA: NEGATIVE
Glucose, UA: NEGATIVE mg/dL
Ketones, POC UA: NEGATIVE mg/dL
Leukocytes, UA: NEGATIVE
Nitrite, UA: NEGATIVE
Protein Ur, POC: NEGATIVE mg/dL
Spec Grav, UA: 1.02 (ref 1.010–1.025)
Urobilinogen, UA: 0.2 E.U./dL
pH, UA: 6 (ref 5.0–8.0)

## 2018-02-21 MED ORDER — ESCITALOPRAM OXALATE 20 MG PO TABS: 20.0000 mg | ORAL_TABLET | Freq: Every day | ORAL | 1 refills | Status: DC

## 2018-02-21 MED ORDER — SUCRALFATE 1 G PO TABS: 1.0000 g | ORAL_TABLET | Freq: Three times a day (TID) | ORAL | 0 refills | Status: DC

## 2018-02-21 NOTE — Telephone Encounter (Signed)
Paperwork completed scanned and faxed on 02/21/18

## 2018-02-21 NOTE — Patient Instructions (Signed)
° ° ° °  If you have lab work done today you will be contacted with your lab results within the next 2 weeks.  If you have not heard from us then please contact us. The fastest way to get your results is to register for My Chart. ° ° °IF you received an x-ray today, you will receive an invoice from [facility]. Please contact [facility] at [phone] with questions or concerns regarding your invoice.  ° °IF you received labwork today, you will receive an invoice from [facility]. Please contact [facility] at [phone] with questions or concerns regarding your invoice.  ° °Our billing staff will not be able to assist you with questions regarding bills from these companies. ° °You will be contacted with the lab results as soon as they are available. The fastest way to get your results is to activate your My Chart account. Instructions are located on the last page of this paperwork. If you have not heard from us regarding the results in 2 weeks, please contact this office. °  ° ° ° °

## 2018-02-21 NOTE — Progress Notes (Signed)
1/7/202010:24 AM  Diana BandyKierra A Thomas 1995-08-07, 23 y.o. female [id number]  Chief Complaint  Patient presents with  . Follow-up    no longer vomiting, still feeling the pain in stomach. Yet to see GI doctor. Waiting on the call.    HPI:   Patient is a 23 y.o. female with past medical history significant for recurrent abd pain and vomiting who presents today requesting [name] paperwork  Patient for past year has been having recurring episodes of epigastric abd pain with nausea and vomiting Episodes last 3-4 days at a time, about once a month, started about a year ago Today doing ok Doing bland diet, has stopped caffeine, has been changing to less acidic juices, does not do nsaids, denies etoh, does not smoke Still on pepcid bid and carafate tid Has tried PPI in the past, feels she does better on pepcid Can tell when she is due for her next dose Sleeping on 2-3 pillows if not feels reflux  Still having issues with constipation, taking miralax and biscoydyl prn When feeling sick she will however have dark loose stools, not black or tarry, never seen bright red blood, having BM helps some with pain Has had intermittent dark blood with vomiting towards end of significant episodes of recurring emesis Denies any fever or chills appettite and weight stable Denies any FHx IBD or cancers  Works as a Engineer, civil (consulting)nurse  Chart review: Seen in ED 01/10/18, 10/02/17 Last seen by us on 02/09/18 Negative h pylori in may 2019 Ct abd/pelvis in nov 2019: possible colitis, constipation Labs normal except for mild hypokalemia, which resolved on recheck  Will be seeing GI in 2 days  Otherwise anxiety doing well on lexapro Denies any side effects Needs refill today  Fall Risk  02/21/2018 02/09/2018 09/16/2017 06/23/2017  Falls in the past year? 0 0 No No     Dep[facility] screen [facility] 2/9 02/21/2018 02/09/2018 09/16/2017  Decreased Interest 1 0 0  Down, Depressed, Hopeless 1 2 0  PHQ - 2 Score 2 2 0  Altered  sleeping 1 0 -  Tired, decreased energy 2 0 -  Change in appetite 2 3 -  Feeling bad or failure about yourself  2 3 -  Trouble concentrating 0 0 -  Moving slowly or fidgety/restless 0 0 -  Suicidal thoughts 0 0 -  PHQ-9 Score 9 8 -  Difficult doing work/chores - Very difficult -    No Known Allergies  Prior to Admission medications   Medication Sig Start Date End Date Taking? Authorizing Provider  famotidine (PEPCID) 20 MG tablet Take 20 mg by mouth 2 (two) times daily as needed for heartburn or indigestion.   Yes [provider]  medroxyPROGESTERone (DEPO-PROVERA) 150 MG/ML injection Inject 150 mg into the muscle every 3 (three) months.   Yes [provider]  ondansetron (ZOFRAN ODT) 8 MG disintegrating tablet Take 1 tablet (8 mg total) by mouth every 8 (eight) hours as needed for nausea or vomiting. 02/09/18  Yes McVey, Madelaine BhatElizabeth Whitney, PA-C  sucralfate (CARAFATE) 1 g tablet Take 1 tablet (1 g total) by mouth 4 (four) times daily -  with meals and at bedtime. 02/09/18  Yes McVey, Madelaine BhatElizabeth Whitney, PA-C  escitalopram (LEXAPRO) 20 MG tablet Take 1 tablet (20 mg total) by mouth daily. Office visit needed for refills 01/09/18 02/09/18  Myles LippsSantiago, Zadyn Yardley M, MD    Past Medical History:  Diagnosis Date  . Allergy   . Anemia   . Anxiety   .  Depression   . Gastritis   . Heart murmur     History reviewed. No pertinent surgical history.  Social History   Tobacco Use  . Smoking status: Never Smoker  . Smokeless tobacco: Never Used  Substance Use Topics  . Alcohol use: Yes    Frequency: Never    Comment: occasional drinker    Family History  Problem Relation Age of Onset  . Heart disease Mother   . Hypertension Mother   . Hyperlipidemia Mother   . ADD / ADHD Father   . Healthy Father   . Healthy Sister     ROS Per hpi  OBJECTIVE:  Blood pressure 110/70, pulse 80, temperature 99.1 F (37.3 C), height 5\' 4"  (1.626 m), weight 140 lb (63.5 kg), last  menstrual period 01/30/2018, SpO2 100 %. Body mass index is 24.03 kg/m.   Wt Readings from Last 3 Encounters:  02/21/18 140 lb (63.5 kg)  02/09/18 137 lb 3.2 oz (62.2 kg)  10/02/17 144 lb (65.3 kg)    Physical Exam Vitals signs and nursing note reviewed.  Constitutional:      Appearance: She is well-developed.  HENT:     Head: Normocephalic and atraumatic.     Mouth/Throat:     Pharynx: No oropharyngeal exudate.  Eyes:     General: No scleral icterus.    Conjunctiva/sclera: Conjunctivae normal.     Pupils: Pupils are equal, round, and reactive to light.  Neck:     Musculoskeletal: Neck supple.  Cardiovascular:     Rate and Rhythm: Normal rate and regular rhythm.     Heart sounds: Normal heart sounds. No murmur. No friction rub. No gallop.   Pulmonary:     Effort: Pulmonary effort is normal.     Breath sounds: Normal breath sounds. No wheezing or rales.  Abdominal:     General: Bowel sounds are normal. There is no distension.     Palpations: Abdomen is soft. There is no mass.     Tenderness: There is abdominal tenderness (epigastric). There is no guarding or rebound.  Skin:    General: Skin is warm and dry.  Neurological:     Mental Status: She is alert and oriented to person, place, and time.       Results for orders placed or performed in visit on 02/21/18 (from the past 24 hour(s))  POCT urinalysis dipstick     Status: None   Collection Time: 02/21/18  9:34 AM  Result Value Ref Range   Color, UA yellow yellow   Clarity, UA clear clear   Glucose, UA negative negative mg/dL   Bilirubin, UA negative negative   Ketones, POC UA negative negative mg/dL   Spec Grav, UA [id number] 1.4781.010 - 1.025   Blood, UA negative negative   pH, UA 6.0 5.0 - 8.0   Protein Ur, POC negative negative mg/dL   Urobilinogen, UA 0.2 0.2 or 1.0 E.U./dL   Nitrite, UA Negative Negative   Leukocytes, UA Negative Negative     ASSESSMENT and PLAN  1. Abdominal pain, unspecified abdominal  location 2. Nausea and vomiting, intractability of vomiting not specified, unspecified vomiting type Unclear diagnosis, needs further workup at this time, has upcoming appt with GI for further eval and treatment. [name] paperwork will be completed given recurring episodes, 4 days once a month - POCT urinalysis dipstick - sucralfate (CARAFATE) 1 g tablet; Take 1 tablet (1 g total) by mouth 4 (four) times daily -  with meals and at bedtime.  3. Generalized anxiety disorder Controlled. Continue current regime.  - escitalopram (LEXAPRO) 20 MG tablet; Take 1 tablet (20 mg total) by mouth daily.    Return for after GI.    Myles Lipps, MD [facility] [address] [place], [place] [address] Ph.  [phone] Fax [phone]

## 2018-02-22 ENCOUNTER — Telehealth: Payer: Self-pay | Admitting: Family Medicine

## 2018-02-22 NOTE — Telephone Encounter (Signed)
Copied from CRM [id number]. Topic: General - Other >> Feb 22, 2018  2:50 PM Leafy Roobinson, Norma J wrote: Reason for CRM: pt dropped off [name] paperwork yesterday to dr Leretha Polsantiago. Pt would like to pick up [name] when ready. Pt is aware can take 7 to 10 business days

## 2018-02-23 ENCOUNTER — Ambulatory Visit: Payer: 59 | Admitting: Gastroenterology

## 2018-02-23 NOTE — Telephone Encounter (Signed)
Forms have already been completed and faxed to employer. Patient can pick up copy anytime

## 2018-02-23 NOTE — Telephone Encounter (Signed)
Did you recieve

## 2018-02-27 ENCOUNTER — Other Ambulatory Visit (INDEPENDENT_AMBULATORY_CARE_PROVIDER_SITE_OTHER): Payer: 59

## 2018-02-27 ENCOUNTER — Encounter: Payer: Self-pay | Admitting: Gastroenterology

## 2018-02-27 ENCOUNTER — Ambulatory Visit (INDEPENDENT_AMBULATORY_CARE_PROVIDER_SITE_OTHER): Payer: 59 | Admitting: Gastroenterology

## 2018-02-27 VITALS — BP 98/52 | HR 64 | Ht 64.0 in | Wt 142.1 lb

## 2018-02-27 DIAGNOSIS — R194 Change in bowel habit: Secondary | ICD-10-CM | POA: Diagnosis not present

## 2018-02-27 DIAGNOSIS — R112 Nausea with vomiting, unspecified: Secondary | ICD-10-CM | POA: Diagnosis not present

## 2018-02-27 DIAGNOSIS — R1013 Epigastric pain: Secondary | ICD-10-CM

## 2018-02-27 DIAGNOSIS — R933 Abnormal findings on diagnostic imaging of other parts of digestive tract: Secondary | ICD-10-CM | POA: Diagnosis not present

## 2018-02-27 DIAGNOSIS — Z0271 Encounter for disability determination: Secondary | ICD-10-CM

## 2018-02-27 DIAGNOSIS — K219 Gastro-esophageal reflux disease without esophagitis: Secondary | ICD-10-CM | POA: Diagnosis not present

## 2018-02-27 LAB — CBC WITH DIFFERENTIAL/PLATELET
BASOS ABS: 0.1 10*3/uL (ref 0.0–0.1)
BASOS PCT: 0.6 % (ref 0.0–3.0)
Eosinophils Absolute: 0.2 10*3/uL (ref 0.0–0.7)
Eosinophils Relative: 2.6 % (ref 0.0–5.0)
HCT: 38.7 % (ref 36.0–46.0)
Hemoglobin: 12.7 g/dL (ref 12.0–15.0)
LYMPHS ABS: 2.4 10*3/uL (ref 0.7–4.0)
Lymphocytes Relative: 29.4 % (ref 12.0–46.0)
MCHC: 32.8 g/dL (ref 30.0–36.0)
MCV: 86.6 fl (ref 78.0–100.0)
MONOS PCT: 7.1 % (ref 3.0–12.0)
Monocytes Absolute: 0.6 10*3/uL (ref 0.1–1.0)
NEUTROS ABS: 4.9 10*3/uL (ref 1.4–7.7)
NEUTROS PCT: 60.3 % (ref 43.0–77.0)
PLATELETS: 219 10*3/uL (ref 150.0–400.0)
RBC: 4.47 Mil/uL (ref 3.87–5.11)
RDW: 13.3 % (ref 11.5–15.5)
WBC: 8.2 10*3/uL (ref 4.0–10.5)

## 2018-02-27 MED ORDER — PANTOPRAZOLE SODIUM 40 MG PO TBEC: 40.0000 mg | DELAYED_RELEASE_TABLET | Freq: Every day | ORAL | 3 refills | Status: DC

## 2018-02-27 NOTE — Patient Instructions (Addendum)
If you are age 25 or older, your body mass index should be between 23-30. Your Body mass index is 24.4 kg/m. If this is out of the aforementioned range listed, please consider follow up with your Primary Care Provider.  If you are age 70 or younger, your body mass index should be between 19-25. Your Body mass index is 24.4 kg/m. If this is out of the aformentioned range listed, please consider follow up with your Primary Care Provider.   You have been scheduled for a colonoscopy. Please follow written instructions given to you at your visit today.  Please pick up your prep supplies at the pharmacy within the next 1-3 days. If you use inhalers (even only as needed), please bring them with you on the day of your procedure. Your physician has requested that you go to www.startemmi.com and enter the access code given to you at your visit today. This web site gives a general overview about your procedure. However, you should still follow specific instructions given to you by our office regarding your preparation for the procedure.  We have sent the following medications to your pharmacy for you to pick up at your convenience: Protonix 40mg : Take 1 to 2 times daily  Continue Carafate  Discontinue Pepcid.   Please go to the lab in the basement of our building to have lab work done as you leave today. Hit "B" for basement when you get on the elevator.  When the doors open the lab is on your left.  We will call you with the results. Thank you.  Please take an over the counter daily fiber supplement.  Thank you for entrusting me with your care and for choosing [facility], Dr. Ileene Patrick

## 2018-02-27 NOTE — Progress Notes (Signed)
HPI :  23 year old female with a history of anxiety, GERD, abdominal pain, referred here by Newt Minion for abdominal pain, nausea / vomiting, altered bowel habits.  The patient reports about a year ago she started having multiple upper tract symptoms. She experienced pyrosis and regurgitation. Also with some intermittent epigastric pain. She has nausea that can bother her sporadically, often after eating, and has had occasional vomiting. Her epigastric discomfort is frequently bothers her daily, has a burning sensation which comes and goes. She is been in the emergency room twice for these symptoms when they were flaring associated with significant vomiting.   She otherwise reports some altered bowel habits. Sometimes she has normally constipated stools, and can average about 3 bowel movements per week. At other times she will have abdominal cramps and urgency associated with loose stools. This may occur a few times a week. She denies any blood in her stools. She denies any tobacco use. Uses alcohol rarely, this has been found to worsen her symptoms. She uses marijuana infrequently. She denies any NSAID use. She has no family history of Crohn's disease or colitis. She's been on Lexapro for the past year or so.  She has been using Pepcid for her symptoms which helps take the edge off but does not resolve it. She is using it twice a day. She was using a trial of low-dose Prilosec in the past but did not notice any difference. She has been using Carafate which helps her epigastric discomfort when she takes it.  She had white blood cell count of 15 and 17 during her visits to the ED, without anemia. Other workup as below. Prior workup thus far: H pylori breath test negative 06/23/17  US abdomen 10/03/17 - normal  CT abdomen / pelvis 01/10/18 - mild diffuse circumferential colonic wall thickening, difficult to exclude colitis  Past Medical History:  Diagnosis Date  . Allergy   . Anemia     . Anxiety   . Depression   . Gastritis   . Heart murmur      Past Surgical History:  Procedure Laterality Date  . NO PAST SURGERIES     Family History  Problem Relation Age of Onset  . Heart disease Mother   . Hypertension Mother   . Hyperlipidemia Mother   . ADD / ADHD Father   . Healthy Father   . Healthy Sister   . Liver disease Maternal Grandmother   . Diabetes Maternal Uncle    Social History   Tobacco Use  . Smoking status: Never Smoker  . Smokeless tobacco: Never Used  Substance Use Topics  . Alcohol use: Yes    Frequency: Never    Comment: occasional drinker  . Drug use: Never   Current Outpatient Medications  Medication Sig Dispense Refill  . escitalopram (LEXAPRO) 20 MG tablet Take 1 tablet (20 mg total) by mouth daily. 90 tablet 1  . famotidine (PEPCID) 20 MG tablet Take 20 mg by mouth 2 (two) times daily as needed for heartburn or indigestion.    . medroxyPROGESTERone (DEPO-PROVERA) 150 MG/ML injection Inject 150 mg into the muscle every 3 (three) months.    . ondansetron (ZOFRAN ODT) 8 MG disintegrating tablet Take 1 tablet (8 mg total) by mouth every 8 (eight) hours as needed for nausea or vomiting. 20 tablet 0  . sucralfate (CARAFATE) 1 g tablet Take 1 tablet (1 g total) by mouth 4 (four) times daily -  with meals and at bedtime. 30  tablet 0   Current Facility-Administered Medications  Medication Dose Route Frequency Provider Last Rate Last Dose  . alum & mag hydroxide-simeth (MAALOX/MYLANTA) 200-200-20 MG/5ML suspension 30 mL  30 mL Oral Once McVey, Madelaine Bhat, PA-C      . medroxyPROGESTERone Acetate SUSY 150 mg  150 mg Intramuscular Q90 days Leretha Pol, Irma M, MD   150 mg at 12/09/17 1497   No Known Allergies   Review of Systems: All systems reviewed and negative except where noted in HPI.   Lab Results  Component Value Date   WBC 8.2 02/27/2018   HGB 12.7 02/27/2018   HCT 38.7 02/27/2018   MCV 86.6 02/27/2018   PLT 219.0 02/27/2018     Lab Results  Component Value Date   CREATININE 0.85 02/09/2018   BUN 10 02/09/2018   NA 137 02/09/2018   K 4.9 02/09/2018   CL 102 02/09/2018   CO2 20 02/09/2018   Lab Results  Component Value Date   ALT 23 02/09/2018   AST 40 02/09/2018   ALKPHOS 64 02/09/2018   BILITOT 0.4 02/09/2018     Physical Exam: Ht 5\' 4"  (1.626 m) Comment: height measured without shoes  Wt 142 lb 2 oz (64.5 kg)   LMP 01/30/2018   BMI 24.40 kg/m  Constitutional: Pleasant,well-developed, female in no acute distress. HEENT: Normocephalic and atraumatic. Conjunctivae are normal. No scleral icterus. Neck supple.  Cardiovascular: Normal rate, regular rhythm.  Pulmonary/chest: Effort normal and breath sounds normal. No wheezing, rales or rhonchi. Abdominal: Soft, nondistended, nontender.  There are no masses palpable. No hepatomegaly. Extremities: no edema Lymphadenopathy: No cervical adenopathy noted. Neurological: Alert and oriented to person place and time. Skin: Skin is warm and dry. No rashes noted. Psychiatric: Normal mood and affect. Behavior is normal.   ASSESSMENT AND PLAN: 23 year old female here for new patient consultation regarding following:  Nausea and vomiting / GERD / epigastric pain - ongoing for about a year now, intermittent, improved with Pepcid and Carafate but not resolved. Symptoms have led to a few ED visits now with workup as above. She has no gallstones on ultrasound. She has had leukocytosis noted on multiple CBCs. I repeated CBC today when she is feeling well and her white cell count is normal. No anemia. She clearly has some reflux that bothers her, unclear if she has a component of functional dyspepsia versus PUD or other. Given her ongoing symptoms despite antacid, recommending an upper endoscopy to further evaluate. I discussed the risks and benefits of EGD and anesthesia with her and she wanted to proceed. In the interim recommend a trial of Protonix 40 mg once to  twice daily and see if that helps. She can stop Pepcid while taking Protonix. Further recommendations pending EGD. If the exam is normal, may consider trial of buspirone for dyspepsia. She agreed.  Altered bowel habits / abnormal CT scan - Intermittent constipation and loose stools, can be associated with abdominal cramping. Differential includes irritable bowel syndrome however her CT scan shows some mild ? thickening through the colon which is nonspecific. I think a colonoscopy is reasonable to ensure no evidence of Crohn's or colitis given the CT results. We discussed colonoscopy, she wishes to think about it and is hesitant to schedule at this time after our discussion of this. Recommend daily fiber supplement to see if that'll help regulate her bowel a bit. If symptoms persist she should contact me and reconsider colonoscopy.  Ileene Patrick, MD [facility]  CC: McVey, Garner Gavel*

## 2018-03-02 ENCOUNTER — Encounter: Payer: Self-pay | Admitting: Gastroenterology

## 2018-03-02 ENCOUNTER — Ambulatory Visit (AMBULATORY_SURGERY_CENTER): Payer: 59 | Admitting: Gastroenterology

## 2018-03-02 VITALS — BP 120/81 | HR 97 | Temp 98.7°F | Resp 15 | Ht 64.0 in | Wt 142.0 lb

## 2018-03-02 DIAGNOSIS — R112 Nausea with vomiting, unspecified: Secondary | ICD-10-CM | POA: Diagnosis not present

## 2018-03-02 DIAGNOSIS — R1013 Epigastric pain: Secondary | ICD-10-CM | POA: Diagnosis not present

## 2018-03-02 DIAGNOSIS — K3189 Other diseases of stomach and duodenum: Secondary | ICD-10-CM | POA: Diagnosis not present

## 2018-03-02 DIAGNOSIS — K219 Gastro-esophageal reflux disease without esophagitis: Secondary | ICD-10-CM | POA: Diagnosis not present

## 2018-03-02 MED ORDER — SODIUM CHLORIDE 0.9 % IV SOLN: 500.0000 mL | Freq: Once | INTRAVENOUS | Status: DC

## 2018-03-02 NOTE — Patient Instructions (Signed)
Continue present medications. Await medications.     YOU HAD AN ENDOSCOPIC PROCEDURE TODAY AT THE [facility]:   Refer to the procedure report that was given to you for any specific questions about what was found during the examination.  If the procedure report does not answer your questions, please call your gastroenterologist to clarify.  If you requested that your care partner not be given the details of your procedure findings, then the procedure report has been included in a sealed envelope for you to review at your convenience later.  YOU SHOULD EXPECT: Some feelings of bloating in the abdomen. Passage of more gas than usual.  Walking can help get rid of the air that was put into your GI tract during the procedure and reduce the bloating. If you had a lower endoscopy (such as a colonoscopy or flexible sigmoidoscopy) you may notice spotting of blood in your stool or on the toilet paper. If you underwent a bowel prep for your procedure, you may not have a normal bowel movement for a few days.  Please Note:  You might notice some irritation and congestion in your nose or some drainage.  This is from the oxygen used during your procedure.  There is no need for concern and it should clear up in a day or so.  SYMPTOMS TO REPORT IMMEDIATELY:   Following upper endoscopy (EGD)  Vomiting of blood or coffee ground material  New chest pain or pain under the shoulder blades  Painful or persistently difficult swallowing  New shortness of breath  Fever of 100F or higher  Black, tarry-looking stools  For urgent or emergent issues, a gastroenterologist can be reached at any hour by calling [phone].   DIET:  We do recommend a small meal at first, but then you may proceed to your regular diet.  Drink plenty of fluids but you should avoid alcoholic beverages for 24 hours.  ACTIVITY:  You should plan to take it easy for the rest of today and you should NOT DRIVE or use heavy  machinery until tomorrow (because of the sedation medicines used during the test).    FOLLOW UP: Our staff will call the number listed on your records the next business day following your procedure to check on you and address any questions or concerns that you may have regarding the information given to you following your procedure. If we do not reach you, we will leave a message.  However, if you are feeling well and you are not experiencing any problems, there is no need to return our call.  We will assume that you have returned to your regular daily activities without incident.  If any biopsies were taken you will be contacted by phone or by letter within the next 1-3 weeks.  Please call us at [phone] if you have not heard about the biopsies in 3 weeks.    SIGNATURES/CONFIDENTIALITY: You and/or your care partner have signed paperwork which will be entered into your electronic medical record.  These signatures attest to the fact that that the information above on your After Visit Summary has been reviewed and is understood.  Full responsibility of the confidentiality of this discharge information lies with you and/or your care-partner.

## 2018-03-02 NOTE — Progress Notes (Signed)
Report to PACU, RN, vss, BBS= Clear.  

## 2018-03-02 NOTE — Op Note (Signed)
[facility] Patient Name: Diana Thomas Procedure Date: 03/02/2018 1:25 PM MRN: [id number] Endoscopist: Viviann Spare P. Adela Lank , MD Age: 23 Referring MD:  Date of Birth: 29-Dec-1995 Gender: Female Account #: [id number] Procedure:                Upper GI endoscopy Indications:              Epigastric abdominal pain / dyspepsia , Follow-up                            of gastro-esophageal reflux disease, Nausea with                            vomiting - occured on omeprazole and pepcid with                            breakthrough, now on protonix Medicines:                Monitored Anesthesia Care Procedure:                Pre-Anesthesia Assessment:                           - Prior to the procedure, a History and Physical                            was performed, and patient medications and                            allergies were reviewed. The patient's tolerance of                            previous anesthesia was also reviewed. The risks                            and benefits of the procedure and the sedation                            options and risks were discussed with the patient.                            All questions were answered, and informed consent                            was obtained. Prior Anticoagulants: The patient has                            taken no previous anticoagulant or antiplatelet                            agents. ASA Grade Assessment: II - A patient with                            mild systemic disease. After reviewing the risks  and benefits, the patient was deemed in                            satisfactory condition to undergo the procedure.                           After obtaining informed consent, the endoscope was                            passed under direct vision. Throughout the                            procedure, the patient's blood pressure, pulse, and                            oxygen saturations were  monitored continuously. The                            Model GIF-[id number]) scope was introduced                            through the mouth, and advanced to the second part                            of duodenum. The upper GI endoscopy was                            accomplished without difficulty. The patient                            tolerated the procedure well. Scope In: Scope Out: Findings:                 Esophagogastric landmarks were identified: the                            Z-line was found at 37 cm, the gastroesophageal                            junction was found at 37 cm and the upper extent of                            the gastric folds was found at 37 cm from the                            incisors.                           The exam of the esophagus was otherwise normal.                           The entire examined stomach was normal. Biopsies                            were taken  with a cold forceps for Helicobacter                            pylori testing.                           The duodenal bulb and second portion of the                            duodenum were normal. Complications:            No immediate complications. Estimated blood loss:                            Minimal. Estimated Blood Loss:     Estimated blood loss was minimal. Impression:               - Esophagogastric landmarks identified.                           - Normal esophagus.                           - Normal stomach. Biopsied to rule out H pylori.                           - Normal duodenal bulb and second portion of the                            duodenum. Recommendation:           - Patient has a contact number available for                            emergencies. The signs and symptoms of potential                            delayed complications were discussed with the                            patient. Return to normal activities tomorrow.                             Written discharge instructions were provided to the                            patient.                           - Resume previous diet.                           - Continue present medications.                           - Await pathology results with further  recommendations Willaim Rayas. Armbruster, MD 03/02/2018 1:46:15 PM This report has been signed electronically.

## 2018-03-02 NOTE — Progress Notes (Signed)
Called to room to assist during endoscopic procedure.  Patient ID and intended procedure confirmed with present staff. Received instructions for my participation in the procedure from the performing physician.  

## 2018-03-03 ENCOUNTER — Telehealth: Payer: Self-pay | Admitting: *Deleted

## 2018-03-03 NOTE — Telephone Encounter (Signed)
Left message on f/u call 

## 2018-03-03 NOTE — Telephone Encounter (Signed)
  Follow up Call-  Call back number 03/02/2018  Post procedure Call Back phone  # [phone]  Permission to leave phone message Yes    [facility]

## 2018-03-09 ENCOUNTER — Ambulatory Visit (INDEPENDENT_AMBULATORY_CARE_PROVIDER_SITE_OTHER): Payer: 59 | Admitting: Family Medicine

## 2018-03-09 ENCOUNTER — Other Ambulatory Visit: Payer: Self-pay

## 2018-03-09 DIAGNOSIS — Z3042 Encounter for surveillance of injectable contraceptive: Secondary | ICD-10-CM

## 2018-03-09 MED ORDER — BUSPIRONE HCL 15 MG PO TABS: 15.0000 mg | ORAL_TABLET | Freq: Two times a day (BID) | ORAL | 1 refills | Status: DC

## 2018-03-31 ENCOUNTER — Other Ambulatory Visit: Payer: Self-pay | Admitting: Physician Assistant

## 2018-03-31 DIAGNOSIS — R112 Nausea with vomiting, unspecified: Secondary | ICD-10-CM

## 2018-03-31 MED FILL — busPIRone HCL 15 MG TABS: 15 | 30 days supply | Qty: 60 | Fill #0

## 2018-03-31 MED FILL — PANTOPRAZOLE SOD DR 40 MG T: 40 | 45 days supply | Qty: 90 | Fill #0

## 2018-04-04 MED ORDER — ONDANSETRON 8 MG PO TBDP: 8.0000 mg | ORAL_TABLET | Freq: Three times a day (TID) | ORAL | 0 refills | Status: DC | PRN

## 2018-04-14 MED FILL — ONDANSETRON ODT 8 MG TABLET: 8 | 6 days supply | Qty: 20 | Fill #0

## 2018-04-28 ENCOUNTER — Ambulatory Visit: Payer: 59 | Admitting: Family Medicine

## 2018-04-29 ENCOUNTER — Ambulatory Visit (INDEPENDENT_AMBULATORY_CARE_PROVIDER_SITE_OTHER): Payer: 59 | Admitting: Family Medicine

## 2018-04-29 ENCOUNTER — Other Ambulatory Visit: Payer: Self-pay

## 2018-04-29 ENCOUNTER — Encounter: Payer: Self-pay | Admitting: Family Medicine

## 2018-04-29 VITALS — BP 122/64 | HR 76 | Temp 98.8°F | Ht 64.0 in | Wt 142.0 lb

## 2018-04-29 DIAGNOSIS — K219 Gastro-esophageal reflux disease without esophagitis: Secondary | ICD-10-CM | POA: Diagnosis not present

## 2018-04-29 DIAGNOSIS — F331 Major depressive disorder, recurrent, moderate: Secondary | ICD-10-CM | POA: Diagnosis not present

## 2018-04-29 DIAGNOSIS — F411 Generalized anxiety disorder: Secondary | ICD-10-CM | POA: Diagnosis not present

## 2018-04-29 MED ORDER — ESCITALOPRAM OXALATE 10 MG PO TABS: 10.0000 mg | ORAL_TABLET | Freq: Every day | ORAL | 2 refills | Status: DC

## 2018-04-29 NOTE — Patient Instructions (Addendum)
  anxiety, agitation,disorientation, diaphoresis, tachycardia, hyperthermia, hypertension, vomiting, and diarrhea, tremor, muscle rigidity,     If you have lab work done today you will be contacted with your lab results within the next 2 weeks.  If you have not heard from [place] then please contact us. The fastest way to get your results is to register for My Chart.   IF you received an x-ray today, you will receive an invoice from [facility]. Please contact [facility] at [phone] with questions or concerns regarding your invoice.   IF you received labwork today, you will receive an invoice from [place]. Please contact [facility] at [phone] with questions or concerns regarding your invoice.   Our billing staff will not be able to assist you with questions regarding bills from these companies.  You will be contacted with the lab results as soon as they are available. The fastest way to get your results is to activate your My Chart account. Instructions are located on the last page of this paperwork. If you have not heard from [place] regarding the results in 2 weeks, please contact this office.

## 2018-04-29 NOTE — Progress Notes (Signed)
3/14/20208:21 AM  Diana Thomas 28-Aug-1995, 23 y.o. female [id number]  Chief Complaint  Patient presents with  . Follow-up    return visit after GI appt. Now taking buspar, not helping for depression aand anxiety.    HPI:   Patient is a 23 y.o. female with past medical history significant for recurrent abd pain, nausea with vomiting, GERD and anxiety who presents today for routine followup  Last OV Jan 2020 Has seen GI, Dr Ellin Saba Normal EGD, changed her to buspar, protonix and carafate She declined colonoscopy protonix is better than pepcid, has not had any more episodes  Of recurring vomiting , abd issues are manageable Reports normal bowel movements with help of metamucil or miralax However anxiety and depression worse since she stopped the lexapro buspar was started to help with stomach issues Gad 7 and phq9 scores noted endorses passive SI, hopelessness Denies any plans or intent  Fall Risk  04/29/2018 04/29/2018 02/21/2018 02/09/2018 09/16/2017  Falls in the past year? 0 0 0 0 No  Number falls in past yr: - 0 - - -  Injury with Fall? 0 0 - - -     Depression screen [facility] 2/9 04/29/2018 04/29/2018 02/21/2018  Decreased Interest 1 0 1  Down, Depressed, Hopeless 1 0 1  PHQ - 2 Score 2 0 2  Altered sleeping 3 - 1  Tired, decreased energy 2 - 2  Change in appetite 1 - 2  Feeling bad or failure about yourself  2 - 2  Trouble concentrating 0 - 0  Moving slowly or fidgety/restless 0 - 0  Suicidal thoughts 2 - 0  PHQ-9 Score 12 - 9  Difficult doing work/chores Somewhat difficult - -   GAD 7 : Generalized Anxiety Score 04/29/2018  Nervous, Anxious, on Edge 3  Control/stop worrying 3  Worry too much - different things 3  Trouble relaxing 3  Restless 1  Easily annoyed or irritable 3  Afraid - awful might happen 0  Total GAD 7 Score 16  Anxiety Difficulty Very difficult     No Known Allergies  Prior to Admission medications   Medication Sig Start Date End Date  Taking? Authorizing Provider  busPIRone (BUSPAR) 15 MG tablet Take 1 tablet (15 mg total) by mouth 2 (two) times daily. Take 1/2 tablet two times a day for 2 weeks then increase to 1 tablet twice a day 03/09/18   Armbruster, Willaim Rayas, MD  escitalopram (LEXAPRO) 20 MG tablet Take 1 tablet (20 mg total) by mouth daily. 02/21/18   Myles Lipps, MD  FIBER PO Take by mouth daily.    [provider]  medroxyPROGESTERone (DEPO-PROVERA) 150 MG/ML injection Inject 150 mg into the muscle every 3 (three) months.    [provider]  ondansetron (ZOFRAN ODT) 8 MG disintegrating tablet Take 1 tablet (8 mg total) by mouth every 8 (eight) hours as needed for nausea or vomiting. 04/04/18   Myles Lipps, MD  pantoprazole (PROTONIX) 40 MG tablet Take 1-2 tablets (40-80 mg total) by mouth daily. 02/27/18   Armbruster, Willaim Rayas, MD  sucralfate (CARAFATE) 1 g tablet Take 1 tablet (1 g total) by mouth 4 (four) times daily -  with meals and at bedtime. 02/21/18   Myles Lipps, MD    Past Medical History:  Diagnosis Date  . Allergy   . Anemia   . Anxiety   . Depression   . Gastritis   . GERD (gastroesophageal reflux disease)   .  Heart murmur     Past Surgical History:  Procedure Laterality Date  . NO PAST SURGERIES      Social History   Tobacco Use  . Smoking status: Never Smoker  . Smokeless tobacco: Never Used  Substance Use Topics  . Alcohol use: Yes    Frequency: Never    Comment: occasional drinker    Family History  Problem Relation Age of Onset  . Heart disease Mother   . Hypertension Mother   . Hyperlipidemia Mother   . ADD / ADHD Father   . Healthy Father   . Healthy Sister   . Liver disease Maternal Grandmother   . Diabetes Maternal Uncle   . Colon cancer Neg Hx   . Esophageal cancer Neg Hx   . Stomach cancer Neg Hx   . Rectal cancer Neg Hx     ROS Per hpi  OBJECTIVE:  Blood pressure 122/64, pulse 76, temperature 98.8 F (37.1 C), height 5\' 4"  (1.626  m), weight 142 lb (64.4 kg), SpO2 96 %. Body mass index is 24.37 kg/m.   Physical Exam Vitals signs and nursing note reviewed.  Constitutional:      Appearance: She is well-developed.  HENT:     Head: Normocephalic and atraumatic.  Eyes:     General: No scleral icterus.    Conjunctiva/sclera: Conjunctivae normal.     Pupils: Pupils are equal, round, and reactive to light.  Neck:     Musculoskeletal: Neck supple.  Pulmonary:     Effort: Pulmonary effort is normal.  Skin:    General: Skin is warm and dry.  Neurological:     Mental Status: She is alert and oriented to person, place, and time.      ASSESSMENT and PLAN  1. Generalized anxiety disorder 2. Moderate episode of recurrent major depressive disorder (HCC) Uncontrolled. Discussed options. Given GI stability, will leave buspar at current dose, restarting low dose lexapro, reviewed r/se/b incl concerns for serotonin syndrome  3. Gastroesophageal reflux disease without esophagitis Much improved. Cont current regime  Other orders - escitalopram (LEXAPRO) 10 MG tablet; Take 1 tablet (10 mg total) by mouth daily.  Return in about 4 weeks (around 05/27/2018).    Myles Lipps, MD Primary Care at [facility] [address] [place], [place] [address] Ph.  [phone] Fax [phone]

## 2018-05-01 MED FILL — ESCITALOPRAM 10 MG TABLET: 10 | 30 days supply | Qty: 30 | Fill #0

## 2018-05-01 MED FILL — ESCITALOPRAM 20 MG TABLET: 20 | 30 days supply | Qty: 30 | Fill #1

## 2018-05-01 MED FILL — busPIRone HCL 15 MG TABS: 15 | 30 days supply | Qty: 60 | Fill #1

## 2018-05-23 MED FILL — PANTOPRAZOLE SOD DR 40 MG T: 40 | 45 days supply | Qty: 90 | Fill #1

## 2018-05-30 ENCOUNTER — Encounter: Payer: Self-pay | Admitting: Family Medicine

## 2018-05-30 DIAGNOSIS — R112 Nausea with vomiting, unspecified: Secondary | ICD-10-CM

## 2018-05-30 MED ORDER — ONDANSETRON 8 MG PO TBDP: 8.0000 mg | ORAL_TABLET | Freq: Three times a day (TID) | ORAL | 3 refills | Status: DC | PRN

## 2018-05-30 MED ORDER — PROMETHAZINE HCL 12.5 MG PO TABS: 12.5000 mg | ORAL_TABLET | Freq: Three times a day (TID) | ORAL | 1 refills | Status: DC | PRN

## 2018-05-30 MED FILL — PROMETHAZINE 12.5 MG TABLET: 12.5 | 7 days supply | Qty: 20 | Fill #0

## 2018-05-30 MED FILL — ONDANSETRON 8 MG TBDP: 8 | 7 days supply | Qty: 20 | Fill #0

## 2018-06-28 ENCOUNTER — Other Ambulatory Visit: Payer: Self-pay | Admitting: Gastroenterology

## 2018-06-28 MED FILL — busPIRone HCL 15 MG TABS: 15 | 30 days supply | Qty: 60 | Fill #0

## 2018-08-10 MED FILL — busPIRone HCL 15 MG TABS: 15 | 30 days supply | Qty: 60 | Fill #1

## 2018-08-10 MED FILL — ONDANSETRON ODT 8 MG TABLET: 8 | 7 days supply | Qty: 20 | Fill #1

## 2018-08-10 MED FILL — PANTOPRAZOLE SOD DR 40 MG T: 40 | 45 days supply | Qty: 90 | Fill #2

## 2018-08-10 MED FILL — PROMETHAZINE 12.5 MG TABLET: 12.5 | 7 days supply | Qty: 20 | Fill #1

## 2018-08-10 MED FILL — ESCITALOPRAM 10 MG TABLET: 10 | 30 days supply | Qty: 30 | Fill #1

## 2018-08-29 ENCOUNTER — Ambulatory Visit (INDEPENDENT_AMBULATORY_CARE_PROVIDER_SITE_OTHER): Payer: 59 | Admitting: Family Medicine

## 2018-08-29 ENCOUNTER — Encounter: Payer: Self-pay | Admitting: Family Medicine

## 2018-08-29 ENCOUNTER — Other Ambulatory Visit: Payer: Self-pay

## 2018-08-29 VITALS — BP 112/62 | HR 71 | Temp 98.5°F | Ht 65.0 in | Wt 143.0 lb

## 2018-08-29 DIAGNOSIS — Z0000 Encounter for general adult medical examination without abnormal findings: Secondary | ICD-10-CM | POA: Diagnosis not present

## 2018-08-29 NOTE — Patient Instructions (Signed)
° ° ° °  If you have lab work done today you will be contacted with your lab results within the next 2 weeks.  If you have not heard from us then please contact us. The fastest way to get your results is to register for My Chart. ° ° °IF you received an x-ray today, you will receive an invoice from [facility]. Please contact [facility] at [phone] with questions or concerns regarding your invoice.  ° °IF you received labwork today, you will receive an invoice from [facility]. Please contact [facility] at [phone] with questions or concerns regarding your invoice.  ° °Our billing staff will not be able to assist you with questions regarding bills from these companies. ° °You will be contacted with the lab results as soon as they are available. The fastest way to get your results is to activate your My Chart account. Instructions are located on the last page of this paperwork. If you have not heard from us regarding the results in 2 weeks, please contact this office. °  ° ° ° °

## 2018-08-29 NOTE — Progress Notes (Signed)
7/14/20208:37 AM  Diana BandyKierra A Thomas [birth year], 23 y.o., female [id number]  Chief Complaint  Patient presents with   Annual Exam    CPE    HPI:  CPE-no concerns GYN-pap smear normal  Fall Risk  08/29/2018 04/29/2018 04/29/2018 02/21/2018 02/09/2018  Falls in the past year? 0 0 0 0 0  Number falls in past yr: 0 - 0 - -  Injury with Fall? 0 0 0 - -  Follow up Falls evaluation completed - - - -   [facility] 08/29/2018 04/29/2018 04/29/2018  Decreased Interest 0 1 1  Down, Depressed, Hopeless 0 1 1  PHQ - 2 Score 0 2 2  Altered sleeping - 3 3  Tired, decreased energy - 2 2  Change in appetite - 1 1  Feeling bad or failure about yourself  - 2 2  Trouble concentrating - 0 0  Moving slowly or fidgety/restless - 0 0  Suicidal thoughts - 2 2  PHQ-9 Score - 12 12  Difficult doing work/chores - Somewhat difficult Somewhat difficult    No Known Allergies  Prior to Admission medications   Medication Sig Start Date End Date Taking? Authorizing Provider  busPIRone (BUSPAR) 15 MG tablet TAKE 1/2 TABLET BY MOUTH TWO TIMES DAILY FOR 2 WEEKS THEN INCREASE TO 1 TABLET TWO TIMES DAILY 06/28/18  Yes Armbruster, Willaim RayasSteven P, MD  escitalopram (LEXAPRO) 10 MG tablet Take 1 tablet (10 mg total) by mouth daily. 04/29/18  Yes Myles LippsSantiago, Irma M, MD  FIBER PO Take by mouth daily.   Yes [provider]  medroxyPROGESTERone (DEPO-PROVERA) 150 MG/ML injection Inject 150 mg into the muscle every 3 (three) months.   Yes [provider]  ondansetron (ZOFRAN ODT) 8 MG disintegrating tablet Take 1 tablet (8 mg total) by mouth every 8 (eight) hours as needed for nausea or vomiting. 05/30/18  Yes Myles LippsSantiago, Irma M, MD  pantoprazole (PROTONIX) 40 MG tablet Take 1-2 tablets (40-80 mg total) by mouth daily. 02/27/18  Yes Armbruster, Willaim RayasSteven P, MD  promethazine (PHENERGAN) 12.5 MG tablet Take 1 tablet (12.5 mg total) by mouth every 8 (eight) hours as needed for nausea or vomiting. 05/30/18  Yes  Myles LippsSantiago, Irma M, MD    Past Medical History:  Diagnosis Date   Allergy    Anemia    Anxiety    Depression    Gastritis    GERD (gastroesophageal reflux disease)    Heart murmur     Past Surgical History:  Procedure Laterality Date   NO PAST SURGERIES      Social History   Tobacco Use   Smoking status: Never Smoker   Smokeless tobacco: Never Used  Substance Use Topics   Alcohol use: Yes    Frequency: Never    Comment: occasional drinker    Social History   Social History Narrative   Not on file    Family History  Problem Relation Age of Onset   Heart disease Mother    Hypertension Mother    Hyperlipidemia Mother    ADD / ADHD Father    Healthy Father    Healthy Sister    Liver disease Maternal Grandmother    Diabetes Maternal Uncle    Colon cancer Neg Hx    Esophageal cancer Neg Hx    Stomach cancer Neg Hx    Rectal cancer Neg Hx     ROS: Constitutional: no loss of appetite,  unexplained weight loss/gain HEENT: no difficulty with hearing, sinus problems,  runny nose, post nasal drip, ringing in the ears, mouth sores, loose teeth, ear pain, nosebleeds, sore throat, facial pain or numbness, no visual changes, vision noted right 20/50, left 20/40 with bilat 20/25-uses contacts, no itching or drainage of the eyes CV: no irregular heartbeat, chest pain, swelling of the feet or legs, leg pain with walking  RESP: no short of breath, night sweats, prolonged cough, wheezing, sputum production  UQ:[id number] GI-EGD completed-normal 1/20,  Constipation-miralax, metamucil, buspar helps with nausea/vominting GU: no painful urination, frequent of urination LX:BWIOM pain-left knee pain with extended standing-PT in the past, no injections Skin:no persistent rash, new skin lesions, no hair loss Neuro: no headaches, double or blurred vision PSY: no insomnia, depression-Lexapro-no therapist-pt uses writing and list for goals Endo: no  intolerance to heat or cold, no increase in thirst Heme: no easy brusing, anemia, swollen lymph nodes Allergy: no seasonal allergies, hay fever   Today's Vitals   08/29/18 0818  BP: 131/73  Pulse: 71  Temp: 98.5 F (36.9 C)  TempSrc: Oral  SpO2: 97%  Weight: 143 lb (64.9 kg)  Height: 5\' 5"  (1.651 m)   Body mass index is 23.8 kg/m.   EXAM:  Constitutional:  HEENT: normocephalic, atraumatic, scalp with no lesions, no hair loss Sclera white, conjunctiva non injected, disc-sharp margins, PERRL External ear bilat  with no lesions or masses, auditory canal bilat normal Eardrum bilat with positive light reflex Nose clear drainage with no lesions and no swelling of the turbinates Throat and mouth with good dentition, no lesions, no bleeding Pharynx and tonsils with no exudate or erythema Neck: normal flexion/extension/rotation Thyroid: no enlargement Lungs: clear breath sounds to auscultation Heart: no lifts or heaves , normal S1/2 with no murmur on auscultation Abdomen: no scars or striae, normal bowel sounds, no tenderness to palpation, liver and spleen non palpable Back: no CVA tenderness, no paraspinal tenderness Ext: upper-no cyanosis, normal flexion/extension-wrists, elbows, shoulders Radial pulses @+ , normal and symmetrical; lower-normal flexion-knees, hips, ankles, pulses posterior tibial and dorsalis pedis 2+ bilat equal Lymph nodes-anterior and posterior cervical not palpable, axillary not palpable bilateral Neuro: awake and alert, oriented to person, place and time, normal gait and balance    ASSESSMENT/PLAN: 1. 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

## 2018-09-18 MED FILL — ESCITALOPRAM 20 MG TABLET: 20 | 30 days supply | Qty: 30 | Fill #2

## 2018-10-06 ENCOUNTER — Ambulatory Visit: Payer: 59

## 2018-10-06 ENCOUNTER — Encounter: Payer: Self-pay | Admitting: Family Medicine

## 2018-10-12 ENCOUNTER — Other Ambulatory Visit: Payer: Self-pay

## 2018-10-12 ENCOUNTER — Encounter: Payer: Self-pay | Admitting: Family Medicine

## 2018-10-12 ENCOUNTER — Ambulatory Visit: Payer: 59

## 2018-10-12 ENCOUNTER — Other Ambulatory Visit (HOSPITAL_COMMUNITY)
Admission: RE | Admit: 2018-10-12 | Discharge: 2018-10-12 | Disposition: A | Discharge status: Home / Self Care | Payer: 59 | Source: Ambulatory Visit | Attending: Family Medicine | Admitting: Family Medicine

## 2018-10-12 ENCOUNTER — Ambulatory Visit (INDEPENDENT_AMBULATORY_CARE_PROVIDER_SITE_OTHER): Payer: 59 | Admitting: Family Medicine

## 2018-10-12 VITALS — BP 112/75 | HR 71 | Temp 98.3°F | Ht 65.0 in | Wt 137.0 lb

## 2018-10-12 DIAGNOSIS — Z3042 Encounter for surveillance of injectable contraceptive: Secondary | ICD-10-CM

## 2018-10-12 DIAGNOSIS — Z113 Encounter for screening for infections with a predominantly sexual mode of transmission: Secondary | ICD-10-CM | POA: Insufficient documentation

## 2018-10-12 DIAGNOSIS — Z23 Encounter for immunization: Secondary | ICD-10-CM | POA: Diagnosis not present

## 2018-10-12 DIAGNOSIS — F411 Generalized anxiety disorder: Secondary | ICD-10-CM | POA: Diagnosis not present

## 2018-10-12 DIAGNOSIS — K219 Gastro-esophageal reflux disease without esophagitis: Secondary | ICD-10-CM | POA: Diagnosis not present

## 2018-10-12 LAB — POCT URINE PREGNANCY: Preg Test, Ur: NEGATIVE

## 2018-10-12 MED ORDER — PANTOPRAZOLE SODIUM 40 MG PO TBEC: 40.0000 mg | DELAYED_RELEASE_TABLET | Freq: Every day | ORAL | 3 refills | Status: DC

## 2018-10-12 MED ORDER — BUSPIRONE HCL 15 MG PO TABS: 15.0000 mg | ORAL_TABLET | Freq: Every day | ORAL | 3 refills | Status: DC

## 2018-10-12 MED ORDER — MEDROXYPROGESTERONE ACETATE 150 MG/ML IM SUSP
150.0000 mg | Freq: Once | INTRAMUSCULAR | Status: AC
  Administered 2018-10-12: 15:00:00 150 mg via INTRAMUSCULAR

## 2018-10-12 MED ORDER — ESCITALOPRAM OXALATE 20 MG PO TABS: 20.0000 mg | ORAL_TABLET | Freq: Every day | ORAL | 1 refills | Status: DC

## 2018-10-12 MED FILL — ESCITALOPRAM 20 MG TABLET: 20 | 90 days supply | Qty: 90 | Fill #0

## 2018-10-12 MED FILL — PANTOPRAZOLE SOD DR 40 MG T: 40 | 45 days supply | Qty: 90 | Fill #0

## 2018-10-12 MED FILL — busPIRone HCL 15 MG TABS: 15 | 90 days supply | Qty: 90 | Fill #0

## 2018-10-12 NOTE — Progress Notes (Signed)
1 

## 2018-10-12 NOTE — Patient Instructions (Addendum)
Next depo due Nov 12 - Dec 10, nurse visit    If you have lab work done today you will be contacted with your lab results within the next 2 weeks.  If you have not heard from [place] then please contact us. The fastest way to get your results is to register for My Chart.   IF you received an x-ray today, you will receive an invoice from [facility]. Please contact [facility] at [phone] with questions or concerns regarding your invoice.   IF you received labwork today, you will receive an invoice from [place]. Please contact [facility] at [phone] with questions or concerns regarding your invoice.   Our billing staff will not be able to assist you with questions regarding bills from these companies.  You will be contacted with the lab results as soon as they are available. The fastest way to get your results is to activate your My Chart account. Instructions are located on the last page of this paperwork. If you have not heard from [place] regarding the results in 2 weeks, please contact this office.

## 2018-10-12 NOTE — Progress Notes (Signed)
8/27/20209:52 AM  Diana Thomas 06-20-1995, 23 y.o., female [id number]  Chief Complaint  Patient presents with   SEXUALLY TRANSMITTED DISEASE    screening   Medication Problem    protonix, wants to know how long she is to take   Contraception    depo    HPI:   Patient is a 23 y.o. female with past medical history significant for GERD who presents today for STI screening, depo and GERD followup  Last depo injection in Jan 2020  Remains amenorrheic Happy with method, interruption due to covid Has been sexually active wo contraception She denies any STD symptoms, routine screening GERD significantly with improved control on protonix. Takes almost daily, yesterday she did not take and vomited EGD Jan 2020 - normal She had significant breakthrough gerd with vomiting on omeprazole and H2B She does not eat diary Taking 15mg  buspar and lexapro 20mg  at night - anxiety well controlled Denies any side effects   Depression screen [facility] 2/9 10/12/2018 08/29/2018 04/29/2018  Decreased Interest 0 0 1  Down, Depressed, Hopeless 0 0 1  PHQ - 2 Score 0 0 2  Altered sleeping - - 3  Tired, decreased energy - - 2  Change in appetite - - 1  Feeling bad or failure about yourself  - - 2  Trouble concentrating - - 0  Moving slowly or fidgety/restless - - 0  Suicidal thoughts - - 2  PHQ-9 Score - - 12  Difficult doing work/chores - - Somewhat difficult    Fall Risk  10/12/2018 08/29/2018 04/29/2018 04/29/2018 02/21/2018  Falls in the past year? 0 0 0 0 0  Number falls in past yr: 0 0 - 0 -  Injury with Fall? - 0 0 0 -  Follow up - Falls evaluation completed - - -     No Known Allergies  Prior to Admission medications   Medication Sig Start Date End Date Taking? Authorizing Provider  busPIRone (BUSPAR) 15 MG tablet TAKE 1/2 TABLET BY MOUTH TWO TIMES DAILY FOR 2 WEEKS THEN INCREASE TO 1 TABLET TWO TIMES DAILY 06/28/18  Yes Armbruster, Carlota Raspberry, MD  escitalopram (LEXAPRO) 10 MG tablet Take  1 tablet (10 mg total) by mouth daily. 04/29/18  Yes Rutherford Guys, MD  FIBER PO Take by mouth daily.   Yes [provider]  medroxyPROGESTERone (DEPO-PROVERA) 150 MG/ML injection Inject 150 mg into the muscle every 3 (three) months.   Yes [provider]  ondansetron (ZOFRAN ODT) 8 MG disintegrating tablet Take 1 tablet (8 mg total) by mouth every 8 (eight) hours as needed for nausea or vomiting. 05/30/18  Yes Rutherford Guys, MD  pantoprazole (PROTONIX) 40 MG tablet Take 1-2 tablets (40-80 mg total) by mouth daily. 02/27/18  Yes Armbruster, Carlota Raspberry, MD  promethazine (PHENERGAN) 12.5 MG tablet Take 1 tablet (12.5 mg total) by mouth every 8 (eight) hours as needed for nausea or vomiting. 05/30/18  Yes Rutherford Guys, MD    Past Medical History:  Diagnosis Date   Allergy    Anemia    Anxiety    Depression    Gastritis    GERD (gastroesophageal reflux disease)    Heart murmur     Past Surgical History:  Procedure Laterality Date   NO PAST SURGERIES      Social History   Tobacco Use   Smoking status: Never Smoker   Smokeless tobacco: Never Used  Substance Use Topics   Alcohol use: Yes  Frequency: Never    Comment: occasional drinker    Family History  Problem Relation Age of Onset   Heart disease Mother    Hypertension Mother    Hyperlipidemia Mother    ADD / ADHD Father    Healthy Father    Healthy Sister    Liver disease Maternal Grandmother    Diabetes Maternal Uncle    Colon cancer Neg Hx    Esophageal cancer Neg Hx    Stomach cancer Neg Hx    Rectal cancer Neg Hx     ROS Per hpi  OBJECTIVE:  Today's Vitals   10/12/18 0934  BP: 112/75  Pulse: 71  Temp: 98.3 F (36.8 C)  TempSrc: Oral  SpO2: 99%  Weight: 137 lb (62.1 kg)  Height: 5\' 5"  (1.651 m)   Body mass index is 22.8 kg/m.   Physical Exam Vitals signs and nursing note reviewed.  Constitutional:      Appearance: She is well-developed.  HENT:      Head: Normocephalic and atraumatic.  Eyes:     General: No scleral icterus.    Conjunctiva/sclera: Conjunctivae normal.     Pupils: Pupils are equal, round, and reactive to light.  Neck:     Musculoskeletal: Neck supple.  Pulmonary:     Effort: Pulmonary effort is normal.  Skin:    General: Skin is warm and dry.  Neurological:     Mental Status: She is alert and oriented to person, place, and time.     Results for orders placed or performed in visit on 10/12/18 (from the past 24 hour(s))  POCT urine pregnancy     Status: None   Collection Time: 10/12/18  9:45 AM  Result Value Ref Range   Preg Test, Ur Negative Negative    No results found.   ASSESSMENT and PLAN  1. Gastroesophageal reflux disease without esophagitis Controlled. Continue current regime. Start calcium/mg OTC supplement  2. Generalized anxiety disorder Controlled. Continue current regime.   3. Need for vaccination - Flu Vaccine QUAD 36+ mos IM - Td vaccine greater than or equal to 7yo preservative free IM   4. Encounter for surveillance of injectable contraceptive Restart depo. discussed back up method for next 7 days. Repeat home preg test in 4 weeks. Next depo due Nov 12 - Dec 10, nurse visit. - POCT urine pregnancy - medroxyPROGESTERone (DEPO-PROVERA) injection 150 mg  5. Screening examination for STD (sexually transmitted disease) - HIV Antibody (routine testing w rflx) - RPR - Urine cytology ancillary only  Other orders - busPIRone (BUSPAR) 15 MG tablet; Take 1 tablet (15 mg total) by mouth at bedtime. - escitalopram (LEXAPRO) 20 MG tablet; Take 1 tablet (20 mg total) by mouth at bedtime. - pantoprazole (PROTONIX) 40 MG tablet; Take 1-2 tablets (40-80 mg total) by mouth daily.  Return in about 6 months (around 04/14/2019) for anxiety, gerd.    Myles LippsIrma M Santiago, MD [facility] at [facility] [address] [place], [place] [id number] Ph.  [phone] Fax [phone]

## 2018-10-13 LAB — HIV ANTIBODY (ROUTINE TESTING W REFLEX): HIV Screen 4th Generation wRfx: NONREACTIVE

## 2018-10-13 LAB — RPR: RPR Ser Ql: NONREACTIVE

## 2018-10-16 LAB — URINE CYTOLOGY ANCILLARY ONLY: Candida vaginitis: NEGATIVE

## 2018-10-19 MED ORDER — METRONIDAZOLE 500 MG PO TABS: 500.0000 mg | ORAL_TABLET | Freq: Two times a day (BID) | ORAL | 0 refills | Status: DC

## 2018-10-19 MED FILL — METRONIDAZOLE 500 MG TABS: 500 | 7 days supply | Qty: 14 | Fill #0

## 2018-10-19 NOTE — Addendum Note (Signed)
Addended by: Rutherford Guys on: 10/19/2018 08:10 AM   Modules accepted: Orders

## 2018-10-28 LAB — URINE CYTOLOGY ANCILLARY ONLY
Chlamydia: NEGATIVE
Neisseria Gonorrhea: NEGATIVE
Trichomonas: NEGATIVE

## 2018-11-27 MED FILL — ONDANSETRON ODT 8 MG TABLET: 8 | 20 days supply | Qty: 20 | Fill #2

## 2018-12-18 ENCOUNTER — Encounter: Payer: Self-pay | Admitting: Family Medicine

## 2018-12-21 ENCOUNTER — Ambulatory Visit: Payer: 59 | Admitting: Family Medicine

## 2018-12-22 ENCOUNTER — Encounter: Payer: Self-pay | Admitting: Family Medicine

## 2018-12-22 ENCOUNTER — Other Ambulatory Visit: Payer: Self-pay

## 2018-12-22 ENCOUNTER — Ambulatory Visit (INDEPENDENT_AMBULATORY_CARE_PROVIDER_SITE_OTHER): Payer: 59 | Admitting: Family Medicine

## 2018-12-22 ENCOUNTER — Telehealth: Payer: Self-pay | Admitting: Family Medicine

## 2018-12-22 VITALS — BP 118/68 | HR 72 | Temp 98.9°F | Ht 65.0 in | Wt 154.8 lb

## 2018-12-22 DIAGNOSIS — K219 Gastro-esophageal reflux disease without esophagitis: Secondary | ICD-10-CM

## 2018-12-22 DIAGNOSIS — K5900 Constipation, unspecified: Secondary | ICD-10-CM

## 2018-12-22 DIAGNOSIS — F331 Major depressive disorder, recurrent, moderate: Secondary | ICD-10-CM | POA: Diagnosis not present

## 2018-12-22 DIAGNOSIS — F411 Generalized anxiety disorder: Secondary | ICD-10-CM | POA: Diagnosis not present

## 2018-12-22 NOTE — Telephone Encounter (Signed)
fmla paperwork came across fax 11.06.2020 . Put paperwork in provider box at nurses station to be completed by provider no fees collected /came via fax   FR

## 2018-12-22 NOTE — Patient Instructions (Addendum)
For constipation   Make sure you are drinking enough water daily. Make sure you are getting enough fiber in your diet - this will make you regular - you can eat high fiber foods or use metamucil as a supplement - it is really important to drink enough water when using fiber supplements.  Use 4 capfuls of Colace and 6 doses of Miralax and drink it in 2 hours - this should result in several watery stools - if it does not repeat the next day and then go to daily miralax for a week to make sure your bowels are clean and retrained to work properly  For the most aggressive treatment of constipation Use 14 capfuls of Miralax in 1 gallon of fluid (gatoraid or water work well or a combination of the two) and drink over 12h - it is ok to eat during this time and then use Miralax 1 capful daily for about 2 weeks to prevent the constipation from returning   Fiber Content in Foods  See the following list for the dietary fiber content of some common foods. High-fiber foods High-fiber foods contain 4 grams or more (4g or more) of fiber per serving. They include:  Artichoke (fresh) - 1 medium has 10.3g of fiber.  Baked beans, plain or vegetarian (canned) -  cup has 5.2g of fiber.  Blackberries or raspberries (fresh) -  cup has 4g of fiber.  Bran cereal -  cup has 8.6g of fiber.  Bulgur (cooked) -  cup has 4g of fiber.  Kidney beans (canned) -  cup has 6.8g of fiber.  Lentils (cooked) -  cup has 7.8g of fiber.  Pear (fresh) - 1 medium has 5.1g of fiber.  Peas (frozen) -  cup has 4.4g of fiber.  Pinto beans (canned) -  cup has 5.5g of fiber.  Pinto beans (dried and cooked) -  cup has 7.7g of fiber.  Potato with skin (baked) - 1 medium has 4.4g of fiber.  Quinoa (cooked) -  cup has 5g of fiber.  Soybeans (canned, frozen, or fresh) -  cup has 5.1g of fiber. Moderate-fiber foods Moderate-fiber foods contain 1-4 grams (1-4g) of fiber per serving. They include:  Almonds - 1 oz. has  3.5g of fiber.  Apple with skin - 1 medium has 3.3g of fiber.  Applesauce, sweetened -  cup has 1.5g of fiber.  Bagel, plain - one 4-inch (10-cm) bagel has 2g of fiber.  Banana - 1 medium has 3.1g of fiber.  Broccoli (cooked) -  cup has 2.5g of fiber.  Carrots (cooked) -  cup has 2.3g of fiber.  Corn (canned or frozen) -  cup has 2.1g of fiber.  Corn tortilla - one 6-inch (15-cm) tortilla has 1.5g of fiber.  Green beans (canned) -  cup has 2g of fiber.  Instant oatmeal -  cup has about 2g of fiber.  Long-grain brown rice (cooked) - 1 cup has 3.5g of fiber.  Macaroni, enriched (cooked) - 1 cup has 2.5g of fiber.  Melon - 1 cup has 1.4g of fiber.  Multigrain cereal -  cup has about 2-4g of fiber.  Orange - 1 small has 3.1g of fiber.  Potatoes, mashed -  cup has 1.6g of fiber.  Raisins - 1/4 cup has 1.6g of fiber.  Squash -  cup has 2.9g of fiber.  Sunflower seeds -  cup has 1.1g of fiber.  Tomato - 1 medium has 1.5g of fiber.  Vegetable or soy patty - 1  has 3.4g of fiber.  Whole-wheat bread - 1 slice has 2g of fiber.  Whole-wheat spaghetti -  cup has 3.2g of fiber. Low-fiber foods Low-fiber foods contain less than 1 gram (less than 1g) of fiber per serving. They include:  Egg - 1 large.  Flour tortilla - one 6-inch (15-cm) tortilla.  Fruit juice -  cup.  Lettuce - 1 cup.  Meat, poultry, or fish - 1 oz.  Milk - 1 cup.  Spinach (raw) - 1 cup.  White bread - 1 slice.  White rice -  cup.  Yogurt -  cup. Actual amounts of fiber in foods may be different depending on processing. Talk with your dietitian about how much fiber you need in your diet. This information is not intended to replace advice given to you by your health care provider. Make sure you discuss any questions you have with your health care provider. Document Released: 06/20/2006 Document Revised: 09/25/2015 Document Reviewed: 03/27/2015 Elsevier Patient Education  [employer].   If you have lab work done today you will be contacted with your lab results within the next 2 weeks.  If you have not heard from [place] then please contact us. The fastest way to get your results is to register for My Chart.   IF you received an x-ray today, you will receive an invoice from [facility]. Please contact [facility] at [phone] with questions or concerns regarding your invoice.   IF you received labwork today, you will receive an invoice from [facility]. Please contact [facility] at [phone] with questions or concerns regarding your invoice.   Our billing staff will not be able to assist you with questions regarding bills from these companies.  You will be contacted with the lab results as soon as they are available. The fastest way to get your results is to activate your My Chart account. Instructions are located on the last page of this paperwork. If you have not heard from [place] regarding the results in 2 weeks, please contact this office.

## 2018-12-22 NOTE — Progress Notes (Signed)
11/6/202011:57 AM  Diana Thomas 28-Apr-1995, 22 y.o., female [id number]  Chief Complaint  Patient presents with  . [name]    renewal (stomach issue)    HPI:   Patient is a 23 y.o. female with past medical history significant for GERD and anxiety who presents today requesting renewal for [name]  Last OV aug 2020  GERD significantly with improved control on protonix, takes daily. Works on diet, avoidance of triggers Has intense reflux +/- vomiting about once a month, lasting 1-2 days EGD Jan 2020 - normal She had significant breakthrough gerd with vomiting on omeprazole and H2B  She is recently having issues with constipation despite daily colace and miralax use Last normal BM was a week ago No abd distention, pain, nausea or vomiting Has stopped eating as many salads of recent  Taking 15mg  buspar and lexapro 20mg  at night - anxiety well controlled Doing 2 jobs and going back to schools Denies any side effects  Depression screen [name] 2/9 12/22/2018 10/12/2018 08/29/2018  Decreased Interest 0 0 0  Down, Depressed, Hopeless 0 0 0  PHQ - 2 Score 0 0 0  Altered sleeping - - -  Tired, decreased energy - - -  Change in appetite - - -  Feeling bad or failure about yourself  - - -  Trouble concentrating - - -  Moving slowly or fidgety/restless - - -  Suicidal thoughts - - -  PHQ-9 Score - - -  Difficult doing work/chores - - -    Fall Risk  12/22/2018 10/12/2018 08/29/2018 04/29/2018 04/29/2018  Falls in the past year? 0 0 0 0 0  Number falls in past yr: 0 0 0 - 0  Injury with Fall? 0 - 0 0 0  Follow up Falls evaluation completed - Falls evaluation completed - -     No Known Allergies  Prior to Admission medications   Medication Sig Start Date End Date Taking? Authorizing Provider  busPIRone (BUSPAR) 15 MG tablet Take 1 tablet (15 mg total) by mouth at bedtime. 10/12/18  Yes 05/01/2018, MD  escitalopram (LEXAPRO) 20 MG tablet Take 1 tablet (20 mg total) by mouth at  bedtime. 10/12/18  Yes Myles Lipps, MD  FIBER PO Take by mouth daily.   Yes [provider]  medroxyPROGESTERone (DEPO-PROVERA) 150 MG/ML injection Inject 150 mg into the muscle every 3 (three) months.   Yes [provider]  ondansetron (ZOFRAN ODT) 8 MG disintegrating tablet Take 1 tablet (8 mg total) by mouth every 8 (eight) hours as needed for nausea or vomiting. 05/30/18  Yes Myles Lipps, MD  pantoprazole (PROTONIX) 40 MG tablet Take 1-2 tablets (40-80 mg total) by mouth daily. 10/12/18  Yes Myles Lipps, MD  promethazine (PHENERGAN) 12.5 MG tablet Take 1 tablet (12.5 mg total) by mouth every 8 (eight) hours as needed for nausea or vomiting. 05/30/18  Yes Myles Lipps, MD    Past Medical History:  Diagnosis Date  . Allergy   . Anemia   . Anxiety   . Depression   . Gastritis   . GERD (gastroesophageal reflux disease)   . Heart murmur     Past Surgical History:  Procedure Laterality Date  . NO PAST SURGERIES      Social History   Tobacco Use  . Smoking status: Never Smoker  . Smokeless tobacco: Never Used  Substance Use Topics  . Alcohol use: Yes    Frequency: Never  Comment: occasional drinker    Family History  Problem Relation Age of Onset  . Heart disease Mother   . Hypertension Mother   . Hyperlipidemia Mother   . ADD / ADHD Father   . Healthy Father   . Healthy Sister   . Liver disease Maternal Grandmother   . Diabetes Maternal Uncle   . Colon cancer Neg Hx   . Esophageal cancer Neg Hx   . Stomach cancer Neg Hx   . Rectal cancer Neg Hx     Review of Systems  Constitutional: Negative for chills and fever.  Respiratory: Negative for cough and shortness of breath.   Cardiovascular: Negative for chest pain, palpitations and leg swelling.  Gastrointestinal: Negative for abdominal pain, nausea and vomiting.   Per hpi  OBJECTIVE:  Today's Vitals   12/22/18 1129  BP: 118/68  Pulse: 72  Temp: 98.9 F (37.2 C)   TempSrc: Oral  SpO2: 97%  Weight: 154 lb 12.8 oz (70.2 kg)  Height: 5\' 5"  (1.651 m)   Body mass index is 25.76 kg/m.   Physical Exam Vitals signs and nursing note reviewed.  Constitutional:      Appearance: She is well-developed.  HENT:     Head: Normocephalic and atraumatic.  Eyes:     General: No scleral icterus.    Conjunctiva/sclera: Conjunctivae normal.     Pupils: Pupils are equal, round, and reactive to light.  Neck:     Musculoskeletal: Neck supple.  Pulmonary:     Effort: Pulmonary effort is normal.  Skin:    General: Skin is warm and dry.  Neurological:     Mental Status: She is alert and oriented to person, place, and time.     No results found for this or any previous visit (from the past 24 hour(s)).  No results found.   ASSESSMENT and PLAN  1. Gastroesophageal reflux disease without esophagitis Stable. Cont current mgt. [name] will be completed.   2. Constipation, unspecified constipation type Discussed use of higher doses of miralax and increasing fiber. Patient instructions provided. RTC precautions given.   3. Moderate episode of recurrent major depressive disorder ([name]) 4. Generalized anxiety disorder Controlled. Continue current regime.   Return in about 6 months (around 06/21/2019).    Rutherford Guys, MD Primary [facility] at [facility] [place], [place] [address] Ph.  [phone] Fax [phone]

## 2018-12-25 NOTE — Telephone Encounter (Signed)
Charge entry for pt has been entered, will call pt to collect.

## 2018-12-25 NOTE — Telephone Encounter (Signed)
Forms have been received and completed by provider. I have given the forms to Diana Thomas to contact pt about the fee for the [facility] form before it can be faxed off.

## 2018-12-26 NOTE — Telephone Encounter (Signed)
Called pt to collect fee. Left generic vm to cb

## 2018-12-28 NOTE — Telephone Encounter (Signed)
Fee was collected over phone and pprwrk was given to [place] to fax

## 2019-01-30 ENCOUNTER — Telehealth: Payer: Self-pay | Admitting: Emergency Medicine

## 2019-01-30 ENCOUNTER — Ambulatory Visit (INDEPENDENT_AMBULATORY_CARE_PROVIDER_SITE_OTHER): Payer: 59 | Admitting: Family Medicine

## 2019-01-30 ENCOUNTER — Other Ambulatory Visit: Payer: Self-pay

## 2019-01-30 DIAGNOSIS — Z3042 Encounter for surveillance of injectable contraceptive: Secondary | ICD-10-CM

## 2019-01-30 LAB — POCT URINE PREGNANCY: Preg Test, Ur: NEGATIVE

## 2019-01-30 MED ORDER — MEDROXYPROGESTERONE ACETATE 150 MG/ML IM SUSP: 150.0000 mg | INTRAMUSCULAR | 0 refills | Status: DC

## 2019-01-30 NOTE — Progress Notes (Signed)
Nurse visit

## 2019-01-30 NOTE — Telephone Encounter (Signed)
Patient is here for her depo shot we did not have any in stock and patientwas informed we can sent it into the pharm and she can bring it back for [place] to administer it and patient stated her mom is a Marine scientist. Spoke to mom to confirm she was a nurse to make sure the injection would be admidminister correctly. Mom aasured me she was a Marine scientist and it would administered correctly. Per Dr Pamella Pert this was ok to to send in as long as her pregnancy test was negative.

## 2019-02-06 DIAGNOSIS — B349 Viral infection, unspecified: Secondary | ICD-10-CM | POA: Diagnosis not present

## 2019-02-06 DIAGNOSIS — Z79899 Other long term (current) drug therapy: Secondary | ICD-10-CM | POA: Diagnosis not present

## 2019-02-06 DIAGNOSIS — R1084 Generalized abdominal pain: Secondary | ICD-10-CM | POA: Diagnosis not present

## 2019-02-06 DIAGNOSIS — R112 Nausea with vomiting, unspecified: Secondary | ICD-10-CM | POA: Diagnosis not present

## 2019-02-06 DIAGNOSIS — Z20828 Contact with and (suspected) exposure to other viral communicable diseases: Secondary | ICD-10-CM | POA: Diagnosis not present

## 2019-02-22 ENCOUNTER — Ambulatory Visit (INDEPENDENT_AMBULATORY_CARE_PROVIDER_SITE_OTHER): Payer: 59 | Admitting: Family Medicine

## 2019-02-22 ENCOUNTER — Other Ambulatory Visit: Payer: Self-pay

## 2019-02-22 DIAGNOSIS — Z3042 Encounter for surveillance of injectable contraceptive: Secondary | ICD-10-CM | POA: Diagnosis not present

## 2019-02-22 LAB — POCT URINE PREGNANCY: Preg Test, Ur: NEGATIVE

## 2019-02-22 MED ORDER — MEDROXYPROGESTERONE ACETATE 150 MG/ML IM SUSP
150.0000 mg | Freq: Once | INTRAMUSCULAR | Status: AC
  Administered 2019-02-22: 150 mg via INTRAMUSCULAR

## 2019-02-22 MED ORDER — MEDROXYPROGESTERONE ACETATE 150 MG/ML IM SUSP: 150.0000 mg | Freq: Once | INTRAMUSCULAR | Status: DC

## 2019-02-22 MED FILL — medroxyPROGESTERone ACETATE: 150 | 90 days supply | Qty: 1 | Fill #0

## 2019-02-22 NOTE — Patient Instructions (Signed)
° ° ° °  If you have lab work done today you will be contacted with your lab results within the next 2 weeks.  If you have not heard from us then please contact us. The fastest way to get your results is to register for My Chart. ° ° °IF you received an x-ray today, you will receive an invoice from [facility]. Please contact [facility] at [phone] with questions or concerns regarding your invoice.  ° °IF you received labwork today, you will receive an invoice from [facility]. Please contact [facility] at [phone] with questions or concerns regarding your invoice.  ° °Our billing staff will not be able to assist you with questions regarding bills from these companies. ° °You will be contacted with the lab results as soon as they are available. The fastest way to get your results is to activate your My Chart account. Instructions are located on the last page of this paperwork. If you have not heard from us regarding the results in 2 weeks, please contact this office. °  ° ° ° °

## 2019-02-23 ENCOUNTER — Other Ambulatory Visit: Payer: Self-pay | Admitting: Family Medicine

## 2019-02-23 MED FILL — ESCITALOPRAM 20 MG TABLET: 20 | 90 days supply | Qty: 90 | Fill #1

## 2019-02-23 MED FILL — PANTOPRAZOLE SOD DR 40 MG T: 40 | 45 days supply | Qty: 90 | Fill #1

## 2019-02-23 NOTE — Telephone Encounter (Signed)
   Notes to clinic:  This med unable to be delegated.    Requested Prescriptions  Pending Prescriptions Disp Refills   promethazine (PHENERGAN) 12.5 MG tablet [Pharmacy Med Name: PROMETHAZINE 12.5 MG TABLET 12.5 Tablet] 20 tablet 1    Sig: Take 1 tablet (12.5 mg total) by mouth every 8 (eight) hours as needed for nausea or vomiting.      Not Delegated - Gastroenterology: Antiemetics Failed - 02/23/2019  1:46 PM      Failed - This refill cannot be delegated      Passed - Valid encounter within last 6 months    Recent Outpatient Visits           Yesterday Encounter for surveillance of injectable contraceptive   [facility], Meda Coffee, MD   3 weeks ago Encounter for surveillance of injectable contraceptive   [facility], Meda Coffee, MD   2 months ago Gastroesophageal reflux disease without esophagitis   [facility], Meda Coffee, MD   4 months ago Gastroesophageal reflux disease without esophagitis   [facility], Meda Coffee, MD   5 months ago Well adult exam   [facility] at [facility], Minerva Fester, MD

## 2019-02-26 MED FILL — PROMETHAZINE 12.5 MG TABLET: 12.5 | 6 days supply | Qty: 20 | Fill #0

## 2019-05-05 IMAGING — CR DG ABDOMEN ACUTE W/ 1V CHEST
3 series · 3 of 3 positions shown · non-contrast
Comparison: Prior CT from 06/20/2017.

CLINICAL DATA: Initial evaluation for acute abdominal pain, emesis,
dizziness.

EXAM:
DG ABDOMEN ACUTE W/ 1V CHEST

[w chest pa]
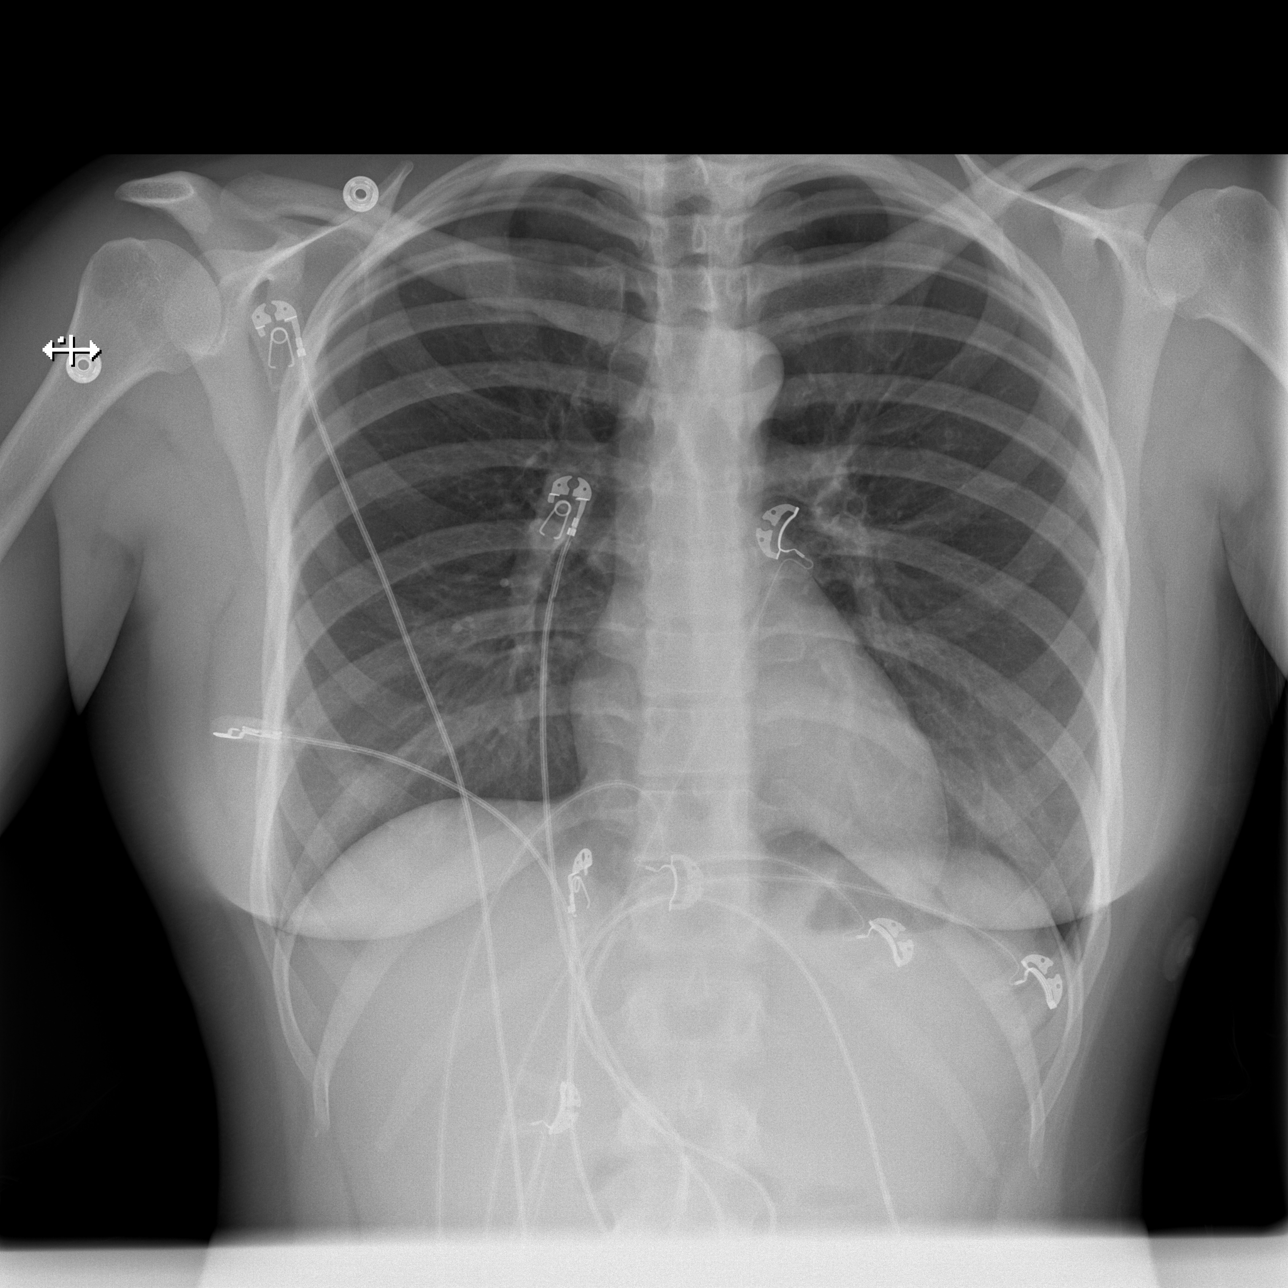

[w abdomen upright]
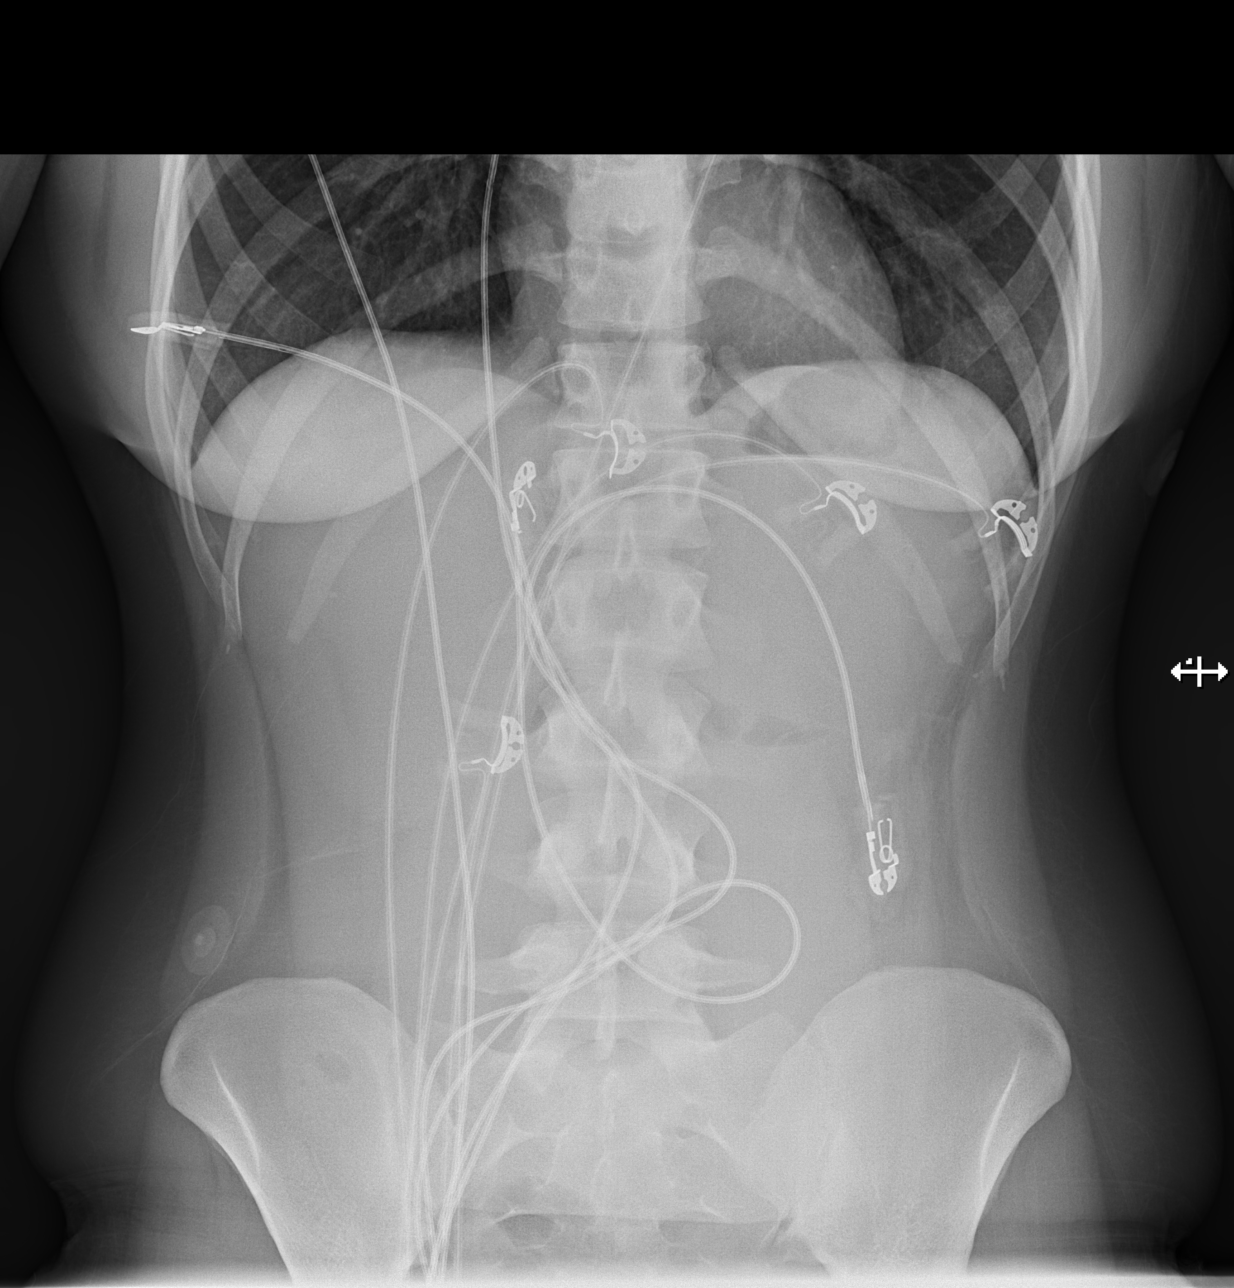

[t abdomen supine]
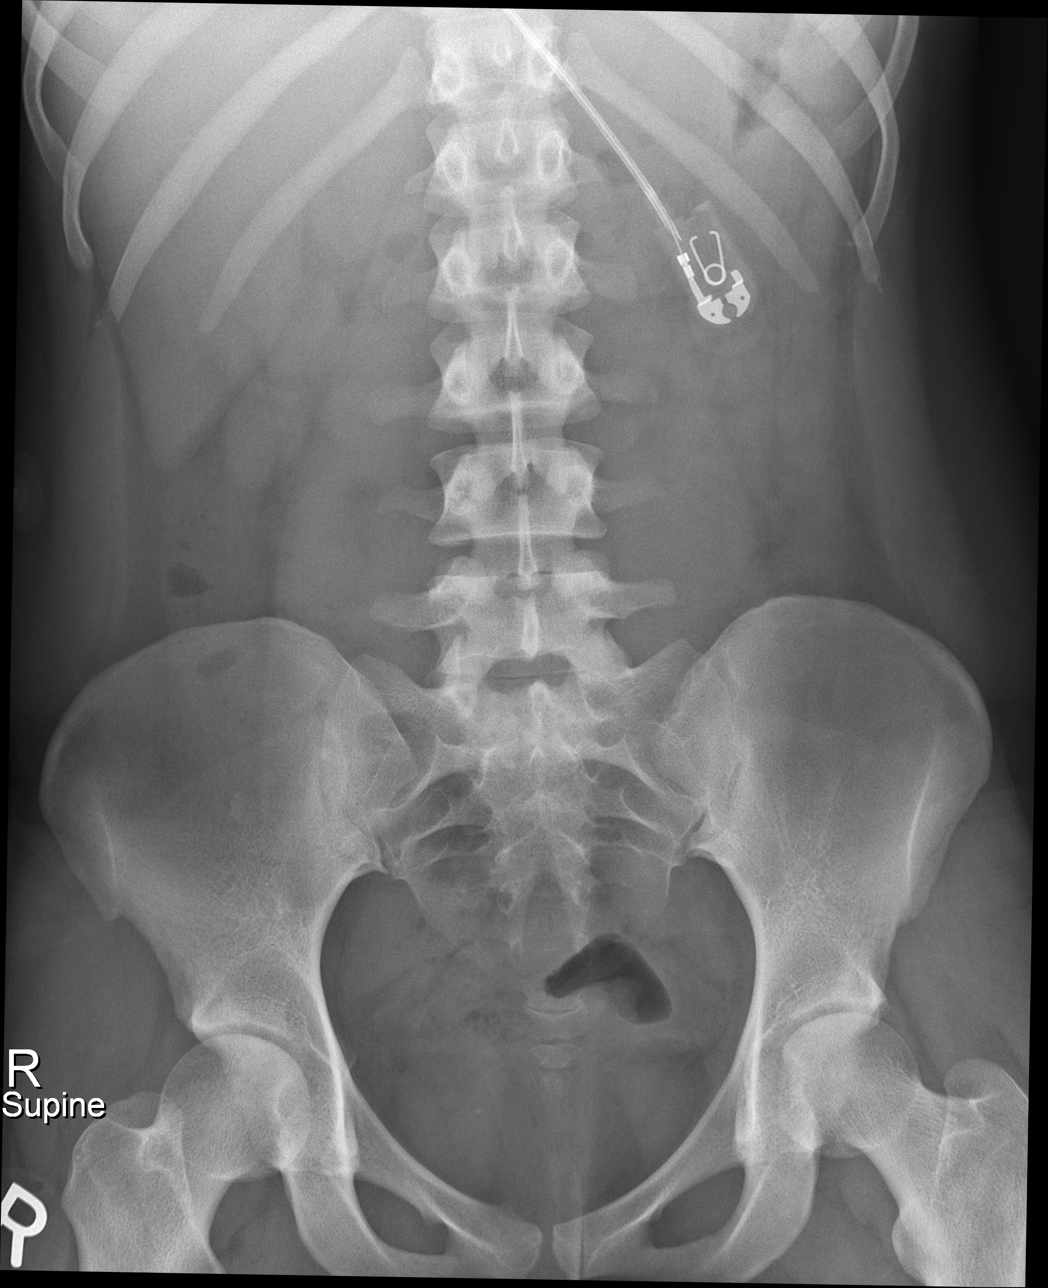

[3 of 3 positions shown; findings below may reference images not displayed]

FINDINGS: Paucity of gas limits evaluation of the bowels. There is no evidence
of dilated bowel loops or free intraperitoneal air. Punctate
calcification overlying the liver noted, felt to be to lateral to
reflect underlying cholelithiasis. This may lie within the fecal
stream. No other radiopaque calculi or soft tissue mass identified.
Heart size and mediastinal contours are within normal limits. Both
lungs are clear.
IMPRESSION: Negative abdominal radiographs.  No acute cardiopulmonary disease.

## 2019-05-05 IMAGING — US US ABDOMEN LIMITED
1 series · 14 of 25 positions shown · non-contrast
Comparison: None.

CLINICAL DATA: Initial evaluation for acute right upper quadrant
and epigastric abdominal pain, vomiting.

EXAM:
ULTRASOUND ABDOMEN LIMITED RIGHT UPPER QUADRANT

[Series 1: us abdomen limited · 14 of 66 slices shown]
[im 1/66]
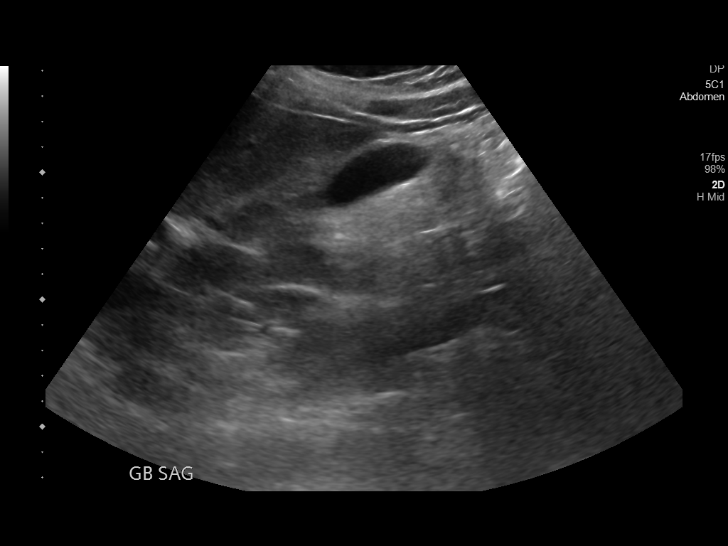
[im 6/66]
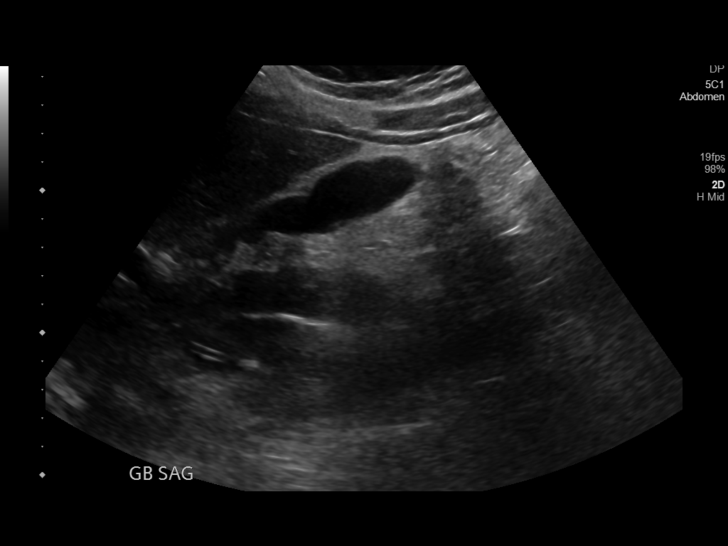
[im 11/66]
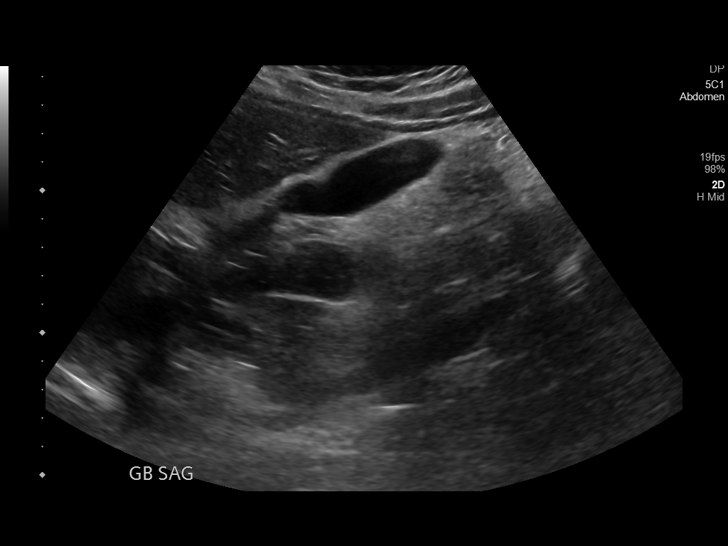
[im 17/66]
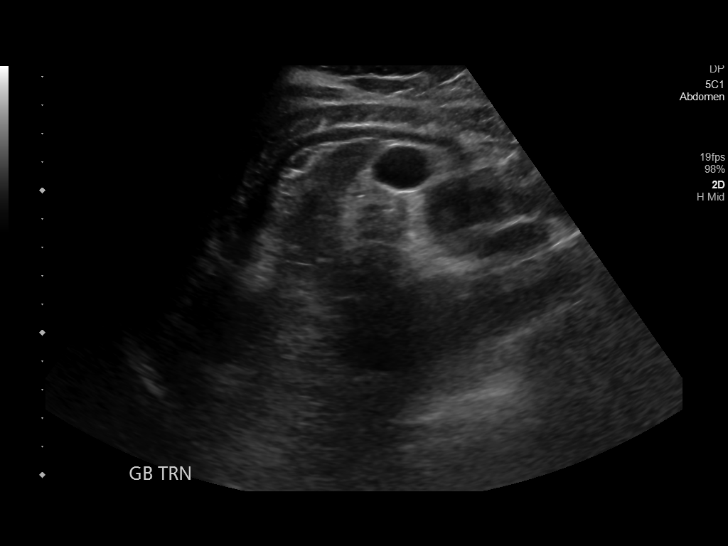
[im 22/66]
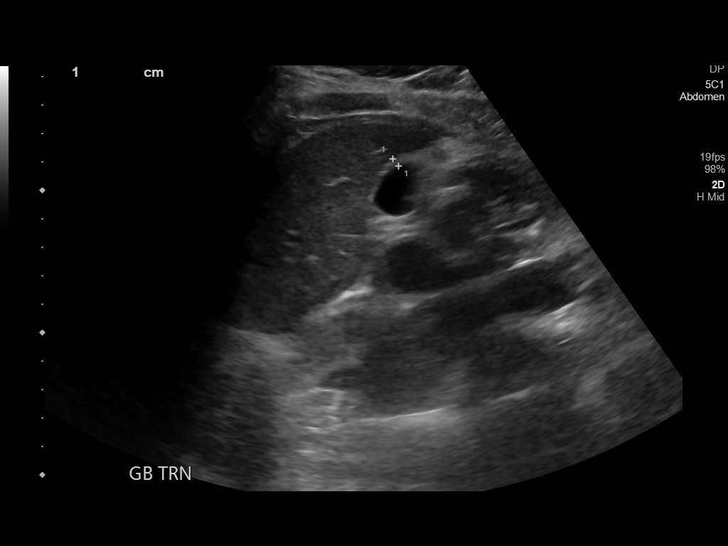
[im 25/66]
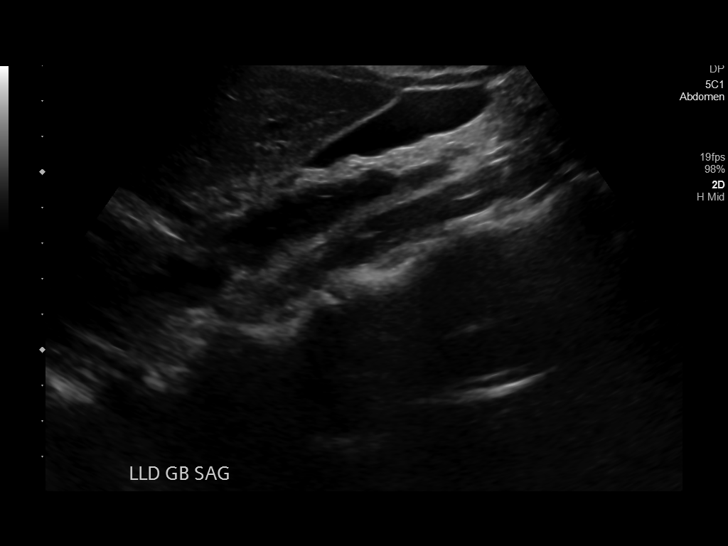
[im 30/66]
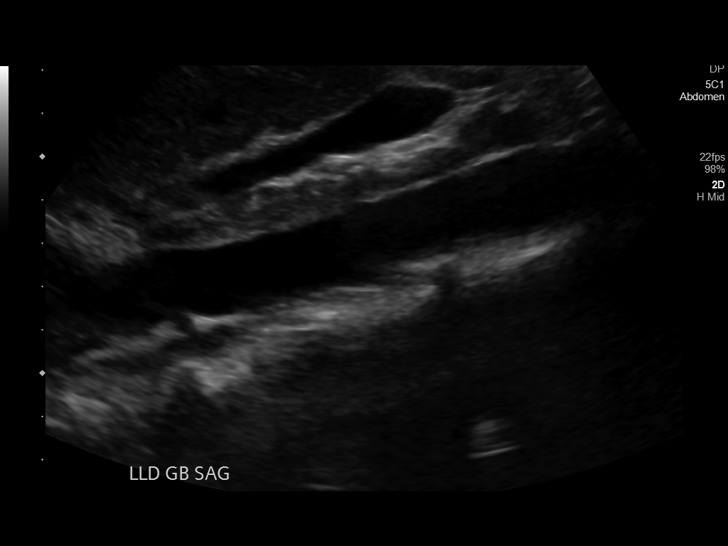
[im 36/66]
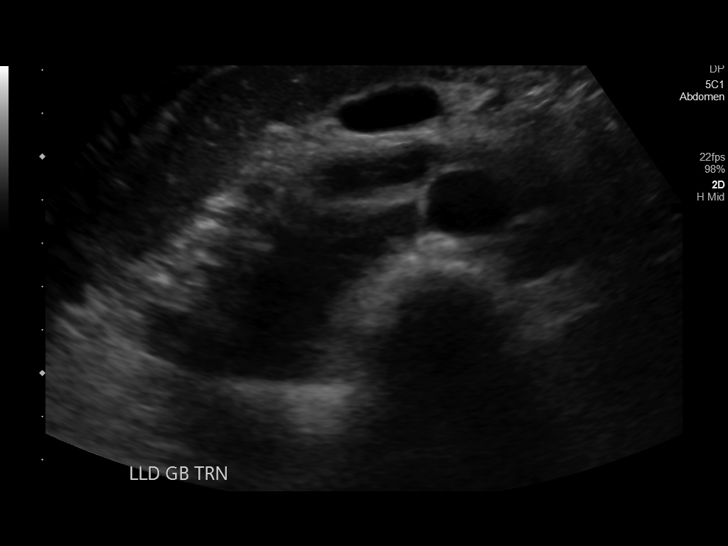
[im 41/66]
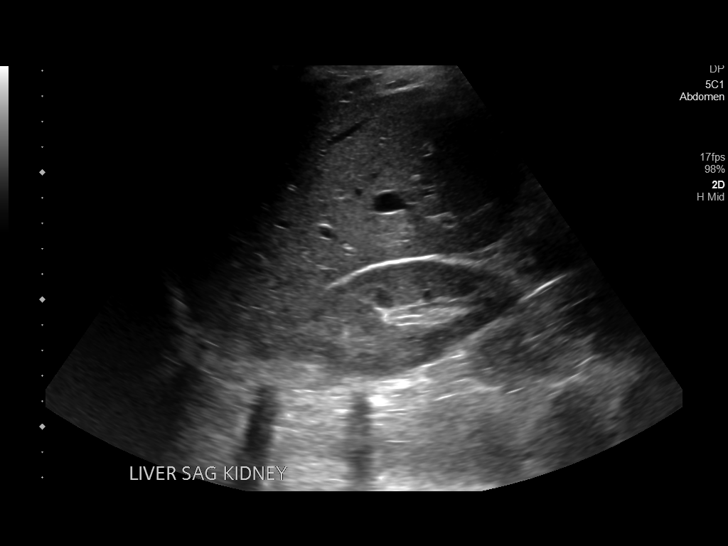
[im 44/66]
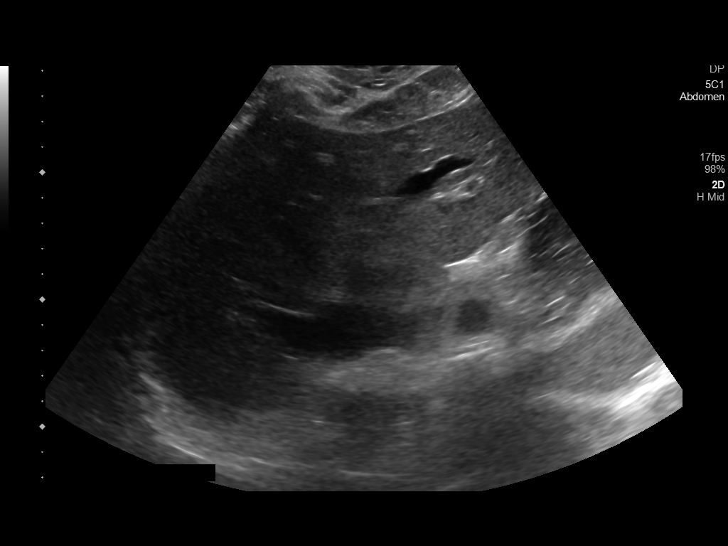
[im 49/66]
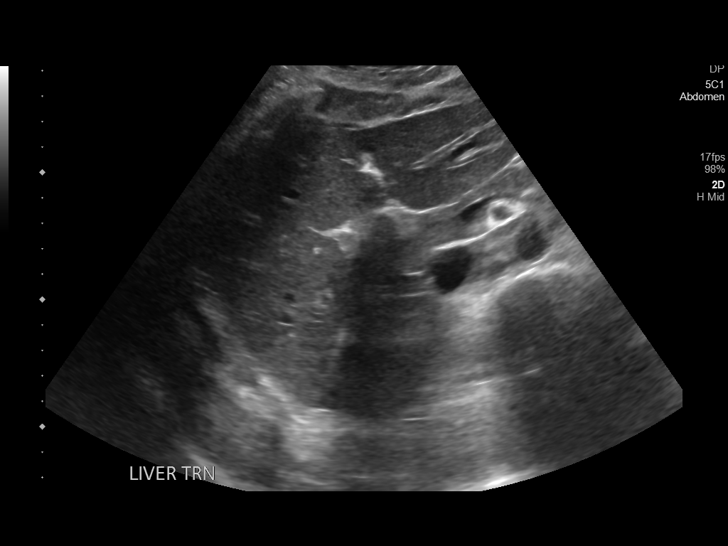
[im 55/66]
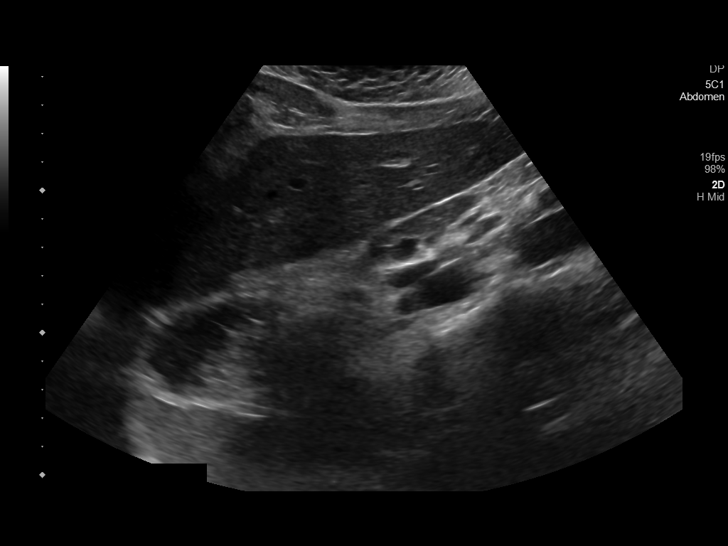
[im 60/66]
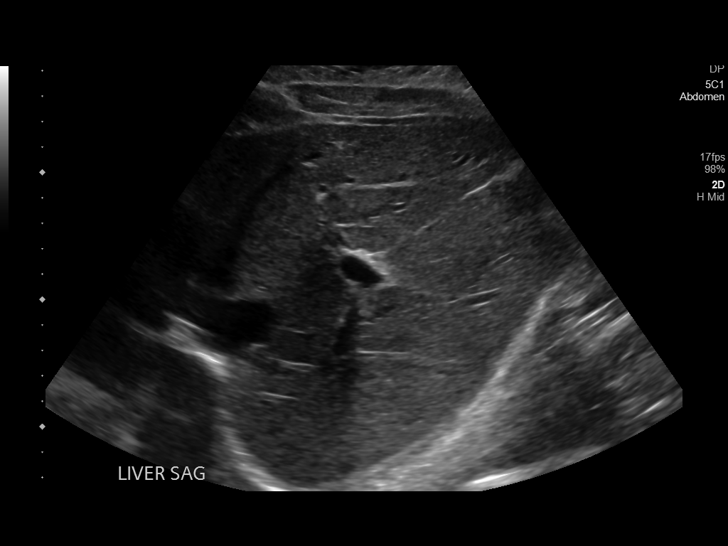
[im 66/66]
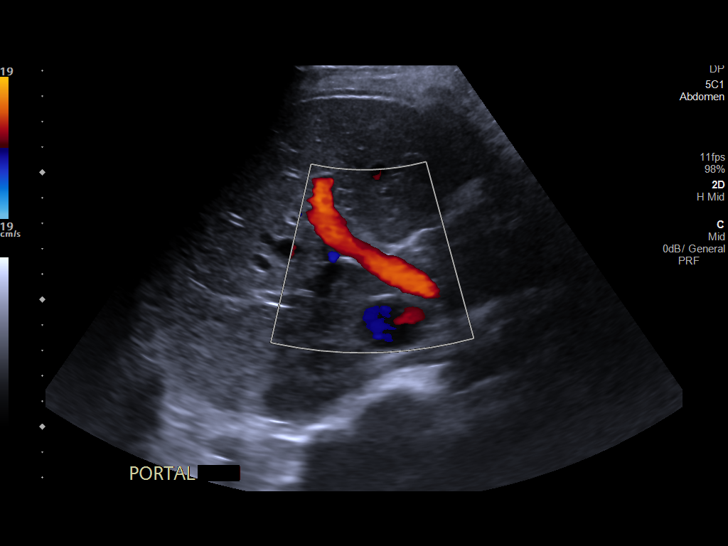

[14 of 25 positions shown; findings below may reference images not displayed]

FINDINGS: Gallbladder:

No gallstones or wall thickening visualized. No sonographic Murphy
sign noted by sonographer.

Common bile duct:

Diameter: 3.0 mm

Liver:

No focal lesion identified. Within normal limits in parenchymal
echogenicity. Portal vein is patent on color Doppler imaging with
normal direction of blood flow towards the liver.
IMPRESSION: Normal right upper quadrant ultrasound.

## 2019-05-06 ENCOUNTER — Emergency Department (HOSPITAL_COMMUNITY)
Admission: EM | Admit: 2019-05-06 | Discharge: 2019-05-06 | Disposition: A | Discharge status: Home / Self Care | Payer: 59 | Attending: Emergency Medicine | Admitting: Emergency Medicine

## 2019-05-06 ENCOUNTER — Other Ambulatory Visit: Payer: Self-pay

## 2019-05-06 DIAGNOSIS — Z79899 Other long term (current) drug therapy: Secondary | ICD-10-CM | POA: Insufficient documentation

## 2019-05-06 DIAGNOSIS — R112 Nausea with vomiting, unspecified: Secondary | ICD-10-CM | POA: Insufficient documentation

## 2019-05-06 DIAGNOSIS — F12188 Cannabis abuse with other cannabis-induced disorder: Secondary | ICD-10-CM | POA: Diagnosis not present

## 2019-05-06 DIAGNOSIS — F1290 Cannabis use, unspecified, uncomplicated: Secondary | ICD-10-CM | POA: Diagnosis not present

## 2019-05-06 DIAGNOSIS — Z793 Long term (current) use of hormonal contraceptives: Secondary | ICD-10-CM | POA: Diagnosis not present

## 2019-05-06 LAB — CBC WITH DIFFERENTIAL/PLATELET
Abs Immature Granulocytes: 0.06 10*3/uL (ref 0.00–0.07)
Basophils Absolute: 0 10*3/uL (ref 0.0–0.1)
Basophils Relative: 0 %
Eosinophils Absolute: 0 10*3/uL (ref 0.0–0.5)
Eosinophils Relative: 0 %
HCT: 40 % (ref 36.0–46.0)
Hemoglobin: 13 g/dL (ref 12.0–15.0)
Immature Granulocytes: 0 %
Lymphocytes Relative: 5 %
Lymphs Abs: 0.9 10*3/uL (ref 0.7–4.0)
MCH: 28.8 pg (ref 26.0–34.0)
MCHC: 32.5 g/dL (ref 30.0–36.0)
MCV: 88.7 fL (ref 80.0–100.0)
Monocytes Absolute: 0.5 10*3/uL (ref 0.1–1.0)
Monocytes Relative: 3 %
Neutro Abs: 16.3 10*3/uL — ABNORMAL HIGH (ref 1.7–7.7)
Neutrophils Relative %: 92 %
Platelets: 255 10*3/uL (ref 150–400)
RBC: 4.51 MIL/uL (ref 3.87–5.11)
RDW: 12.6 % (ref 11.5–15.5)
WBC: 17.7 10*3/uL — ABNORMAL HIGH (ref 4.0–10.5)
nRBC: 0 % (ref 0.0–0.2)

## 2019-05-06 LAB — RAPID URINE DRUG SCREEN, HOSP PERFORMED
Amphetamines: NOT DETECTED
Barbiturates: NOT DETECTED
Benzodiazepines: NOT DETECTED
Cocaine: NOT DETECTED
Opiates: NOT DETECTED
Tetrahydrocannabinol: POSITIVE — AB

## 2019-05-06 LAB — COMPREHENSIVE METABOLIC PANEL
ALT: 24 U/L (ref 0–44)
AST: 30 U/L (ref 15–41)
Albumin: 5 g/dL (ref 3.5–5.0)
Alkaline Phosphatase: 61 U/L (ref 38–126)
Anion gap: 14 (ref 5–15)
BUN: 13 mg/dL (ref 6–20)
CO2: 19 mmol/L — ABNORMAL LOW (ref 22–32)
Calcium: 9.6 mg/dL (ref 8.9–10.3)
Chloride: 106 mmol/L (ref 98–111)
Creatinine, Ser: 0.8 mg/dL (ref 0.44–1.00)
GFR calc Af Amer: >60 mL/min (ref >60)
GFR calc non Af Amer: >60 mL/min (ref >60)
Glucose, Bld: 182 mg/dL — ABNORMAL HIGH (ref 70–99)
Potassium: 3.3 mmol/L — ABNORMAL LOW (ref 3.5–5.1)
Sodium: 139 mmol/L (ref 135–145)
Total Bilirubin: 0.6 mg/dL (ref 0.3–1.2)
Total Protein: 8.5 g/dL — ABNORMAL HIGH (ref 6.5–8.1)

## 2019-05-06 LAB — URINALYSIS, ROUTINE W REFLEX MICROSCOPIC
Bacteria, UA: NONE SEEN
Bilirubin Urine: NEGATIVE
Glucose, UA: 150 mg/dL — AB
Ketones, ur: 20 mg/dL — AB
Leukocytes,Ua: NEGATIVE
Nitrite: NEGATIVE
Protein, ur: 30 mg/dL — AB
Specific Gravity, Urine: 1.033 — ABNORMAL HIGH (ref 1.005–1.030)
pH: 7 (ref 5.0–8.0)

## 2019-05-06 LAB — HCG, QUANTITATIVE, PREGNANCY: hCG, Beta Chain, Quant, S: <1 m[IU]/mL (ref <5)

## 2019-05-06 LAB — LIPASE, BLOOD: Lipase: 20 U/L (ref 11–51)

## 2019-05-06 MED ORDER — ONDANSETRON HCL 4 MG/2ML IJ SOLN
4.0000 mg | Freq: Once | INTRAMUSCULAR | Status: AC
  Administered 2019-05-06: 4 mg via INTRAVENOUS
  Filled 2019-05-06: qty 2

## 2019-05-06 MED ORDER — SODIUM CHLORIDE 0.9 % IV BOLUS
1000.0000 mL | Freq: Once | INTRAVENOUS | Status: AC
  Administered 2019-05-06: 1000 mL via INTRAVENOUS

## 2019-05-06 NOTE — ED Provider Notes (Signed)
[facility]-EMERGENCY DEPT Provider Note   CSN: [id number] Arrival date & time: 05/06/19  0457     History Chief Complaint  Patient presents with  . Emesis    Diana Thomas is a 24 y.o. female.  HPI     This is a 24 year old female with a history of gastritis, reflux who presents with nausea and vomiting.  Patient reports onset of symptoms yesterday evening.  Patient reports she is unable to keep anything down.  She took 8 mg of Zofran with Phenergan with minimal relief.  She denies any abdominal pain or diarrhea.  Denies any fevers.  Denies any illicit drug or alcohol use.  Reports she has had similar symptoms in the past and has been treated symptomatically.  She doubts she is pregnant as she is on birth control.  Denies fever, cough, upper respiratory symptoms.  Past Medical History:  Diagnosis Date  . Allergy   . Anemia   . Anxiety   . Depression   . Gastritis   . GERD (gastroesophageal reflux disease)   . Heart murmur     Patient Active Problem List   Diagnosis Date Noted  . Moderate episode of recurrent major depressive disorder ([facility]) 04/29/2018  . Gastroesophageal reflux disease without esophagitis 04/29/2018  . Generalized anxiety disorder 09/16/2017    Past Surgical History:  Procedure Laterality Date  . NO PAST SURGERIES       OB History   No obstetric history on file.     Family History  Problem Relation Age of Onset  . Heart disease Mother   . Hypertension Mother   . Hyperlipidemia Mother   . ADD / ADHD Father   . Healthy Father   . Healthy Sister   . Liver disease Maternal Grandmother   . Diabetes Maternal Uncle   . Colon cancer Neg Hx   . Esophageal cancer Neg Hx   . Stomach cancer Neg Hx   . Rectal cancer Neg Hx     Social History   Tobacco Use  . Smoking status: Never Smoker  . Smokeless tobacco: Never Used  Substance Use Topics  . Alcohol use: Yes    Comment: occasional drinker  . Drug use: Never     Home Medications Prior to Admission medications   Medication Sig Start Date End Date Taking? Authorizing Provider  busPIRone (BUSPAR) 15 MG tablet Take 1 tablet (15 mg total) by mouth at bedtime. 10/12/18   Myles Lipps, MD  escitalopram (LEXAPRO) 20 MG tablet Take 1 tablet (20 mg total) by mouth at bedtime. 10/12/18   Myles Lipps, MD  FIBER PO Take by mouth daily.    [provider]  medroxyPROGESTERone (DEPO-PROVERA) 150 MG/ML injection Inject 1 mL (150 mg total) into the muscle every 3 (three) months. 01/30/19   Myles Lipps, MD  ondansetron (ZOFRAN ODT) 8 MG disintegrating tablet Take 1 tablet (8 mg total) by mouth every 8 (eight) hours as needed for nausea or vomiting. 05/30/18   Myles Lipps, MD  pantoprazole (PROTONIX) 40 MG tablet Take 1-2 tablets (40-80 mg total) by mouth daily. 10/12/18   Myles Lipps, MD  promethazine (PHENERGAN) 12.5 MG tablet TAKE 1 TABLET (12.5 MG TOTAL) BY MOUTH EVERY 8 (EIGHT) HOURS AS NEEDED FOR NAUSEA OR VOMITING. 02/26/19   Myles Lipps, MD    Allergies    Patient has no known allergies.  Review of Systems   Review of Systems  Constitutional: Negative for  fever.  Respiratory: Negative for shortness of breath.   Cardiovascular: Negative for chest pain.  Gastrointestinal: Positive for nausea and vomiting. Negative for abdominal pain, constipation and diarrhea.  Genitourinary: Negative for dysuria.  All other systems reviewed and are negative.   Physical Exam Updated Vital Signs BP (!) 148/106   Pulse 66   Temp 98.4 F (36.9 C) (Oral)   Resp 13   Ht 1.651 m (5\' 5" )   Wt 74.8 kg   SpO2 98%   BMI 27.46 kg/m   Physical Exam Vitals and nursing note reviewed.  Constitutional:      Appearance: She is well-developed. She is not ill-appearing.  HENT:     Head: Normocephalic and atraumatic.     Nose: No congestion.     Mouth/Throat:     Mouth: Mucous membranes are moist.  Eyes:     Extraocular Movements: Extraocular  movements intact.     Pupils: Pupils are equal, round, and reactive to light.  Cardiovascular:     Rate and Rhythm: Normal rate and regular rhythm.     Heart sounds: Normal heart sounds.  Pulmonary:     Effort: Pulmonary effort is normal. No respiratory distress.     Breath sounds: No wheezing.  Abdominal:     General: Bowel sounds are normal.     Palpations: Abdomen is soft.  Musculoskeletal:     Cervical back: Neck supple.  Skin:    General: Skin is warm and dry.  Neurological:     Mental Status: She is alert and oriented to person, place, and time.     ED Results / Procedures / Treatments   Labs (all labs ordered are listed, but only abnormal results are displayed) Labs Reviewed  CBC WITH DIFFERENTIAL/PLATELET - Abnormal; Notable for the following components:      Result Value   WBC 17.7 (*)    Neutro Abs 16.3 (*)    All other components within normal limits  COMPREHENSIVE METABOLIC PANEL - Abnormal; Notable for the following components:   Potassium 3.3 (*)    CO2 19 (*)    Glucose, Bld 182 (*)    Total Protein 8.5 (*)    All other components within normal limits  URINALYSIS, ROUTINE W REFLEX MICROSCOPIC - Abnormal; Notable for the following components:   Specific Gravity, Urine 1.033 (*)    Glucose, UA 150 (*)    Hgb urine dipstick SMALL (*)    Ketones, ur 20 (*)    Protein, ur 30 (*)    All other components within normal limits  RAPID URINE DRUG SCREEN, HOSP PERFORMED - Abnormal; Notable for the following components:   Tetrahydrocannabinol POSITIVE (*)    All other components within normal limits  LIPASE, BLOOD  HCG, QUANTITATIVE, PREGNANCY  I-STAT BETA HCG BLOOD, ED ([facility], [facility], [facility] ONLY)    EKG None  Radiology No results found.  Procedures Procedures (including critical care time)  Medications Ordered in ED Medications  sodium chloride 0.9 % bolus 1,000 mL (1,000 mLs Intravenous New Bag/Given 05/06/19 0548)  ondansetron (ZOFRAN) injection 4 mg (4 mg  Intravenous Given 05/06/19 0546)    ED Course  I have reviewed the triage vital signs and the nursing notes.  Pertinent labs & imaging results that were available during my care of the patient were reviewed by me and considered in my medical decision making (see chart for details).  Clinical Course as of May 06 750  Sun May 06, 2019  0744 Confronted patient  regarding her positive UDS.  She now admits to marijuana use.  Unclear whether she is being forthcoming with how often she uses.  I discussed with her that there can sometimes be association with hyperemesis.  Encouraged her to discontinue use.  She is ready to drink.   [CH]    Clinical Course User Index [CH] , Mayer Masker, MD   MDM Rules/Calculators/A&P                       Patient presents with nausea and vomiting.  Abdomen is soft and nontender.  She denies any alcohol or drug use however does have a history of the same.  Reports gastritis and reflux.  Patient was given pain and nausea medication as well as fluids.  Lab work notable for nonspecific leukocytosis.  Abdomen remains benign.  Low suspicion for appendicitis or cholecystitis.  She is not pregnant.  She is positive for THC.  Otherwise she has some mild metabolic derangements with slightly low potassium and a glucose of 182 without evidence of DKA.  See clinical course above.  Patient improved with treatment.  Suspect that this may be related to cannabinoid hyperemesis syndrome.  I discussed this with patient.  She was encouraged to discontinue marijuana use.  After history, exam, and medical workup I feel the patient has been appropriately medically screened and is safe for discharge home. Pertinent diagnoses were discussed with the patient. Patient was given return precautions.   Final Clinical Impression(s) / ED Diagnoses Final diagnoses:  Non-intractable vomiting with nausea, unspecified vomiting type  Cannabinoid hyperemesis syndrome    Rx / DC Orders ED  Discharge Orders    None       Shon Baton, MD 05/06/19 [phone]

## 2019-05-06 NOTE — Discharge Instructions (Addendum)
You were seen today for nausea and vomiting.  Nausea and vomiting can be related to marijuana usage.  You should discontinue marijuana usage.  Continue Phenergan or Zofran as needed.  Make sure that you are staying hydrated.

## 2019-05-08 MED FILL — ONDANSETRON ODT 8 MG TABLET: 8 | 20 days supply | Qty: 20 | Fill #3

## 2019-05-08 MED FILL — PROMETHAZINE 12.5 MG TABLET: 12.5 | 6 days supply | Qty: 20 | Fill #0

## 2019-05-30 ENCOUNTER — Emergency Department (HOSPITAL_COMMUNITY): Admission: EM | Admit: 2019-05-30 | Discharge: 2019-05-30 | Discharge status: Against Medical Advice | Payer: 59

## 2019-05-31 ENCOUNTER — Emergency Department (HOSPITAL_COMMUNITY)
Admission: EM | Admit: 2019-05-31 | Discharge: 2019-05-31 | Disposition: A | Discharge status: Home / Self Care | Payer: 59 | Source: Home / Self Care | Attending: Emergency Medicine | Admitting: Emergency Medicine

## 2019-05-31 ENCOUNTER — Encounter (HOSPITAL_COMMUNITY): Payer: Self-pay

## 2019-05-31 ENCOUNTER — Emergency Department (HOSPITAL_COMMUNITY)
Admission: EM | Admit: 2019-05-31 | Discharge: 2019-05-31 | Disposition: A | Discharge status: Home / Self Care | Payer: 59 | Attending: Emergency Medicine | Admitting: Emergency Medicine

## 2019-05-31 DIAGNOSIS — R1084 Generalized abdominal pain: Secondary | ICD-10-CM | POA: Diagnosis not present

## 2019-05-31 DIAGNOSIS — R112 Nausea with vomiting, unspecified: Secondary | ICD-10-CM | POA: Diagnosis not present

## 2019-05-31 DIAGNOSIS — R1010 Upper abdominal pain, unspecified: Secondary | ICD-10-CM | POA: Insufficient documentation

## 2019-05-31 DIAGNOSIS — Z79899 Other long term (current) drug therapy: Secondary | ICD-10-CM | POA: Insufficient documentation

## 2019-05-31 DIAGNOSIS — R1013 Epigastric pain: Secondary | ICD-10-CM

## 2019-05-31 DIAGNOSIS — I10 Essential (primary) hypertension: Secondary | ICD-10-CM | POA: Diagnosis not present

## 2019-05-31 LAB — URINALYSIS, ROUTINE W REFLEX MICROSCOPIC
Bilirubin Urine: NEGATIVE
Glucose, UA: NEGATIVE mg/dL
Hgb urine dipstick: NEGATIVE
Ketones, ur: 20 mg/dL — AB
Leukocytes,Ua: NEGATIVE
Nitrite: NEGATIVE
Protein, ur: 100 mg/dL — AB
Specific Gravity, Urine: 1.034 — ABNORMAL HIGH (ref 1.005–1.030)
pH: 5 (ref 5.0–8.0)

## 2019-05-31 LAB — COMPREHENSIVE METABOLIC PANEL
ALT: 25 U/L (ref 0–44)
AST: 27 U/L (ref 15–41)
Albumin: 4.4 g/dL (ref 3.5–5.0)
Alkaline Phosphatase: 60 U/L (ref 38–126)
Anion gap: 15 (ref 5–15)
BUN: 12 mg/dL (ref 6–20)
CO2: 19 mmol/L — ABNORMAL LOW (ref 22–32)
Calcium: 9.6 mg/dL (ref 8.9–10.3)
Chloride: 103 mmol/L (ref 98–111)
Creatinine, Ser: 0.87 mg/dL (ref 0.44–1.00)
GFR calc Af Amer: >60 mL/min (ref >60)
GFR calc non Af Amer: >60 mL/min (ref >60)
Glucose, Bld: 141 mg/dL — ABNORMAL HIGH (ref 70–99)
Potassium: 3.1 mmol/L — ABNORMAL LOW (ref 3.5–5.1)
Sodium: 137 mmol/L (ref 135–145)
Total Bilirubin: 0.6 mg/dL (ref 0.3–1.2)
Total Protein: 8.1 g/dL (ref 6.5–8.1)

## 2019-05-31 LAB — RAPID URINE DRUG SCREEN, HOSP PERFORMED
Amphetamines: NOT DETECTED
Barbiturates: NOT DETECTED
Benzodiazepines: NOT DETECTED
Cocaine: NOT DETECTED
Opiates: NOT DETECTED
Tetrahydrocannabinol: NOT DETECTED

## 2019-05-31 LAB — CBC
HCT: 42.4 % (ref 36.0–46.0)
Hemoglobin: 14 g/dL (ref 12.0–15.0)
MCH: 28.3 pg (ref 26.0–34.0)
MCHC: 33 g/dL (ref 30.0–36.0)
MCV: 85.7 fL (ref 80.0–100.0)
Platelets: 314 10*3/uL (ref 150–400)
RBC: 4.95 MIL/uL (ref 3.87–5.11)
RDW: 12.5 % (ref 11.5–15.5)
WBC: 17.6 10*3/uL — ABNORMAL HIGH (ref 4.0–10.5)
nRBC: 0 % (ref 0.0–0.2)

## 2019-05-31 LAB — LIPASE, BLOOD: Lipase: 24 U/L (ref 11–51)

## 2019-05-31 LAB — I-STAT BETA HCG BLOOD, ED (MC, WL, AP ONLY): I-stat hCG, quantitative: <5 m[IU]/mL (ref <5)

## 2019-05-31 MED ORDER — PROMETHAZINE HCL 25 MG PO TABS: 25.0000 mg | ORAL_TABLET | Freq: Four times a day (QID) | ORAL | 0 refills | Status: DC | PRN

## 2019-05-31 MED ORDER — SODIUM CHLORIDE 0.9 % IV BOLUS (SEPSIS)
1000.0000 mL | Freq: Once | INTRAVENOUS | Status: AC
  Administered 2019-05-31: 1000 mL via INTRAVENOUS

## 2019-05-31 MED ORDER — LIDOCAINE VISCOUS HCL 2 % MT SOLN
15.0000 mL | Freq: Once | OROMUCOSAL | Status: AC
  Administered 2019-05-31: 15 mL via ORAL
  Filled 2019-05-31: qty 15

## 2019-05-31 MED ORDER — SODIUM CHLORIDE 0.9% FLUSH: 3.0000 mL | Freq: Once | INTRAVENOUS | Status: DC

## 2019-05-31 MED ORDER — HALOPERIDOL LACTATE 5 MG/ML IJ SOLN
5.0000 mg | Freq: Once | INTRAMUSCULAR | Status: AC
  Administered 2019-05-31: 5 mg via INTRAVENOUS
  Filled 2019-05-31: qty 1

## 2019-05-31 MED ORDER — DICYCLOMINE HCL 20 MG PO TABS: 20.0000 mg | ORAL_TABLET | Freq: Three times a day (TID) | ORAL | 0 refills | Status: DC | PRN

## 2019-05-31 MED ORDER — ALUM & MAG HYDROXIDE-SIMETH 200-200-20 MG/5ML PO SUSP
30.0000 mL | Freq: Once | ORAL | Status: AC
  Administered 2019-05-31: 30 mL via ORAL
  Filled 2019-05-31: qty 30

## 2019-05-31 MED ORDER — ONDANSETRON 4 MG PO TBDP: 4.0000 mg | ORAL_TABLET | Freq: Three times a day (TID) | ORAL | 0 refills | Status: DC | PRN

## 2019-05-31 MED ORDER — PANTOPRAZOLE SODIUM 40 MG IV SOLR
40.0000 mg | Freq: Once | INTRAVENOUS | Status: AC
  Administered 2019-05-31: 40 mg via INTRAVENOUS
  Filled 2019-05-31: qty 40

## 2019-05-31 MED ORDER — ONDANSETRON HCL 4 MG/2ML IJ SOLN
4.0000 mg | Freq: Once | INTRAMUSCULAR | Status: AC | PRN
  Administered 2019-05-31: 4 mg via INTRAVENOUS
  Filled 2019-05-31: qty 2

## 2019-05-31 MED ORDER — ONDANSETRON HCL 4 MG/2ML IJ SOLN
4.0000 mg | Freq: Once | INTRAMUSCULAR | Status: AC
  Administered 2019-05-31: 18:00:00 4 mg via INTRAVENOUS
  Filled 2019-05-31: qty 2

## 2019-05-31 MED ORDER — DICYCLOMINE HCL 10 MG/ML IM SOLN
20.0000 mg | Freq: Once | INTRAMUSCULAR | Status: AC
  Administered 2019-05-31: 20 mg via INTRAMUSCULAR
  Filled 2019-05-31: qty 2

## 2019-05-31 NOTE — ED Notes (Signed)
Patient and belongings not found in room. IV found in trash can. Patient left prior to receiving discharge paperwork.

## 2019-05-31 NOTE — ED Notes (Signed)
PA made aware patient reports she is ready to leave.

## 2019-05-31 NOTE — ED Triage Notes (Signed)
Pt complains of vomiting and dry heaving since Sunday, she states that she has gastritis and was drinking wine on Sunday, she's not been able to keep anything down and none of her medicines work

## 2019-05-31 NOTE — ED Notes (Signed)
Patient given water to PO challenge. 

## 2019-05-31 NOTE — ED Triage Notes (Signed)
Patient arrived via [facility] from home.   C/o abdominal pain (dull)10/10  Patient seen here earlier today and discharged.   patient given 4mg  Zofran IM  Hx. Gastritis.   A/Ox4 Ambulatory with ems   BP-150/100 P-78 RR-22 98%RA

## 2019-05-31 NOTE — ED Provider Notes (Signed)
TIME SEEN: 2:46 AM  CHIEF COMPLAINT: Upper abdominal pain, nausea and vomiting  HPI: Patient is a 24 year old female with reported history of gastritis who presents to the emergency department upper abdominal pain, nausea and vomiting that started tonight.  No fever, diarrhea, dysuria, hematuria, vaginal bleeding or discharge.  Has had the same intermittent symptoms previously.  No aggravating or alleviating factors.  She states that Dilaudid has helped significantly with her symptoms in the past.  States nausea started first but now having significant pain.  She describes it as sharp in nature without radiation.  No previous abdominal surgery.  Does appear that she was suspected previously to have cannabinoid hyperemesis syndrome.  She drove herself to the emergency department.  On review of records, it appears patient has had a normal EGD in January 2020 with [facility] by Dr. Havery Moros.  ROS: See HPI Constitutional: no fever  Eyes: no drainage  ENT: no runny nose   Cardiovascular:  no chest pain  Resp: no SOB  GI:  vomiting GU: no dysuria Integumentary: no rash  Allergy: no hives  Musculoskeletal: no leg swelling  Neurological: no slurred speech ROS otherwise negative  PAST MEDICAL HISTORY/PAST SURGICAL HISTORY:  Past Medical History:  Diagnosis Date  . Allergy   . Anemia   . Anxiety   . Depression   . Gastritis   . GERD (gastroesophageal reflux disease)   . Heart murmur     MEDICATIONS:  Prior to Admission medications   Medication Sig Start Date End Date Taking? Authorizing Provider  busPIRone (BUSPAR) 15 MG tablet Take 1 tablet (15 mg total) by mouth at bedtime. 10/12/18   Rutherford Guys, MD  escitalopram (LEXAPRO) 20 MG tablet Take 1 tablet (20 mg total) by mouth at bedtime. 10/12/18   Rutherford Guys, MD  FIBER PO Take by mouth daily.    [provider]  medroxyPROGESTERone (DEPO-PROVERA) 150 MG/ML injection Inject 1 mL (150 mg total) into the muscle every 3  (three) months. 01/30/19   Rutherford Guys, MD  ondansetron (ZOFRAN ODT) 8 MG disintegrating tablet Take 1 tablet (8 mg total) by mouth every 8 (eight) hours as needed for nausea or vomiting. 05/30/18   Rutherford Guys, MD  pantoprazole (PROTONIX) 40 MG tablet Take 1-2 tablets (40-80 mg total) by mouth daily. 10/12/18   Rutherford Guys, MD  promethazine (PHENERGAN) 12.5 MG tablet TAKE 1 TABLET (12.5 MG TOTAL) BY MOUTH EVERY 8 (EIGHT) HOURS AS NEEDED FOR NAUSEA OR VOMITING. 02/26/19   Rutherford Guys, MD    ALLERGIES:  No Known Allergies  SOCIAL HISTORY:  Social History   Tobacco Use  . Smoking status: Never Smoker  . Smokeless tobacco: Never Used  Substance Use Topics  . Alcohol use: Yes    Comment: occasional drinker    FAMILY HISTORY: Family History  Problem Relation Age of Onset  . Heart disease Mother   . Hypertension Mother   . Hyperlipidemia Mother   . ADD / ADHD Father   . Healthy Father   . Healthy Sister   . Liver disease Maternal Grandmother   . Diabetes Maternal Uncle   . Colon cancer Neg Hx   . Esophageal cancer Neg Hx   . Stomach cancer Neg Hx   . Rectal cancer Neg Hx     EXAM: BP (!) 141/93 (BP Location: Left Arm)   Pulse 68   Temp 98.2 F (36.8 C) (Oral)   Resp 18   Ht 5'  5" (1.651 m)   Wt 72.6 kg   SpO2 98%   BMI 26.63 kg/m  CONSTITUTIONAL: Alert and oriented and responds appropriately to questions.  Appears uncomfortable.  Afebrile.  Nontoxic. HEAD: Normocephalic EYES: Conjunctivae clear, pupils appear equal, EOM appear intact ENT: normal nose; dry mucous membranes NECK: Supple, normal ROM CARD: RRR; S1 and S2 appreciated; no murmurs, no clicks, no rubs, no gallops RESP: Normal chest excursion without splinting or tachypnea; breath sounds clear and equal bilaterally; no wheezes, no rhonchi, no rales, no hypoxia or respiratory distress, speaking full sentences ABD/GI: Normal bowel sounds; non-distended; soft, non-tender, no rebound, no guarding,  no peritoneal signs, no hepatosplenomegaly BACK:  The back appears normal EXT: Normal ROM in all joints; no deformity noted, no edema; no cyanosis SKIN: Normal color for age and race; warm; no rash on exposed skin NEURO: Moves all extremities equally PSYCH: The patient's mood and manner are appropriate.   MEDICAL DECISION MAKING: Patient here with abdominal pain, vomiting.  Reports history of gastritis and states symptoms feel similar.  Also question cannabinoid hyperemesis syndrome.  Abdominal exam currently benign.  Differential includes cholelithiasis, cholecystitis, pancreatitis, gastritis, GERD, PUD.  Doubt perforation.  No tenderness in the lower abdomen to suggest appendicitis, diverticulitis, UTI, PID, TOA, ectopic.  Labs, urine pending.  Will give IV fluids, Bentyl, GI cocktail, Protonix.  ED PROGRESS: Patient's labs and urine have been reviewed/interpreted.  She does have leukocytosis of 17,000 but has previously had elevated white blood cell count with similar symptoms in March.  Urine does show small amount of ketones but no sign of infection.  Her LFTs, creatinine and lipase today are normal.  Pregnancy test is negative.  Her drug screen is unremarkable.  Reports no improvement with above medications and had another episode of nonbloody, nonbilious vomiting after p.o. challenge.  She states she can call someone to pick her up from the ED.  Will give IV Haldol for pain and nausea control.  5:10 AM  Pt reports feeling much better after IV Haldol.  Able to tolerate fluids.  No further vomiting.  Pain is improved as well.  She states she will have someone pick her up from the emergency department.  Reports she has Phenergan and Zofran at home.  Will discharge with Bentyl per her request.  Recommended outpatient PCP and GI follow-up.  At this time, I do not feel there is any life-threatening condition present. I have reviewed, interpreted and discussed all results (EKG, imaging, lab, urine as  appropriate) and exam findings with patient/family. I have reviewed nursing notes and appropriate previous records.  I feel the patient is safe to be discharged home without further emergent workup and can continue workup as an outpatient as needed. Discussed usual and customary return precautions. Patient/family verbalize understanding and are comfortable with this plan.  Outpatient follow-up has been provided as needed. All questions have been answered.    Diana Thomas was evaluated in Emergency Department on 05/31/2019 for the symptoms described in the history of present illness. She was evaluated in the context of the global COVID-19 pandemic, which necessitated consideration that the patient might be at risk for infection with the SARS-CoV-2 virus that causes COVID-19. Institutional protocols and algorithms that pertain to the evaluation of patients at risk for COVID-19 are in a state of rapid change based on information released by regulatory bodies including the CDC and federal and state organizations. These policies and algorithms were followed during the patient's care in the ED.  Daniell Paradise, Layla Maw, DO 05/31/19 [phone]

## 2019-05-31 NOTE — ED Notes (Signed)
Unable to obtain accurate BP due to patient movement.

## 2019-05-31 NOTE — ED Notes (Signed)
Patient given water and saltine crackers.  

## 2019-05-31 NOTE — ED Notes (Signed)
Patient was given water to repeat PO challenge. Pt states she is feeling better now. Will continue to monitor for any episodes of N/V.

## 2019-05-31 NOTE — ED Provider Notes (Signed)
[facility] DEPT Provider Note   CSN: [id number] Arrival date & time: 05/31/19  1508     History Chief Complaint  Patient presents with  . Abdominal Pain    Diana Thomas is a 24 y.o. female with a past medical history significant for seasonal allergies, anemia, anxiety, depression, history of gastritis, and GERD who presents to the ED due to worsening upper abdominal pain associated with nausea and vomiting.  Patient was seen in the ER early this morning for the same complaint which improved after admission of Haldol.  Patient notes her abdominal pain has worsened since being discharged from home.  She admits to 2 episodes of nonbloody, nonbilious emesis.  She admits to smoking marijuana after being discharged from the hospital.  She is here again requesting Haldol because she notes it improved her symptoms earlier this morning.  Admits to nonbloody loose stools.  She also notes that this feels similar to her past episodes.  Denies fever and chills.  Per chart review, it appears her symptoms may have been related to cannabinoid hyperemesis syndrome.  Patient had a normal EGD in January 2020 [facility] by Dr. Havery Moros.  Denies vaginal and urinary symptoms.   History obtained from patient and past medical records. No interpreter used during encounter.      Past Medical History:  Diagnosis Date  . Allergy   . Anemia   . Anxiety   . Depression   . Gastritis   . GERD (gastroesophageal reflux disease)   . Heart murmur     Patient Active Problem List   Diagnosis Date Noted  . Moderate episode of recurrent major depressive disorder ([place]) 04/29/2018  . Gastroesophageal reflux disease without esophagitis 04/29/2018  . Generalized anxiety disorder 09/16/2017    Past Surgical History:  Procedure Laterality Date  . NO PAST SURGERIES       OB History   No obstetric history on file.     Family History  Problem Relation Age of Onset  . Heart  disease Mother   . Hypertension Mother   . Hyperlipidemia Mother   . ADD / ADHD Father   . Healthy Father   . Healthy Sister   . Liver disease Maternal Grandmother   . Diabetes Maternal Uncle   . Colon cancer Neg Hx   . Esophageal cancer Neg Hx   . Stomach cancer Neg Hx   . Rectal cancer Neg Hx     Social History   Tobacco Use  . Smoking status: Never Smoker  . Smokeless tobacco: Never Used  Substance Use Topics  . Alcohol use: Yes    Comment: occasional drinker  . Drug use: Never    Home Medications Prior to Admission medications   Medication Sig Start Date End Date Taking? Authorizing Provider  busPIRone (BUSPAR) 15 MG tablet Take 1 tablet (15 mg total) by mouth at bedtime. 10/12/18   Rutherford Guys, MD  dicyclomine (BENTYL) 20 MG tablet Take 1 tablet (20 mg total) by mouth every 8 (eight) hours as needed for spasms (Abdominal cramping). 05/31/19   Ward, Delice Bison, DO  escitalopram (LEXAPRO) 20 MG tablet Take 1 tablet (20 mg total) by mouth at bedtime. 10/12/18   Rutherford Guys, MD  medroxyPROGESTERone (DEPO-PROVERA) 150 MG/ML injection Inject 1 mL (150 mg total) into the muscle every 3 (three) months. 01/30/19   Rutherford Guys, MD  ondansetron (ZOFRAN ODT) 4 MG disintegrating tablet Take 1 tablet (4 mg total) by  mouth every 8 (eight) hours as needed for nausea or vomiting. 05/31/19   Mannie Stabile, PA-C  ondansetron (ZOFRAN ODT) 8 MG disintegrating tablet Take 1 tablet (8 mg total) by mouth every 8 (eight) hours as needed for nausea or vomiting. 05/30/18   Myles Lipps, MD  pantoprazole (PROTONIX) 40 MG tablet Take 1-2 tablets (40-80 mg total) by mouth daily. Patient taking differently: Take 40 mg by mouth 2 (two) times daily.  10/12/18   Myles Lipps, MD  promethazine (PHENERGAN) 12.5 MG tablet TAKE 1 TABLET (12.5 MG TOTAL) BY MOUTH EVERY 8 (EIGHT) HOURS AS NEEDED FOR NAUSEA OR VOMITING. 02/26/19   Myles Lipps, MD  promethazine (PHENERGAN) 25 MG tablet Take  1 tablet (25 mg total) by mouth every 6 (six) hours as needed for nausea or vomiting. 05/31/19   Mannie Stabile, PA-C    Allergies    Patient has no known allergies.  Review of Systems   Review of Systems  Constitutional: Negative for chills and fever.  Respiratory: Negative for shortness of breath.   Cardiovascular: Negative for chest pain.  Gastrointestinal: Positive for abdominal pain, diarrhea (loose stool), nausea and vomiting.  Genitourinary: Negative for dysuria and vaginal discharge.  Neurological: Negative for headaches.  All other systems reviewed and are negative.   Physical Exam Updated Vital Signs BP 133/84   Pulse 61   Temp 98.5 F (36.9 C) (Oral)   Resp 18   SpO2 97%   Physical Exam Vitals and nursing note reviewed.  Constitutional:      General: She is not in acute distress.    Appearance: She is not toxic-appearing.     Comments: Appears uncomfortable in bed  HENT:     Head: Normocephalic.  Eyes:     Pupils: Pupils are equal, round, and reactive to light.  Cardiovascular:     Rate and Rhythm: Normal rate and regular rhythm.     Pulses: Normal pulses.     Heart sounds: Normal heart sounds. No murmur. No friction rub. No gallop.   Pulmonary:     Effort: Pulmonary effort is normal.     Breath sounds: Normal breath sounds.  Abdominal:     General: Abdomen is flat. Bowel sounds are normal. There is no distension.     Palpations: Abdomen is soft.     Tenderness: There is abdominal tenderness. There is no guarding or rebound.     Comments: Mild tenderness in epigastric region. No rebound or guarding. No peritoneal signs.   Musculoskeletal:     Cervical back: Neck supple.     Comments: Able to move all 4 extremities without difficulty.   Skin:    General: Skin is warm and dry.  Neurological:     General: No focal deficit present.     Mental Status: She is alert.  Psychiatric:        Mood and Affect: Mood normal.        Behavior: Behavior normal.       ED Results / Procedures / Treatments   Labs (all labs ordered are listed, but only abnormal results are displayed) Labs Reviewed - No data to display  EKG None  Radiology No results found.  Procedures Procedures (including critical care time)  Medications Ordered in ED Medications  haloperidol lactate (HALDOL) injection 5 mg (5 mg Intravenous Given 05/31/19 1618)  ondansetron (ZOFRAN) injection 4 mg (4 mg Intravenous Given 05/31/19 1756)  pantoprazole (PROTONIX) injection 40 mg (40 mg Intravenous  Given 05/31/19 1756)    ED Course  I have reviewed the triage vital signs and the nursing notes.  Pertinent labs & imaging results that were available during my care of the patient were reviewed by me and considered in my medical decision making (see chart for details).    MDM Rules/Calculators/A&P                     24 year old female presents to the ED due to worsening abdominal pain, nausea, and vomiting.  Patient was just discharged from the hospital for the same complaint this morning around 5:30 AM.  She notes her symptoms improved after Haldol admission.  She admits to smoking marijuana after leaving the hospital this morning.  Patient in no acute distress and nontoxic-appearing.  Full labs obtained this morning and reviewed.  Lipase normal at 24.  Doubt pancreatitis.  Patient admits to occasional alcohol use.  CMP significant for hypokalemia at 3.1, but otherwise reassuring.  CBC significant for leukocytosis of 17.6.  3 weeks ago patient had similar leukocytosis.  UA negative for signs of infection.  Pregnancy test negative.  Personally reviewed which demonstrates normal sinus rhythm with a normal QTC.  Will give IV Haldol and reassess patient.  5:31 PM reassessed patient at bedside.  She admits mild improvement in symptoms; admits to some reflux symptoms.  Will give IV Protonix and IV Zofran and reassess.  7:39 PM reassessed patient and she notes she is ready to go home.  Able to tolerate po while here in the ED. Will discharge patient with zofran and phenergan. Advised patient to follow-up with PCP if symptoms do not improve within the next week. Educated patient on marijuana cessation. Strict ED precautions discussed with patient. Patient states understanding and agrees to plan. Patient discharged home in no acute distress and stable vitals   Final Clinical Impression(s) / ED Diagnoses Final diagnoses:  Epigastric pain  Nausea and vomiting, intractability of vomiting not specified, unspecified vomiting type    Rx / DC Orders ED Discharge Orders         Ordered    ondansetron (ZOFRAN ODT) 4 MG disintegrating tablet  Every 8 hours PRN     05/31/19 1940    promethazine (PHENERGAN) 25 MG tablet  Every 6 hours PRN     05/31/19 1940           Jesusita Oka 05/31/19 1943    Sabas Sous, MD 06/01/19 [phone]

## 2019-05-31 NOTE — Discharge Instructions (Signed)
As discussed, I am sending you home with nausea medication. Take as prescribed. Follow-up with PCP if symptoms do not improve within the next week. Return to the ER for new or worsening symptoms.

## 2019-06-01 ENCOUNTER — Emergency Department (HOSPITAL_COMMUNITY): Payer: 59

## 2019-06-01 ENCOUNTER — Other Ambulatory Visit: Payer: Self-pay

## 2019-06-01 ENCOUNTER — Emergency Department (HOSPITAL_COMMUNITY)
Admission: EM | Admit: 2019-06-01 | Discharge: 2019-06-01 | Disposition: A | Discharge status: Home / Self Care | Payer: 59 | Attending: Emergency Medicine | Admitting: Emergency Medicine

## 2019-06-01 ENCOUNTER — Encounter (HOSPITAL_COMMUNITY): Payer: Self-pay

## 2019-06-01 DIAGNOSIS — R112 Nausea with vomiting, unspecified: Secondary | ICD-10-CM | POA: Diagnosis not present

## 2019-06-01 DIAGNOSIS — R1013 Epigastric pain: Secondary | ICD-10-CM | POA: Diagnosis not present

## 2019-06-01 DIAGNOSIS — Z79899 Other long term (current) drug therapy: Secondary | ICD-10-CM | POA: Insufficient documentation

## 2019-06-01 LAB — CBC WITH DIFFERENTIAL/PLATELET
Abs Immature Granulocytes: 0.04 10*3/uL (ref 0.00–0.07)
Basophils Absolute: 0.1 10*3/uL (ref 0.0–0.1)
Basophils Relative: 0 %
Eosinophils Absolute: 0 10*3/uL (ref 0.0–0.5)
Eosinophils Relative: 0 %
HCT: 41.5 % (ref 36.0–46.0)
Hemoglobin: 13.5 g/dL (ref 12.0–15.0)
Immature Granulocytes: 0 %
Lymphocytes Relative: 12 %
Lymphs Abs: 1.5 10*3/uL (ref 0.7–4.0)
MCH: 28.4 pg (ref 26.0–34.0)
MCHC: 32.5 g/dL (ref 30.0–36.0)
MCV: 87.4 fL (ref 80.0–100.0)
Monocytes Absolute: 0.6 10*3/uL (ref 0.1–1.0)
Monocytes Relative: 5 %
Neutro Abs: 10.4 10*3/uL — ABNORMAL HIGH (ref 1.7–7.7)
Neutrophils Relative %: 83 %
Platelets: 289 10*3/uL (ref 150–400)
RBC: 4.75 MIL/uL (ref 3.87–5.11)
RDW: 12.2 % (ref 11.5–15.5)
WBC: 12.5 10*3/uL — ABNORMAL HIGH (ref 4.0–10.5)
nRBC: 0 % (ref 0.0–0.2)

## 2019-06-01 LAB — COMPREHENSIVE METABOLIC PANEL
ALT: 23 U/L (ref 0–44)
AST: 25 U/L (ref 15–41)
Albumin: 4.6 g/dL (ref 3.5–5.0)
Alkaline Phosphatase: 61 U/L (ref 38–126)
Anion gap: 14 (ref 5–15)
BUN: 12 mg/dL (ref 6–20)
CO2: 23 mmol/L (ref 22–32)
Calcium: 9.3 mg/dL (ref 8.9–10.3)
Chloride: 102 mmol/L (ref 98–111)
Creatinine, Ser: 0.88 mg/dL (ref 0.44–1.00)
GFR calc Af Amer: >60 mL/min (ref >60)
GFR calc non Af Amer: >60 mL/min (ref >60)
Glucose, Bld: 120 mg/dL — ABNORMAL HIGH (ref 70–99)
Potassium: 3.1 mmol/L — ABNORMAL LOW (ref 3.5–5.1)
Sodium: 139 mmol/L (ref 135–145)
Total Bilirubin: 0.8 mg/dL (ref 0.3–1.2)
Total Protein: 8 g/dL (ref 6.5–8.1)

## 2019-06-01 LAB — LIPASE, BLOOD: Lipase: 27 U/L (ref 11–51)

## 2019-06-01 LAB — I-STAT BETA HCG BLOOD, ED (MC, WL, AP ONLY): I-stat hCG, quantitative: <5 m[IU]/mL (ref <5)

## 2019-06-01 MED ORDER — SODIUM CHLORIDE 0.9 % IV BOLUS
1000.0000 mL | Freq: Once | INTRAVENOUS | Status: AC
  Administered 2019-06-01: 1000 mL via INTRAVENOUS

## 2019-06-01 MED ORDER — FAMOTIDINE IN NACL 20-0.9 MG/50ML-% IV SOLN
20.0000 mg | Freq: Once | INTRAVENOUS | Status: AC
  Administered 2019-06-01: 20 mg via INTRAVENOUS
  Filled 2019-06-01: qty 50

## 2019-06-01 MED ORDER — METOCLOPRAMIDE HCL 5 MG/ML IJ SOLN
10.0000 mg | Freq: Once | INTRAMUSCULAR | Status: AC
  Administered 2019-06-01: 10 mg via INTRAVENOUS
  Filled 2019-06-01: qty 2

## 2019-06-01 MED ORDER — HALOPERIDOL LACTATE 5 MG/ML IJ SOLN
5.0000 mg | Freq: Once | INTRAMUSCULAR | Status: AC
  Administered 2019-06-01: 5 mg via INTRAVENOUS
  Filled 2019-06-01: qty 1

## 2019-06-01 MED ORDER — ONDANSETRON HCL 4 MG/2ML IJ SOLN
4.0000 mg | Freq: Once | INTRAMUSCULAR | Status: AC
  Administered 2019-06-01: 4 mg via INTRAVENOUS
  Filled 2019-06-01: qty 2

## 2019-06-01 MED ORDER — KETOROLAC TROMETHAMINE 15 MG/ML IJ SOLN
15.0000 mg | Freq: Once | INTRAMUSCULAR | Status: AC
  Administered 2019-06-01: 15:00:00 15 mg via INTRAVENOUS
  Filled 2019-06-01: qty 1

## 2019-06-01 MED ORDER — LORAZEPAM 2 MG/ML IJ SOLN
1.0000 mg | Freq: Once | INTRAMUSCULAR | Status: AC
  Administered 2019-06-01: 1 mg via INTRAVENOUS
  Filled 2019-06-01: qty 1

## 2019-06-01 MED ORDER — HALOPERIDOL 2 MG PO TABS: 2.0000 mg | ORAL_TABLET | Freq: Two times a day (BID) | ORAL | 0 refills | Status: DC

## 2019-06-01 MED FILL — ONDANSETRON ODT 4 MG TABLET: 4 | 7 days supply | Qty: 20 | Fill #0

## 2019-06-01 MED FILL — PROMETHAZINE 25 MG TABLET: 25 | 8 days supply | Qty: 30 | Fill #0

## 2019-06-01 NOTE — ED Provider Notes (Signed)
[facility]-EMERGENCY DEPT Provider Note   CSN: [id number] Arrival date & time: 06/01/19  4287     History Chief Complaint  Patient presents with  . Abdominal Pain    Diana Thomas is a 24 y.o. female who presents with epigastric pain, nausea and vomiting.  She states that symptoms have been going on since Monday.  She has chronic abdominal issues but it has never been this bad before.  She has been seen in the emergency department 2 times yesterday.  Both times she had symptomatic relief after medicines but when she went home symptoms returned.  She is taking Protonix, Bentyl, Zofran, Phenergan without relief.  She does admit to smoking marijuana yesterday to try and help her anxiety.  She denies fever, chills, chest pain, shortness of breath, diarrhea, urinary symptoms.  HPI     Past Medical History:  Diagnosis Date  . Allergy   . Anemia   . Anxiety   . Depression   . Gastritis   . GERD (gastroesophageal reflux disease)   . Heart murmur     Patient Active Problem List   Diagnosis Date Noted  . Moderate episode of recurrent major depressive disorder (HCC) 04/29/2018  . Gastroesophageal reflux disease without esophagitis 04/29/2018  . Generalized anxiety disorder 09/16/2017    Past Surgical History:  Procedure Laterality Date  . NO PAST SURGERIES       OB History   No obstetric history on file.     Family History  Problem Relation Age of Onset  . Heart disease Mother   . Hypertension Mother   . Hyperlipidemia Mother   . ADD / ADHD Father   . Healthy Father   . Healthy Sister   . Liver disease Maternal Grandmother   . Diabetes Maternal Uncle   . Colon cancer Neg Hx   . Esophageal cancer Neg Hx   . Stomach cancer Neg Hx   . Rectal cancer Neg Hx     Social History   Tobacco Use  . Smoking status: Never Smoker  . Smokeless tobacco: Never Used  Substance Use Topics  . Alcohol use: Yes    Comment: occasional drinker  . Drug use:  Never    Home Medications Prior to Admission medications   Medication Sig Start Date End Date Taking? Authorizing Provider  busPIRone (BUSPAR) 15 MG tablet Take 1 tablet (15 mg total) by mouth at bedtime. 10/12/18   Myles Lipps, MD  dicyclomine (BENTYL) 20 MG tablet Take 1 tablet (20 mg total) by mouth every 8 (eight) hours as needed for spasms (Abdominal cramping). 05/31/19   Ward, Layla Maw, DO  escitalopram (LEXAPRO) 20 MG tablet Take 1 tablet (20 mg total) by mouth at bedtime. 10/12/18   Myles Lipps, MD  medroxyPROGESTERone (DEPO-PROVERA) 150 MG/ML injection Inject 1 mL (150 mg total) into the muscle every 3 (three) months. 01/30/19   Myles Lipps, MD  ondansetron (ZOFRAN ODT) 4 MG disintegrating tablet Take 1 tablet (4 mg total) by mouth every 8 (eight) hours as needed for nausea or vomiting. 05/31/19   Mannie Stabile, PA-C  ondansetron (ZOFRAN ODT) 8 MG disintegrating tablet Take 1 tablet (8 mg total) by mouth every 8 (eight) hours as needed for nausea or vomiting. 05/30/18   Myles Lipps, MD  pantoprazole (PROTONIX) 40 MG tablet Take 1-2 tablets (40-80 mg total) by mouth daily. Patient taking differently: Take 40 mg by mouth 2 (two) times daily.  10/12/18  Rutherford Guys, MD  promethazine (PHENERGAN) 12.5 MG tablet TAKE 1 TABLET (12.5 MG TOTAL) BY MOUTH EVERY 8 (EIGHT) HOURS AS NEEDED FOR NAUSEA OR VOMITING. 02/26/19   Rutherford Guys, MD  promethazine (PHENERGAN) 25 MG tablet Take 1 tablet (25 mg total) by mouth every 6 (six) hours as needed for nausea or vomiting. 05/31/19   Suzy Bouchard, PA-C    Allergies    Patient has no known allergies.  Review of Systems   Review of Systems  Constitutional: Negative for chills and fever.  Respiratory: Negative for shortness of breath.   Cardiovascular: Negative for chest pain.  Gastrointestinal: Positive for abdominal pain, nausea and vomiting. Negative for diarrhea.  Genitourinary: Negative for dysuria and flank  pain.  All other systems reviewed and are negative.   Physical Exam Updated Vital Signs BP (!) 151/103 (BP Location: Left Arm)   Pulse 65   Temp 98.1 F (36.7 C) (Oral)   Resp 16   Ht 5\' 5"  (1.651 m)   Wt 72.6 kg   SpO2 100%   BMI 26.63 kg/m   Physical Exam Vitals and nursing note reviewed.  Constitutional:      General: She is not in acute distress.    Appearance: She is well-developed.     Comments: Uncomfortable. Anxious  HENT:     Head: Normocephalic and atraumatic.  Eyes:     General: No scleral icterus.       Right eye: No discharge.        Left eye: No discharge.     Conjunctiva/sclera: Conjunctivae normal.     Pupils: Pupils are equal, round, and reactive to light.  Cardiovascular:     Rate and Rhythm: Normal rate.  Pulmonary:     Effort: Pulmonary effort is normal. No respiratory distress.  Abdominal:     General: Abdomen is flat. Bowel sounds are normal. There is no distension.     Palpations: Abdomen is soft.     Tenderness: There is abdominal tenderness in the epigastric area. There is guarding (voluntary - pushing my hand away).  Musculoskeletal:     Cervical back: Normal range of motion.  Skin:    General: Skin is warm and dry.  Neurological:     Mental Status: She is alert and oriented to person, place, and time.  Psychiatric:        Behavior: Behavior normal.     ED Results / Procedures / Treatments   Labs (all labs ordered are listed, but only abnormal results are displayed) Labs Reviewed  CBC WITH DIFFERENTIAL/PLATELET - Abnormal; Notable for the following components:      Result Value   WBC 12.5 (*)    Neutro Abs 10.4 (*)    All other components within normal limits  COMPREHENSIVE METABOLIC PANEL - Abnormal; Notable for the following components:   Potassium 3.1 (*)    Glucose, Bld 120 (*)    All other components within normal limits  LIPASE, BLOOD  I-STAT BETA HCG BLOOD, ED ([facility], [facility], [facility] ONLY)    EKG None  Radiology US Abdomen  Limited RUQ  Result Date: 06/01/2019 CLINICAL DATA:  24 year old female with 2 days of epigastric pain. EXAM: ULTRASOUND ABDOMEN LIMITED RIGHT UPPER QUADRANT COMPARISON:  CT Abdomen and Pelvis 01/10/2018. Right upper quadrant ultrasound 10/03/2017. FINDINGS: Gallbladder: No gallstones or wall thickening visualized. No sonographic Murphy sign noted by sonographer. Common bile duct: Diameter: 3 mm, normal Liver: No focal lesion identified. Within normal limits in parenchymal  echogenicity. Portal vein is patent on color Doppler imaging with normal direction of blood flow towards the liver. Other: Normal visible right kidney. IMPRESSION: Normal right upper quadrant ultrasound. Electronically Signed   By: Odessa Fleming M.D.   On: 06/01/2019 14:21    Procedures Procedures (including critical care time)  Medications Ordered in ED Medications  LORazepam (ATIVAN) injection 1 mg (1 mg Intravenous Given 06/01/19 1301)  sodium chloride 0.9 % bolus 1,000 mL (1,000 mLs Intravenous New Bag/Given 06/01/19 1309)  ondansetron (ZOFRAN) injection 4 mg (4 mg Intravenous Given 06/01/19 1301)  famotidine (PEPCID) IVPB 20 mg premix (0 mg Intravenous Stopped 06/01/19 1343)  haloperidol lactate (HALDOL) injection 5 mg (5 mg Intravenous Given 06/01/19 1520)  metoCLOPramide (REGLAN) injection 10 mg (10 mg Intravenous Given 06/01/19 1520)  ketorolac (TORADOL) 15 MG/ML injection 15 mg (15 mg Intravenous Given 06/01/19 1520)    ED Course  I have reviewed the triage vital signs and the nursing notes.  Pertinent labs & imaging results that were available during my care of the patient were reviewed by me and considered in my medical decision making (see chart for details).  24 year old female presents with intractable nausea and vomiting with epigastric abdominal pain.  Patient has been having issues with chronic abdominal pain and nausea and vomiting for the past year.  This is her third ED visit in the past day.  She has tried multiple  medications as an outpatient without relief.  Heart is regular rate and rhythm.  Lungs are clear to auscultation.  Abdomen is soft and she has voluntary guarding in the upper abdomen.  We will do fluids, Ativan, Zofran, Pepcid.  Will order right upper quadrant ultrasound since this is her third visit for upper abdominal pain  Patient had no relief with medications.  We will try Haldol, Reglan, Toradol.  Ultrasound is negative.  Labs are reassuring.  Leukocytosis is resolving.  She still has mild hypokalemia.  Patient has no urinary symptoms so UA was canceled.  6:18 PM Rechecked pt. She feels better and has tolerated apple juice. She states she feels apprehensive because she has already gone home twice and symptoms have returned. She has been having more difficulty tolerating solids than liquids. She is requesting apple sauce which was given.  Patient tolerated the applesauce.  Since she has been getting the most relief with a Haldol so will send her with a low-dose of this for the next couple of days.  She is advised to abstain from any marijuana use.   MDM Rules/Calculators/A&P                       Final Clinical Impression(s) / ED Diagnoses Final diagnoses:  Epigastric pain  Nausea and vomiting, intractability of vomiting not specified, unspecified vomiting type    Rx / DC Orders ED Discharge Orders    None       Bethel Born, PA-C 06/01/19 1852    Tegeler, Canary Brim, MD 06/02/19 [phone]

## 2019-06-01 NOTE — ED Notes (Signed)
Pt did well with PO challenge, was able to keep down.

## 2019-06-01 NOTE — ED Triage Notes (Signed)
Patient c/o upper abdominal pain . Patient states she was seen twice yesterday for the same/ Patient reports a history of gastritis. Patient states she had temporary relief,but symptoms continue.

## 2019-06-01 NOTE — Discharge Instructions (Addendum)
Take Haldol as needed for nausea up to two times daily Your blood work and ultrasound looked normal today Please return if you are worsening

## 2019-06-01 NOTE — ED Notes (Signed)
PO fluids given to patient.

## 2019-06-08 ENCOUNTER — Encounter: Payer: Self-pay | Admitting: Family Medicine

## 2019-06-29 ENCOUNTER — Ambulatory Visit: Payer: 59 | Admitting: Family Medicine

## 2019-07-07 ENCOUNTER — Ambulatory Visit: Payer: 59 | Attending: Internal Medicine

## 2019-07-07 DIAGNOSIS — Z23 Encounter for immunization: Secondary | ICD-10-CM

## 2019-07-07 NOTE — Progress Notes (Signed)
   Covid-19 Vaccination Clinic  Name:  ADELENE POLIVKA    MRN: [id number] DOB: May 01, 1995  07/07/2019  Ms. Mellette was observed post Covid-19 immunization for 15 minutes without incident. She was provided with Vaccine Information Sheet and instruction to access the V-Safe system.   Ms. Seminara was instructed to call 911 with any severe reactions post vaccine: Marland Kitchen Difficulty breathing  . Swelling of face and throat  . A fast heartbeat  . A bad rash all over body  . Dizziness and weakness   Immunizations Administered    Name Date Dose VIS Date Route   Pfizer COVID-19 Vaccine 07/07/2019  9:46 AM 0.3 mL 04/11/2018 Intramuscular   Manufacturer: [employer], [employer]   Lot: [id number]   NDC: [address]-6

## 2019-07-12 ENCOUNTER — Ambulatory Visit: Payer: 59 | Admitting: Family Medicine

## 2019-07-27 ENCOUNTER — Ambulatory Visit: Payer: 59 | Admitting: Family Medicine

## 2019-07-30 ENCOUNTER — Ambulatory Visit: Payer: 59

## 2019-07-30 ENCOUNTER — Encounter: Payer: Self-pay | Admitting: Family Medicine

## 2019-08-09 DIAGNOSIS — Z20822 Contact with and (suspected) exposure to covid-19: Secondary | ICD-10-CM | POA: Diagnosis not present

## 2019-08-09 DIAGNOSIS — Z03818 Encounter for observation for suspected exposure to other biological agents ruled out: Secondary | ICD-10-CM | POA: Diagnosis not present

## 2019-08-13 ENCOUNTER — Ambulatory Visit: Payer: Self-pay | Admitting: *Deleted

## 2019-08-13 NOTE — Telephone Encounter (Signed)
  Reason for Disposition . [1] SEVERE vomiting (e.g., 6 or more times/day) AND [2] present > 8 hours (Exception: patient sounds well, is drinking liquids, does not sound dehydrated, and vomiting has lasted less than 24 hours)  Answer Assessment - Initial Assessment Questions 1. VOMITING SEVERITY: "How many times have you vomited in the past 24 hours?"     - MILD:  1 - 2 times/day    - MODERATE: 3 - 5 times/day, decreased oral intake without significant weight loss or symptoms of dehydration    - SEVERE: 6 or more times/day, vomits everything or nearly everything, with significant weight loss, symptoms of dehydration      Moderate-vomiting several times during the day and again at night.  2. ONSET: "When did the vomiting begin?"      Friday afternoon and has continued. 3. FLUIDS: "What fluids or food have you vomited up today?" "Have you been able to keep any fluids down?"     Has not been able to keep down fluids. No food taken in since the start.  4. ABDOMINAL PAIN: "Are your having any abdominal pain?" If yes : "How bad is it and what does it feel like?" (e.g., crampy, dull, intermittent, constant)      intermittent abdominal cramping  5. DIARRHEA: "Is there any diarrhea?" If Yes, ask: "How many times today?"      minimal 6. CONTACTS: "Is there anyone else in the family with the same symptoms?"      no 7. CAUSE: "What do you think is causing your vomiting?"     Unsure. All started after one alcoholic drink Friday afternoon in [place] 8. HYDRATION STATUS: "Any signs of dehydration?" (e.g., dry mouth [not only dry lips], too weak to stand) "When did you last urinate?"    Yes, dry mouth and strong odor and concentrated urine today. 9. OTHER SYMPTOMS: "Do you have any other symptoms?" (e.g., fever, headache, vertigo, vomiting blood or coffee grounds, recent head injury)     None of these reported. 10. PREGNANCY: "Is there any chance you are pregnant?" "When was your last menstrual  period?"       Not asked.  Protocols used: [facility]

## 2019-08-28 ENCOUNTER — Ambulatory Visit: Payer: 59 | Admitting: Family Medicine

## 2019-08-28 ENCOUNTER — Encounter: Payer: Self-pay | Admitting: Family Medicine

## 2019-08-28 ENCOUNTER — Other Ambulatory Visit: Payer: Self-pay

## 2019-08-28 ENCOUNTER — Other Ambulatory Visit: Payer: Self-pay | Admitting: Family Medicine

## 2019-08-28 VITALS — BP 115/68 | HR 86 | Temp 97.9°F | Ht 65.0 in | Wt 152.0 lb

## 2019-08-28 DIAGNOSIS — R112 Nausea with vomiting, unspecified: Secondary | ICD-10-CM

## 2019-08-28 DIAGNOSIS — Z3042 Encounter for surveillance of injectable contraceptive: Secondary | ICD-10-CM

## 2019-08-28 DIAGNOSIS — F39 Unspecified mood [affective] disorder: Secondary | ICD-10-CM | POA: Diagnosis not present

## 2019-08-28 LAB — POCT URINE PREGNANCY: Preg Test, Ur: NEGATIVE

## 2019-08-28 MED ORDER — QUETIAPINE FUMARATE 100 MG PO TABS: 100.0000 mg | ORAL_TABLET | Freq: Every day | ORAL | 1 refills | Status: DC

## 2019-08-28 MED ORDER — ESCITALOPRAM OXALATE 20 MG PO TABS: 20.0000 mg | ORAL_TABLET | Freq: Every day | ORAL | 1 refills | Status: DC

## 2019-08-28 MED ORDER — BUSPIRONE HCL 15 MG PO TABS: 15.0000 mg | ORAL_TABLET | Freq: Every day | ORAL | 3 refills | Status: DC

## 2019-08-28 MED ORDER — ONDANSETRON 8 MG PO TBDP: 8.0000 mg | ORAL_TABLET | Freq: Three times a day (TID) | ORAL | 3 refills | Status: DC | PRN

## 2019-08-28 MED ORDER — PROMETHAZINE HCL 25 MG PO TABS: 25.0000 mg | ORAL_TABLET | Freq: Four times a day (QID) | ORAL | 0 refills | Status: DC | PRN

## 2019-08-28 MED FILL — ESCITALOPRAM 20 MG TABLET: 20 | 90 days supply | Qty: 90 | Fill #0

## 2019-08-28 MED FILL — PROMETHAZINE 25 MG TABLET: 25 | 7 days supply | Qty: 30 | Fill #0

## 2019-08-28 MED FILL — ONDANSETRON ODT 8 MG TABLET: 8 | 6 days supply | Qty: 20 | Fill #0

## 2019-08-28 MED FILL — QUETIAPINE FUMARATE 100 MG: 100 | 30 days supply | Qty: 30 | Fill #0

## 2019-08-28 MED FILL — busPIRone HCL 15 MG TABS: 15 | 90 days supply | Qty: 90 | Fill #0

## 2019-08-28 NOTE — Patient Instructions (Addendum)
Start seroquel 50mg  (1/2 tablet) at bedtime for 2 weeks, if able to increase to 100mg  (1 tablet) at bedtime    If you have lab work done today you will be contacted with your lab results within the next 2 weeks.  If you have not heard from then please contact . The fastest way to get your results is to register for My Chart.   IF you received an x-ray today, you will receive an invoice from [facility]. Please contact [facility] at [phone] with questions or concerns regarding your invoice.   IF you received labwork today, you will receive an invoice from [facility]. Please contact [facility] at [phone] with questions or concerns regarding your invoice.   Our billing staff will not be able to assist you with questions regarding bills from these companies.  You will be contacted with the lab results as soon as they are available. The fastest way to get your results is to activate your My Chart account. Instructions are located on the last page of this paperwork. If you have not heard from [facility] regarding the results in 2 weeks, please contact this office.

## 2019-08-28 NOTE — Progress Notes (Signed)
7/13/20213:59 PM  Diana Thomas 1995/02/16, 24 y.o., female [id number]  Chief Complaint  Patient presents with  . ER follow up Nausea  . Medication Refill  . Anxiety    request for new medication    HPI:   Patient is a 24 y.o. female with past medical history significant for GERD and anxiety who presents here for ER followup  Last OV nov 2020 Patient seen 5 times in ER: 3/21, 4/14, 4/15 x2, 4/16 neg RUQ [place] Overall her stomach is doing ok Uses zofran, phenegran prn, needs refills Smoking THC sporadically, last use about a week ago  Patient would like to restart depo Last injection jan 2021  She is not doing well, up and down mood wise Taking on lots of tasks, gets overwhelmed Feels like she has all these great ideas but then gets really hopeless, laying in bed for days, did not shower for 3 days phq9 and gad 7 noted MDQ9 positive (q1 - 11 yes, q2 - yes, q3 - mod)  GAD 7 : Generalized Anxiety Score 08/28/2019 04/29/2018 04/29/2018  Nervous, Anxious, on Edge 3 3 3   Control/stop worrying 3 3 3   Worry too much - different things 3 3 3   Trouble relaxing 2 3 3   Restless 2 1 1   Easily annoyed or irritable 2 3 3   Afraid - awful might happen 0 0 0  Total GAD 7 Score 15 16 16   Anxiety Difficulty Very difficult Very difficult Very difficult     Depression screen [facility] 2/9 08/28/2019 12/22/2018 10/12/2018  Decreased Interest 0 0 0  Down, Depressed, Hopeless 0 0 0  PHQ - 2 Score 0 0 0  Altered sleeping - - -  Tired, decreased energy - - -  Change in appetite - - -  Feeling bad or failure about yourself  - - -  Trouble concentrating - - -  Moving slowly or fidgety/restless - - -  Suicidal thoughts - - -  PHQ-9 Score - - -  Difficult doing work/chores - - -    Fall Risk  08/28/2019 12/22/2018 10/12/2018 08/29/2018 04/29/2018  Falls in the past year? 0 0 0 0 0  Number falls in past yr: 0 0 0 0 -  Injury with Fall? 0 0 - 0 0  Follow up Falls evaluation completed Falls  evaluation completed - Falls evaluation completed -     No Known Allergies  Prior to Admission medications   Medication Sig Start Date End Date Taking? Authorizing Provider  busPIRone (BUSPAR) 15 MG tablet Take 1 tablet (15 mg total) by mouth at bedtime. 10/12/18  Yes 10/14/2018, MD  dicyclomine (BENTYL) 20 MG tablet Take 1 tablet (20 mg total) by mouth every 8 (eight) hours as needed for spasms (Abdominal cramping). 05/31/19  Yes Ward, Kristen N, DO  escitalopram (LEXAPRO) 20 MG tablet Take 1 tablet (20 mg total) by mouth at bedtime. 10/12/18  Yes 10/14/2018, MD  medroxyPROGESTERone (DEPO-PROVERA) 150 MG/ML injection Inject 1 mL (150 mg total) into the muscle every 3 (three) months. 01/30/19  Yes 05/01/2018, MD  ondansetron (ZOFRAN ODT) 8 MG disintegrating tablet Take 1 tablet (8 mg total) by mouth every 8 (eight) hours as needed for nausea or vomiting. 05/30/18  Yes Myles Lipps, MD  pantoprazole (PROTONIX) 40 MG tablet Take 1-2 tablets (40-80 mg total) by mouth daily. Patient taking differently: Take 40 mg by mouth 2 (two) times daily.  10/12/18  Yes 10/14/18, Myles Lipps  M, MD  promethazine (PHENERGAN) 12.5 MG tablet TAKE 1 TABLET (12.5 MG TOTAL) BY MOUTH EVERY 8 (EIGHT) HOURS AS NEEDED FOR NAUSEA OR VOMITING. 02/26/19  Yes Myles Lipps, MD  promethazine (PHENERGAN) 25 MG tablet Take 1 tablet (25 mg total) by mouth every 6 (six) hours as needed for nausea or vomiting. 05/31/19  Yes Jesusita Oka    Past Medical History:  Diagnosis Date  . Allergy   . Anemia   . Anxiety   . Depression   . Gastritis   . GERD (gastroesophageal reflux disease)   . Heart murmur     Past Surgical History:  Procedure Laterality Date  . NO PAST SURGERIES      Social History   Tobacco Use  . Smoking status: Never Smoker  . Smokeless tobacco: Never Used  Substance Use Topics  . Alcohol use: Yes    Comment: occasional drinker    Family History  Problem Relation Age of  Onset  . Heart disease Mother   . Hypertension Mother   . Hyperlipidemia Mother   . ADD / ADHD Father   . Healthy Father   . Healthy Sister   . Liver disease Maternal Grandmother   . Diabetes Maternal Uncle   . Colon cancer Neg Hx   . Esophageal cancer Neg Hx   . Stomach cancer Neg Hx   . Rectal cancer Neg Hx     ROS Per hpi  OBJECTIVE:  Today's Vitals   08/28/19 1551  BP: 115/68  Pulse: 86  Temp: 97.9 F (36.6 C)  SpO2: 96%  Weight: 152 lb (68.9 kg)  Height: 5\' 5"  (1.651 m)   Body mass index is 25.29 kg/m.   Physical Exam Vitals and nursing note reviewed.  Constitutional:      Appearance: She is well-developed.  HENT:     Head: Normocephalic and atraumatic.  Eyes:     General: No scleral icterus.    Conjunctiva/sclera: Conjunctivae normal.     Pupils: Pupils are equal, round, and reactive to light.  Pulmonary:     Effort: Pulmonary effort is normal.  Musculoskeletal:     Cervical back: Neck supple.  Skin:    General: Skin is warm and dry.  Neurological:     Mental Status: She is alert and oriented to person, place, and time.  Psychiatric:        Mood and Affect: Mood is depressed. Affect is tearful.        Speech: Speech is not rapid and pressured.     Results for orders placed or performed in visit on 08/28/19 (from the past 24 hour(s))  POCT urine pregnancy     Status: Normal   Collection Time: 08/28/19  3:55 PM  Result Value Ref Range   Preg Test, Ur Negative Negative    No results found.   ASSESSMENT and PLAN  1. Mood disorder (HCC) History concerning for mood disorder, mdq9 positive. Discussed referral to psych for evaluation and treatment. Will start seroquel as mood stabilizer and will help with anxiety. Reviewed titration and r/se/b. Cont with lexapro and buspar for now. Will defer to psych for further medication adjustments. Also referring for counseling as requested.  - Ambulatory referral to Psychiatry - Ambulatory referral to  Psychology  2. Nausea and vomiting, intractability of vomiting not specified, unspecified vomiting type - ondansetron (ZOFRAN ODT) 8 MG disintegrating tablet; Take 1 tablet (8 mg total) by mouth every 8 (eight) hours as needed for nausea  or vomiting.  3. Encounter for surveillance of injectable contraceptive - POCT urine pregnancy - negative Return in 3 months for depo injection, nurse visit ok  Other orders - busPIRone (BUSPAR) 15 MG tablet; Take 1 tablet (15 mg total) by mouth at bedtime. - promethazine (PHENERGAN) 25 MG tablet; Take 1 tablet (25 mg total) by mouth every 6 (six) hours as needed for nausea or vomiting. - escitalopram (LEXAPRO) 20 MG tablet; Take 1 tablet (20 mg total) by mouth at bedtime. - QUEtiapine (SEROQUEL) 100 MG tablet; Take 1 tablet (100 mg total) by mouth at bedtime.  Return in about 4 weeks (around 09/25/2019).    Myles Lipps, MD Primary Care at [facility] [address] [place], [place] [address] Ph.  [phone] Fax [phone]

## 2019-08-29 ENCOUNTER — Telehealth (INDEPENDENT_AMBULATORY_CARE_PROVIDER_SITE_OTHER): Payer: 59 | Admitting: Licensed Clinical Social Worker

## 2019-08-29 DIAGNOSIS — F39 Unspecified mood [affective] disorder: Secondary | ICD-10-CM

## 2019-08-29 NOTE — BH Specialist Note (Signed)
[facility] Initial Clinical Assessment  MRN: [id number] NAME: Diana Thomas Date: 08/29/19  Start time:  230p End time:  255p Total time:  Call number:  [phone]  Type of Contact:  telephone Patient consent obtained:  yes Reason for Visit today:  begin [facility] services  Treatment History Patient recently received Inpatient Treatment:  denies  Facility/Program:    Date of discharge:   Patient currently being seen by therapist/psychiatrist:   Patient currently receiving the following services:    Past Psychiatric History/Hospitalization(s): Anxiety: No Bipolar Disorder: No Depression: No Mania: No Psychosis: No Schizophrenia: No Personality Disorder: No Hospitalization for psychiatric illness: No History of Electroconvulsive Shock Therapy: No Prior Suicide Attempts: No  Clinical Assessment:  PHQ-9 Assessments: Depression screen [facility] 2/9 08/28/2019 12/22/2018 10/12/2018  Decreased Interest 0 0 0  Down, Depressed, Hopeless 0 0 0  PHQ - 2 Score 0 0 0  Altered sleeping - - -  Tired, decreased energy - - -  Change in appetite - - -  Feeling bad or failure about yourself  - - -  Trouble concentrating - - -  Moving slowly or fidgety/restless - - -  Suicidal thoughts - - -  PHQ-9 Score - - -  Difficult doing work/chores - - -    GAD-7 Assessments: GAD 7 : Generalized Anxiety Score 08/28/2019 04/29/2018 04/29/2018  Nervous, Anxious, on Edge 3 3 3   Control/stop worrying 3 3 3   Worry too much - different things 3 3 3   Trouble relaxing 2 3 3   Restless 2 1 1   Easily annoyed or irritable 2 3 3   Afraid - awful might happen 0 0 0  Total GAD 7 Score 15 16 16   Anxiety Difficulty Very difficult Very difficult Very difficult     Social Functioning Social maturity:  WNL Social judgement:  WNL  Stress Current stressors:  work Familial stressors:  denies Sleep:  oversleeps Appetite:  poor appetite Coping ability:  overwhelmed Patient taking medications as  prescribed:  reports that she takes medication daily  Current medications:  Outpatient Encounter Medications as of 08/29/2019  Medication Sig  . busPIRone (BUSPAR) 15 MG tablet Take 1 tablet (15 mg total) by mouth at bedtime.  . dicyclomine (BENTYL) 20 MG tablet Take 1 tablet (20 mg total) by mouth every 8 (eight) hours as needed for spasms (Abdominal cramping).  escitalopram (LEXAPRO) 20 MG tablet Take 1 tablet (20 mg total) by mouth at bedtime.  . medroxyPROGESTERone (DEPO-PROVERA) 150 MG/ML injection Inject 1 mL (150 mg total) into the muscle every 3 (three) months.  . ondansetron (ZOFRAN ODT) 8 MG disintegrating tablet Take 1 tablet (8 mg total) by mouth every 8 (eight) hours as needed for nausea or vomiting.  . pantoprazole (PROTONIX) 40 MG tablet Take 1-2 tablets (40-80 mg total) by mouth daily. (Patient taking differently: Take 40 mg by mouth 2 (two) times daily. )  . promethazine (PHENERGAN) 12.5 MG tablet TAKE 1 TABLET (12.5 MG TOTAL) BY MOUTH EVERY 8 (EIGHT) HOURS AS NEEDED FOR NAUSEA OR VOMITING.  . promethazine (PHENERGAN) 25 MG tablet Take 1 tablet (25 mg total) by mouth every 6 (six) hours as needed for nausea or vomiting.  QUEtiapine (SEROQUEL) 100 MG tablet Take 1 tablet (100 mg total) by mouth at bedtime.   No facility-administered encounter medications on file as of 08/29/2019.    Self-harm Behaviors Risk Assessment Self-harm risk factors:  denies Patient endorses recent thoughts of harming self:  [place] Suicide Severity Rating Scale:  C-SRSS 05/24/18  1. Wish to be Dead No  2. Suicidal Thoughts Yes  3. Suicidal Thoughts with Method Without Specific Plan or Intent to Act No  4. Suicidal Intent Without Specific Plan No  5. Suicide Intent with Specific Plan No  6. Suicide Behavior Question No  7. How long ago did you do any of these? Within the last three months    Danger to Others Risk Assessment Danger to others risk factors:  denies Patient endorses recent  thoughts of harming others:    Dynamic Appraisal of Situational Aggression (DASA): No flowsheet data found.  Substance Use Assessment Patient recently consumed alcohol:  denies  Alcohol Use Disorder Identification Test (AUDIT): No flowsheet data found. Patient recently used drugs:    Opioid Risk Assessment:  Patient is concerned about dependence or abuse of substances:    ASAM Multidimensional Assessment Summary:  Dimension 1:    Dimension 1 Rating:    Dimension 2:    Dimension 2 Rating:    Dimension 3:    Dimension 3 Rating:    Dimension 4:    Dimension 4 Rating:    Dimension 5:    Dimension 5 Rating:    Dimension 6:    Dimension 6 Rating:   ASAM's Severity Rating Score:   ASAM Recommended Level of Treatment:     Goals, Interventions and Follow-up Plan Goals: Increase healthy adjustment to current life circumstances Interventions: Brief CBT Follow-up Plan: weekly VBH (wednesday) sessions  Summary of Clinical Assessment Summary: Diana Thomas is a 24 year old woman that was referred to [facility] services by her PCP (Dr. Leretha Pol).  Diana Thomas reports symptoms as: talking to herself, poor sleep patterns (but sleeping too much), poor appetite (only eating a kids meal, grapes and a waffle), overwhelmed, difficulty concentrating, lack of interest). She works multiple jobs and at times stretches herself to thin. Denies any hobbies or social outings with friends. She has a good relationship with her boyfriend. Discussion of relaxation techniques.  Explored coping skills and encouraged her to use relaxation techniques daily.   Marinda Elk, LCSW

## 2019-08-30 ENCOUNTER — Ambulatory Visit: Payer: 59 | Admitting: Family Medicine

## 2019-09-19 ENCOUNTER — Ambulatory Visit: Payer: 59 | Admitting: Psychologist

## 2019-09-25 ENCOUNTER — Telehealth (INDEPENDENT_AMBULATORY_CARE_PROVIDER_SITE_OTHER): Payer: 59 | Admitting: Licensed Clinical Social Worker

## 2019-09-25 ENCOUNTER — Ambulatory Visit: Payer: 59 | Admitting: Family Medicine

## 2019-09-25 DIAGNOSIS — F39 Unspecified mood [affective] disorder: Secondary | ICD-10-CM

## 2019-09-25 DIAGNOSIS — F411 Generalized anxiety disorder: Secondary | ICD-10-CM

## 2019-09-25 DIAGNOSIS — F331 Major depressive disorder, recurrent, moderate: Secondary | ICD-10-CM

## 2019-09-25 NOTE — BH Specialist Note (Signed)
Virtual Behavioral Health Treatment Plan Team Note  MRN: [id number] NAME: Diana Thomas  DATE: 09/25/19  Start time:  1010a End time:  1020a Total time:   Total number of Virtual BH Treatment Team Plan encounters: 2/4  Treatment Team Attendees: Dr. Daleen Bo, Psychiatrist; Nolon Rod, LCSW  Diagnoses:    ICD-10-CM   1. Generalized anxiety disorder  F41.1   2. Mood disorder (HCC)  F39   3. Moderate episode of recurrent major depressive disorder (HCC)  F33.1     Goals, Interventions and Follow-up Plan Goals: Increase healthy adjustment to current life circumstances Interventions: Brief CBT Medication Management Recommendations: face to face Psychiatric Evaluation Follow-up Plan: weekly VBH (wednesday) sessions  History of the present illness Presenting Problem/Current Symptoms:   Psychiatric History  Depression: Yes Anxiety: Yes Mania: No Psychosis: No PTSD symptoms: No  Past Psychiatric History/Hospitalization(s): Hospitalization for psychiatric illness: No Prior Suicide Attempts: No Prior Self-injurious behavior: No  Psychosocial stressors overwhelmed at work Poor sleep hygiene  Self-harm Behaviors Risk Assessment n/a   Screenings PHQ-9 Assessments:  Depression screen [facility] 2/9 08/28/2019 12/22/2018 10/12/2018  Decreased Interest 0 0 0  Down, Depressed, Hopeless 0 0 0  PHQ - 2 Score 0 0 0  Altered sleeping - - -  Tired, decreased energy - - -  Change in appetite - - -  Feeling bad or failure about yourself  - - -  Trouble concentrating - - -  Moving slowly or fidgety/restless - - -  Suicidal thoughts - - -  PHQ-9 Score - - -  Difficult doing work/chores - - -   GAD-7 Assessments:  GAD 7 : Generalized Anxiety Score 08/28/2019 04/29/2018 04/29/2018  Nervous, Anxious, on Edge 3 3 3   Control/stop worrying 3 3 3   Worry too much - different things 3 3 3   Trouble relaxing 2 3 3   Restless 2 1 1   Easily annoyed or irritable 2 3 3   Afraid - awful might  happen 0 0 0  Total GAD 7 Score 15 16 16   Anxiety Difficulty Very difficult Very difficult Very difficult    Past Medical History Past Medical History:  Diagnosis Date   Allergy    Anemia    Anxiety    Depression    Gastritis    GERD (gastroesophageal reflux disease)    Heart murmur     Vital signs: There were no vitals filed for this visit.  Allergies:  Allergies as of 09/25/2019   (No Known Allergies)    Medication History Current medications:  Outpatient Encounter Medications as of 09/25/2019  Medication Sig   busPIRone (BUSPAR) 15 MG tablet Take 1 tablet (15 mg total) by mouth at bedtime.   dicyclomine (BENTYL) 20 MG tablet Take 1 tablet (20 mg total) by mouth every 8 (eight) hours as needed for spasms (Abdominal cramping).   escitalopram (LEXAPRO) 20 MG tablet Take 1 tablet (20 mg total) by mouth at bedtime.   medroxyPROGESTERone (DEPO-PROVERA) 150 MG/ML injection Inject 1 mL (150 mg total) into the muscle every 3 (three) months.   ondansetron (ZOFRAN ODT) 8 MG disintegrating tablet Take 1 tablet (8 mg total) by mouth every 8 (eight) hours as needed for nausea or vomiting.   pantoprazole (PROTONIX) 40 MG tablet Take 1-2 tablets (40-80 mg total) by mouth daily. (Patient taking differently: Take 40 mg by mouth 2 (two) times daily. )   promethazine (PHENERGAN) 12.5 MG tablet TAKE 1 TABLET (12.5 MG TOTAL) BY MOUTH EVERY 8 (EIGHT) HOURS AS  NEEDED FOR NAUSEA OR VOMITING.   promethazine (PHENERGAN) 25 MG tablet Take 1 tablet (25 mg total) by mouth every 6 (six) hours as needed for nausea or vomiting.   QUEtiapine (SEROQUEL) 100 MG tablet Take 1 tablet (100 mg total) by mouth at bedtime.   No facility-administered encounter medications on file as of 09/25/2019.     Scribe for Treatment Team: Marinda Elk, LCSW

## 2019-10-01 ENCOUNTER — Telehealth: Payer: Self-pay | Admitting: Licensed Clinical Social Worker

## 2019-10-01 DIAGNOSIS — F39 Unspecified mood [affective] disorder: Secondary | ICD-10-CM

## 2019-10-01 DIAGNOSIS — F331 Major depressive disorder, recurrent, moderate: Secondary | ICD-10-CM

## 2019-10-01 DIAGNOSIS — F411 Generalized anxiety disorder: Secondary | ICD-10-CM

## 2019-10-01 NOTE — Telephone Encounter (Signed)
Left message encouraging contact 

## 2019-10-05 ENCOUNTER — Ambulatory Visit: Payer: 59 | Admitting: Family Medicine

## 2019-10-08 ENCOUNTER — Encounter: Payer: Self-pay | Admitting: Family Medicine

## 2019-10-16 MED FILL — QUETIAPINE FUMARATE 100 MG: 100 | 30 days supply | Qty: 30 | Fill #1

## 2019-10-16 MED FILL — ONDANSETRON ODT 8 MG TABLET: 8 | 6 days supply | Qty: 20 | Fill #1

## 2019-10-24 ENCOUNTER — Telehealth: Payer: Self-pay | Admitting: Licensed Clinical Social Worker

## 2019-10-24 NOTE — Telephone Encounter (Signed)
Left message encouraging contact 

## 2019-11-26 ENCOUNTER — Other Ambulatory Visit: Payer: Self-pay | Admitting: Family Medicine

## 2019-11-26 MED FILL — QUETIAPINE FUMARATE 100 MG: 100 | 30 days supply | Qty: 30 | Fill #0

## 2019-11-26 NOTE — Telephone Encounter (Signed)
Requested medication (s) are due for refill today:yes  Requested medication (s) are on the active medication list: yes   Last refill: 08/28/19  #30  1 refill  Future visit scheduled no  Notes to clinic: not delegated  Requested Prescriptions  Pending Prescriptions Disp Refills   QUEtiapine (SEROQUEL) 100 MG tablet [Pharmacy Med Name: QUETIAPINE FUMARATE 100 MG 100 Tablet] 30 tablet 1    Sig: TAKE 1 TABLET BY MOUTH AT BEDTIME      Not Delegated - Psychiatry:  Antipsychotics - Second Generation (Atypical) - quetiapine Failed - 11/26/2019  3:46 PM      Failed - This refill cannot be delegated      Passed - ALT in normal range and within 180 days    ALT  Date Value Ref Range Status  06/01/2019 23 0 - 44 U/L Final          Passed - AST in normal range and within 180 days    AST  Date Value Ref Range Status  06/01/2019 25 15 - 41 U/L Final          Passed - Completed PHQ-2 or PHQ-9 in the last 360 days.      Passed - Last BP in normal range    BP Readings from Last 1 Encounters:  08/28/19 115/68          Passed - Valid encounter within last 6 months    Recent Outpatient Visits           3 months ago Mood disorder [facility])   [facility], Meda Coffee, MD   9 months ago Encounter for surveillance of injectable contraceptive   [facility], Meda Coffee, MD   10 months ago Encounter for surveillance of injectable contraceptive   [facility], Meda Coffee, MD   11 months ago Gastroesophageal reflux disease without esophagitis   [facility], Meda Coffee, MD   1 year ago Gastroesophageal reflux disease without esophagitis   [facility], Meda Coffee, MD

## 2019-12-02 ENCOUNTER — Encounter (HOSPITAL_COMMUNITY): Payer: Self-pay | Admitting: Emergency Medicine

## 2019-12-02 ENCOUNTER — Other Ambulatory Visit: Payer: Self-pay

## 2019-12-02 ENCOUNTER — Inpatient Hospital Stay (HOSPITAL_COMMUNITY)
Admission: EM | Admit: 2019-12-02 | Discharge: 2019-12-03 | Disposition: A | Discharge status: Home / Self Care | Payer: 59 | Attending: Obstetrics & Gynecology | Admitting: Obstetrics & Gynecology

## 2019-12-02 DIAGNOSIS — O99321 Drug use complicating pregnancy, first trimester: Secondary | ICD-10-CM | POA: Insufficient documentation

## 2019-12-02 DIAGNOSIS — R109 Unspecified abdominal pain: Secondary | ICD-10-CM | POA: Insufficient documentation

## 2019-12-02 DIAGNOSIS — F1290 Cannabis use, unspecified, uncomplicated: Secondary | ICD-10-CM | POA: Insufficient documentation

## 2019-12-02 DIAGNOSIS — O99281 Endocrine, nutritional and metabolic diseases complicating pregnancy, first trimester: Secondary | ICD-10-CM | POA: Insufficient documentation

## 2019-12-02 DIAGNOSIS — Z3A01 Less than 8 weeks gestation of pregnancy: Secondary | ICD-10-CM | POA: Insufficient documentation

## 2019-12-02 DIAGNOSIS — F411 Generalized anxiety disorder: Secondary | ICD-10-CM | POA: Insufficient documentation

## 2019-12-02 DIAGNOSIS — O26899 Other specified pregnancy related conditions, unspecified trimester: Secondary | ICD-10-CM

## 2019-12-02 DIAGNOSIS — F32A Depression, unspecified: Secondary | ICD-10-CM | POA: Diagnosis not present

## 2019-12-02 DIAGNOSIS — Z79899 Other long term (current) drug therapy: Secondary | ICD-10-CM | POA: Insufficient documentation

## 2019-12-02 DIAGNOSIS — R079 Chest pain, unspecified: Secondary | ICD-10-CM | POA: Diagnosis not present

## 2019-12-02 DIAGNOSIS — O99341 Other mental disorders complicating pregnancy, first trimester: Secondary | ICD-10-CM | POA: Diagnosis not present

## 2019-12-02 DIAGNOSIS — O99611 Diseases of the digestive system complicating pregnancy, first trimester: Secondary | ICD-10-CM | POA: Insufficient documentation

## 2019-12-02 DIAGNOSIS — E876 Hypokalemia: Secondary | ICD-10-CM | POA: Diagnosis not present

## 2019-12-02 DIAGNOSIS — Z3201 Encounter for pregnancy test, result positive: Secondary | ICD-10-CM

## 2019-12-02 DIAGNOSIS — O219 Vomiting of pregnancy, unspecified: Secondary | ICD-10-CM | POA: Diagnosis not present

## 2019-12-02 DIAGNOSIS — R1084 Generalized abdominal pain: Secondary | ICD-10-CM | POA: Diagnosis not present

## 2019-12-02 DIAGNOSIS — K219 Gastro-esophageal reflux disease without esophagitis: Secondary | ICD-10-CM | POA: Insufficient documentation

## 2019-12-02 DIAGNOSIS — O2691 Pregnancy related conditions, unspecified, first trimester: Secondary | ICD-10-CM | POA: Diagnosis not present

## 2019-12-02 DIAGNOSIS — O26891 Other specified pregnancy related conditions, first trimester: Secondary | ICD-10-CM | POA: Diagnosis not present

## 2019-12-02 DIAGNOSIS — Z3A00 Weeks of gestation of pregnancy not specified: Secondary | ICD-10-CM | POA: Diagnosis not present

## 2019-12-02 DIAGNOSIS — R112 Nausea with vomiting, unspecified: Secondary | ICD-10-CM

## 2019-12-02 DIAGNOSIS — N83291 Other ovarian cyst, right side: Secondary | ICD-10-CM | POA: Diagnosis not present

## 2019-12-02 DIAGNOSIS — R1013 Epigastric pain: Secondary | ICD-10-CM | POA: Diagnosis not present

## 2019-12-02 LAB — COMPREHENSIVE METABOLIC PANEL
ALT: 20 U/L (ref 0–44)
AST: 20 U/L (ref 15–41)
Albumin: 4.5 g/dL (ref 3.5–5.0)
Alkaline Phosphatase: 47 U/L (ref 38–126)
Anion gap: 11 (ref 5–15)
BUN: 10 mg/dL (ref 6–20)
CO2: 21 mmol/L — ABNORMAL LOW (ref 22–32)
Calcium: 8.8 mg/dL — ABNORMAL LOW (ref 8.9–10.3)
Chloride: 100 mmol/L (ref 98–111)
Creatinine, Ser: 0.68 mg/dL (ref 0.44–1.00)
GFR, Estimated: >60 mL/min (ref >60)
Glucose, Bld: 135 mg/dL — ABNORMAL HIGH (ref 70–99)
Potassium: 2.5 mmol/L — CL (ref 3.5–5.1)
Sodium: 132 mmol/L — ABNORMAL LOW (ref 135–145)
Total Bilirubin: 0.8 mg/dL (ref 0.3–1.2)
Total Protein: 7.9 g/dL (ref 6.5–8.1)

## 2019-12-02 LAB — CBC WITH DIFFERENTIAL/PLATELET
Abs Immature Granulocytes: 0.1 10*3/uL — ABNORMAL HIGH (ref 0.00–0.07)
Basophils Absolute: 0 10*3/uL (ref 0.0–0.1)
Basophils Relative: 0 %
Eosinophils Absolute: 0 10*3/uL (ref 0.0–0.5)
Eosinophils Relative: 0 %
HCT: 37.7 % (ref 36.0–46.0)
Hemoglobin: 12.6 g/dL (ref 12.0–15.0)
Immature Granulocytes: 1 %
Lymphocytes Relative: 18 %
Lymphs Abs: 3.2 10*3/uL (ref 0.7–4.0)
MCH: 28.8 pg (ref 26.0–34.0)
MCHC: 33.4 g/dL (ref 30.0–36.0)
MCV: 86.1 fL (ref 80.0–100.0)
Monocytes Absolute: 1.2 10*3/uL — ABNORMAL HIGH (ref 0.1–1.0)
Monocytes Relative: 7 %
Neutro Abs: 12.7 10*3/uL — ABNORMAL HIGH (ref 1.7–7.7)
Neutrophils Relative %: 74 %
Platelets: 271 10*3/uL (ref 150–400)
RBC: 4.38 MIL/uL (ref 3.87–5.11)
RDW: 12.6 % (ref 11.5–15.5)
WBC: 17.2 10*3/uL — ABNORMAL HIGH (ref 4.0–10.5)
nRBC: 0 % (ref 0.0–0.2)

## 2019-12-02 LAB — I-STAT BETA HCG BLOOD, ED (MC, WL, AP ONLY): I-stat hCG, quantitative: >2000 m[IU]/mL — ABNORMAL HIGH (ref <5)

## 2019-12-02 LAB — LIPASE, BLOOD: Lipase: 25 U/L (ref 11–51)

## 2019-12-02 MED ORDER — POTASSIUM CHLORIDE 10 MEQ/100ML IV SOLN
10.0000 meq | INTRAVENOUS | Status: AC
  Administered 2019-12-02 – 2019-12-03 (×2): 10 meq via INTRAVENOUS
  Filled 2019-12-02 (×2): qty 100

## 2019-12-02 MED ORDER — SODIUM CHLORIDE 0.9 % IV BOLUS
1000.0000 mL | Freq: Once | INTRAVENOUS | Status: AC
  Administered 2019-12-02: 1000 mL via INTRAVENOUS

## 2019-12-02 MED ORDER — ONDANSETRON HCL 4 MG/2ML IJ SOLN
4.0000 mg | Freq: Once | INTRAMUSCULAR | Status: AC
  Administered 2019-12-02: 4 mg via INTRAVENOUS
  Filled 2019-12-02: qty 2

## 2019-12-02 NOTE — ED Notes (Signed)
Attempt IV placement x2 without success.  Second RN to attempt placement

## 2019-12-02 NOTE — ED Notes (Signed)
Date and time results received: 12/02/19 11:39 PM  (use smartphrase ".now" to insert current time)  Test: Potassium Critical Value: 2.5  Name of Provider Notified: Mia M. PA  Orders Received? Or Actions Taken?:

## 2019-12-02 NOTE — ED Provider Notes (Signed)
[facility]-EMERGENCY DEPT Provider Note   CSN: [id number] Arrival date & time: 12/02/19  2223     History Chief Complaint  Patient presents with  . Emesis    Diana Thomas is a 24 y.o. female, G1P0, with a history of anxiety, depression, GERD who presents to the emergency department with a chief complaint of vomiting.  The patient reports that she has had countless episodes of nonbloody, nonbilious vomiting and nausea for the last 4 days accompanied by epigastric abdominal pain.  She has had countless episodes of vomiting in the last 24 hours.  She is actively dry heaving in the room on exam.  She is endorsing constant epigastric pain.  No known aggravating or alleviating factors.  Pain is nonradiating.  She characterizes the pain as squeezing.  She has taken multiple medications to try and treat her symptoms today - including 25 mg of phenergan x3, 50 mg of Vistaril, 200 mg of Seroquel, 2 mg of Haldol, 0.5 mg of Ativan and has applied an icy hot patch to her upper abdomen.  She reports that yesterday she took a Dulcolax suppository for her abdominal pain and subsequently had a large bowel movement.  No improvement in her pain despite treatment.    She states that she has taken all of the medication "so I can sleep".  She repeats over and over in the room "I just want to go sleep. I just want to go to sleep."  States that she has barely slept the last few nights since her symptoms began.  She also reports that she is upset that she had a positive pregnancy test at home 1 hour prior to arrival.  No history of prior pregnancy.    She reports that she was prompted to come to the ER for evaluation of her symptoms after having a positive pregnancy test given that she had taken numerous medications to try and relieve her symptoms. She denies SI or HI.  LMP was approximately 2 months ago.  She is unsure of the date.  She denies vaginal bleeding, vaginal discharge, fever,  chills, diarrhea, dysuria, constipation, hematuria, back pain, flank pain, cough, shortness of breath, rash, URI symptoms.   She denies tobacco use.  She smokes marijuana approximately 4 times per week.  She denies alcohol use.  No illicit or recreational substance use.  The history is provided by the patient and medical records. No language interpreter was used.       Past Medical History:  Diagnosis Date  . Allergy   . Anemia   . Anxiety   . Depression   . Gastritis   . GERD (gastroesophageal reflux disease)   . Heart murmur     Patient Active Problem List   Diagnosis Date Noted  . Moderate episode of recurrent major depressive disorder (HCC) 04/29/2018  . Gastroesophageal reflux disease without esophagitis 04/29/2018  . Generalized anxiety disorder 09/16/2017    Past Surgical History:  Procedure Laterality Date  . NO PAST SURGERIES       OB History   No obstetric history on file.     Family History  Problem Relation Age of Onset  . Heart disease Mother   . Hypertension Mother   . Hyperlipidemia Mother   . ADD / ADHD Father   . Healthy Father   . Healthy Sister   . Liver disease Maternal Grandmother   . Diabetes Maternal Uncle   . Colon cancer Neg Hx   . Esophageal  cancer Neg Hx   . Stomach cancer Neg Hx   . Rectal cancer Neg Hx     Social History   Tobacco Use  . Smoking status: Never Smoker  . Smokeless tobacco: Never Used  Vaping Use  . Vaping Use: Never used  Substance Use Topics  . Alcohol use: Yes    Comment: occasional drinker  . Drug use: Never    Home Medications Prior to Admission medications   Medication Sig Start Date End Date Taking? Authorizing Provider  busPIRone (BUSPAR) 15 MG tablet Take 1 tablet (15 mg total) by mouth at bedtime. 08/28/19   Myles Lipps, MD  dicyclomine (BENTYL) 20 MG tablet Take 1 tablet (20 mg total) by mouth every 8 (eight) hours as needed for spasms (Abdominal cramping). 05/31/19   Ward, Layla Maw, DO    escitalopram (LEXAPRO) 20 MG tablet Take 1 tablet (20 mg total) by mouth at bedtime. 08/28/19   Myles Lipps, MD  medroxyPROGESTERone (DEPO-PROVERA) 150 MG/ML injection Inject 1 mL (150 mg total) into the muscle every 3 (three) months. 01/30/19   Myles Lipps, MD  ondansetron (ZOFRAN ODT) 8 MG disintegrating tablet Take 1 tablet (8 mg total) by mouth every 8 (eight) hours as needed for nausea or vomiting. 08/28/19   Myles Lipps, MD  pantoprazole (PROTONIX) 40 MG tablet Take 1-2 tablets (40-80 mg total) by mouth daily. Patient taking differently: Take 40 mg by mouth 2 (two) times daily.  10/12/18   Myles Lipps, MD  promethazine (PHENERGAN) 12.5 MG tablet TAKE 1 TABLET (12.5 MG TOTAL) BY MOUTH EVERY 8 (EIGHT) HOURS AS NEEDED FOR NAUSEA OR VOMITING. 02/26/19   Myles Lipps, MD  promethazine (PHENERGAN) 25 MG tablet Take 1 tablet (25 mg total) by mouth every 6 (six) hours as needed for nausea or vomiting. 08/28/19   Myles Lipps, MD  QUEtiapine (SEROQUEL) 100 MG tablet TAKE 1 TABLET BY MOUTH AT BEDTIME 11/26/19   Myles Lipps, MD    Allergies    Patient has no known allergies.  Review of Systems   Review of Systems  Constitutional: Negative for activity change, chills and fever.  HENT: Negative for congestion and sore throat.   Eyes: Negative for visual disturbance.  Respiratory: Negative for choking, shortness of breath and wheezing.   Cardiovascular: Negative for chest pain, palpitations and leg swelling.  Gastrointestinal: Positive for abdominal pain, nausea and vomiting. Negative for anal bleeding, blood in stool, constipation and diarrhea.  Genitourinary: Negative for dysuria, flank pain, frequency, hematuria, urgency, vaginal bleeding and vaginal pain.  Musculoskeletal: Negative for back pain and myalgias.  Skin: Negative for rash.  Allergic/Immunologic: Negative for immunocompromised state.  Neurological: Negative for seizures, syncope, weakness, numbness and  headaches.  Psychiatric/Behavioral: Negative for confusion.    Physical Exam Updated Vital Signs BP (!) 127/59 (BP Location: Right Arm)   Pulse 72   Temp 98.3 F (36.8 C) (Oral)   Resp 14   SpO2 100%   Physical Exam Vitals and nursing note reviewed.  Constitutional:      General: She is not in acute distress.    Comments: Tearful.  Dry heaving and retching.  HENT:     Head: Normocephalic.     Mouth/Throat:     Pharynx: No oropharyngeal exudate or posterior oropharyngeal erythema.  Eyes:     Conjunctiva/sclera: Conjunctivae normal.  Cardiovascular:     Rate and Rhythm: Normal rate and regular rhythm.     Pulses: Normal  pulses.     Heart sounds: Normal heart sounds. No murmur heard.  No friction rub. No gallop.   Pulmonary:     Effort: Pulmonary effort is normal. No respiratory distress.     Breath sounds: No stridor. No wheezing, rhonchi or rales.  Chest:     Chest wall: No tenderness.  Abdominal:     General: There is no distension.     Palpations: Abdomen is soft. There is no mass.     Tenderness: There is abdominal tenderness. There is no right CVA tenderness, left CVA tenderness, guarding or rebound.     Hernia: No hernia is present.     Comments: IcyHot patch on the upper abdomen.  Abdomen is soft and nondistended.  Hypoactive bowel sounds in all 4 quadrants.  Diffusely tender to palpation throughout the abdomen.  No tenderness over McBurney's point.  Negative Murphy sign.  No CVA tenderness bilaterally.  Genitourinary:    Comments: Cervix is closed.  She has adnexal tenderness on the right.  No palpable adnexal masses.  No left adnexal tenderness or fullness.  No cervical motion tenderness.  Scant white discharge noted in the vaginal vault. Musculoskeletal:     Cervical back: Neck supple.  Skin:    General: Skin is warm.     Capillary Refill: Capillary refill takes 2 to 3 seconds.     Findings: No rash.  Neurological:     Mental Status: She is alert.    Psychiatric:        Attention and Perception: She perceives auditory hallucinations. She does not perceive visual hallucinations.        Mood and Affect: Mood is anxious and depressed. Affect is tearful.        Speech: Speech normal.        Thought Content: Thought content does not include homicidal or suicidal ideation. Thought content does not include homicidal or suicidal plan.        Judgment: Judgment is impulsive.     ED Results / Procedures / Treatments   Labs (all labs ordered are listed, but only abnormal results are displayed) Labs Reviewed  WET PREP, GENITAL - Abnormal; Notable for the following components:      Result Value   Clue Cells Wet Prep HPF POC PRESENT (*)    WBC, Wet Prep HPF POC FEW (*)    All other components within normal limits  CBC WITH DIFFERENTIAL/PLATELET - Abnormal; Notable for the following components:   WBC 17.2 (*)    Neutro Abs 12.7 (*)    Monocytes Absolute 1.2 (*)    Abs Immature Granulocytes 0.10 (*)    All other components within normal limits  COMPREHENSIVE METABOLIC PANEL - Abnormal; Notable for the following components:   Sodium 132 (*)    Potassium 2.5 (*)    CO2 21 (*)    Glucose, Bld 135 (*)    Calcium 8.8 (*)    All other components within normal limits  HCG, QUANTITATIVE, PREGNANCY - Abnormal; Notable for the following components:   hCG, Beta Chain, Quant, S 24,439 (*)    All other components within normal limits  I-STAT BETA HCG BLOOD, ED ([facility], [facility], [facility] ONLY) - Abnormal; Notable for the following components:   I-stat hCG, quantitative >2,000.0 (*)    All other components within normal limits  LIPASE, BLOOD  URINALYSIS, ROUTINE W REFLEX MICROSCOPIC  MAGNESIUM  TYPE AND SCREEN  GC/CHLAMYDIA PROBE AMP ([place]    EKG  EKG Interpretation  Date/Time:  Sunday December 02 2019 23:28:05 EDT Ventricular Rate:  65 PR Interval:    QRS Duration: 80 QT Interval:  374 QTC Calculation: 389 R Axis:   73 Text  Interpretation: Sinus rhythm Atrial premature complex Confirmed by Palumbo, April ([id number]) on 12/02/2019 11:44:36 PM   Radiology No results found.  Procedures Procedures (including critical care time)  Medications Ordered in ED Medications  potassium chloride 10 mEq in 100 mL IVPB (10 mEq Intravenous Transfusing/Transfer 12/03/19 0109)  ondansetron (ZOFRAN) injection 4 mg (4 mg Intravenous Given 12/02/19 2353)  sodium chloride 0.9 % bolus 1,000 mL (1,000 mLs Intravenous Transfusing/Transfer 12/03/19 0108)  prochlorperazine (COMPAZINE) injection 10 mg (10 mg Intravenous Given 12/03/19 0039)  diphenhydrAMINE (BENADRYL) injection 12.5 mg (12.5 mg Intravenous Given 12/03/19 0038)    ED Course  I have reviewed the triage vital signs and the nursing notes.  Pertinent labs & imaging results that were available during my care of the patient were reviewed by me and considered in my medical decision making (see chart for details).    MDM Rules/Calculators/A&P                          24  year old female with a history of anxiety, depression, GERD presenting with nausea, vomiting, and abdominal pain for 4 days.   She was initially hypertensive on arrival to the ER, but this is improved.  No tachycardia.  She is afebrile.  She is generally tender to palpation on abdominal exam, but she has no focal tenderness.  Pelvic exam with adnexal tenderness on the right, but no palpable masses.  Left adnexa is unremarkable.  Cervix is closed and there is no cervical motion tenderness.  Labs have been reviewed and independently interpreted by me.  She has a positive i-STAT hCG of greater than 2000.  Quantitative hCG and type and screen have been ordered.  Given positive pregnancy test with right-sided adnexal tenderness and abdominal pain, differential diagnosis includes ectopic pregnancy or miscarriage.  However, I am less suspicious of both of these diagnoses as she is predominantly endorsing upper  abdominal pain.  Her presentation is more consistent with hyperemesis gravidarum.  However, she will need a pelvic ultrasound.  Wet prep is pending.  GC chlamydia is pending.  Spoke with Dr. , OB/GYN, as patient is dehydrated, symptomatic with retching, hypokalemic, and has multiple medications on board.  He recommends transferring the patient to the MAU.  Pelvic ultrasound will be performed once the patient is at the MAU.  She is hypokalemic, likely secondary to vomiting.  2 runs of potassium chloride have been ordered at this time.  Will defer oral potassium chloride until she is less symptomatic.  Following Zofran, patient continued to actively be retching at the bedside. Dr. Despina Hidden was agreeable to treating the patient's symptoms with Compazine and Benadryl as patient had requested these medications as she previously had had good symptomatic relief with this combination.  She is also received a bolus of normal saline.  She has a leukocytosis of 17,000.  This could be reactive as she is not having any other infectious symptoms.   The patient appears reasonably stabilized for admission considering the current resources, flow, and capabilities available in the ED at this time, and I doubt any other [facility] requiring further screening and/or treatment in the ED prior to transfer.   Final Clinical Impression(s) / ED Diagnoses Final diagnoses:  Generalized abdominal pain  Intractable vomiting with  nausea, unspecified vomiting type  Positive pregnancy test  Hypokalemia    Rx / DC Orders ED Discharge Orders    None       Barkley Boards, PA-C 12/03/19 0124    Palumbo, April, MD 12/03/19 0134

## 2019-12-02 NOTE — ED Triage Notes (Signed)
Patient reports N/V x4 days. Reports + pregnancy test x1 hour ago. LMP x2 months ago.

## 2019-12-03 ENCOUNTER — Other Ambulatory Visit (HOSPITAL_COMMUNITY): Payer: Self-pay | Admitting: Advanced Practice Midwife

## 2019-12-03 ENCOUNTER — Encounter (HOSPITAL_COMMUNITY): Payer: Self-pay | Admitting: *Deleted

## 2019-12-03 ENCOUNTER — Other Ambulatory Visit: Payer: Self-pay

## 2019-12-03 ENCOUNTER — Inpatient Hospital Stay (HOSPITAL_COMMUNITY): Payer: 59

## 2019-12-03 DIAGNOSIS — F1290 Cannabis use, unspecified, uncomplicated: Secondary | ICD-10-CM | POA: Diagnosis not present

## 2019-12-03 DIAGNOSIS — E876 Hypokalemia: Secondary | ICD-10-CM | POA: Diagnosis not present

## 2019-12-03 DIAGNOSIS — Z3A00 Weeks of gestation of pregnancy not specified: Secondary | ICD-10-CM | POA: Diagnosis not present

## 2019-12-03 DIAGNOSIS — O219 Vomiting of pregnancy, unspecified: Secondary | ICD-10-CM | POA: Diagnosis not present

## 2019-12-03 DIAGNOSIS — O99611 Diseases of the digestive system complicating pregnancy, first trimester: Secondary | ICD-10-CM | POA: Diagnosis not present

## 2019-12-03 DIAGNOSIS — O26891 Other specified pregnancy related conditions, first trimester: Secondary | ICD-10-CM | POA: Diagnosis not present

## 2019-12-03 DIAGNOSIS — O2691 Pregnancy related conditions, unspecified, first trimester: Secondary | ICD-10-CM

## 2019-12-03 DIAGNOSIS — Z3A01 Less than 8 weeks gestation of pregnancy: Secondary | ICD-10-CM | POA: Diagnosis not present

## 2019-12-03 DIAGNOSIS — R109 Unspecified abdominal pain: Secondary | ICD-10-CM | POA: Diagnosis not present

## 2019-12-03 DIAGNOSIS — O99281 Endocrine, nutritional and metabolic diseases complicating pregnancy, first trimester: Secondary | ICD-10-CM | POA: Diagnosis not present

## 2019-12-03 DIAGNOSIS — Z79899 Other long term (current) drug therapy: Secondary | ICD-10-CM | POA: Diagnosis not present

## 2019-12-03 DIAGNOSIS — Z3201 Encounter for pregnancy test, result positive: Secondary | ICD-10-CM | POA: Diagnosis present

## 2019-12-03 DIAGNOSIS — R079 Chest pain, unspecified: Secondary | ICD-10-CM | POA: Diagnosis not present

## 2019-12-03 DIAGNOSIS — N83291 Other ovarian cyst, right side: Secondary | ICD-10-CM | POA: Diagnosis not present

## 2019-12-03 DIAGNOSIS — F32A Depression, unspecified: Secondary | ICD-10-CM | POA: Diagnosis not present

## 2019-12-03 DIAGNOSIS — K219 Gastro-esophageal reflux disease without esophagitis: Secondary | ICD-10-CM | POA: Diagnosis not present

## 2019-12-03 DIAGNOSIS — R1084 Generalized abdominal pain: Secondary | ICD-10-CM | POA: Diagnosis not present

## 2019-12-03 DIAGNOSIS — O99321 Drug use complicating pregnancy, first trimester: Secondary | ICD-10-CM | POA: Diagnosis not present

## 2019-12-03 DIAGNOSIS — O99341 Other mental disorders complicating pregnancy, first trimester: Secondary | ICD-10-CM | POA: Diagnosis not present

## 2019-12-03 DIAGNOSIS — F411 Generalized anxiety disorder: Secondary | ICD-10-CM | POA: Diagnosis not present

## 2019-12-03 LAB — WET PREP, GENITAL
Sperm: NONE SEEN
Trich, Wet Prep: NONE SEEN
Yeast Wet Prep HPF POC: NONE SEEN

## 2019-12-03 LAB — POTASSIUM: Potassium: 3.8 mmol/L (ref 3.5–5.1)

## 2019-12-03 LAB — GC/CHLAMYDIA PROBE AMP (~~LOC~~) NOT AT ARMC
Chlamydia: NEGATIVE
Comment: NEGATIVE
Comment: NORMAL
Neisseria Gonorrhea: NEGATIVE

## 2019-12-03 LAB — HCG, QUANTITATIVE, PREGNANCY: hCG, Beta Chain, Quant, S: 24439 m[IU]/mL — ABNORMAL HIGH (ref <5)

## 2019-12-03 MED ORDER — POTASSIUM CHLORIDE ER 10 MEQ PO TBCR: 10.0000 meq | EXTENDED_RELEASE_TABLET | Freq: Two times a day (BID) | ORAL | 0 refills | Status: DC

## 2019-12-03 MED ORDER — DIPHENHYDRAMINE HCL 50 MG/ML IJ SOLN
25.0000 mg | Freq: Once | INTRAMUSCULAR | Status: AC
  Administered 2019-12-03: 25 mg via INTRAVENOUS
  Filled 2019-12-03: qty 1

## 2019-12-03 MED ORDER — DIPHENHYDRAMINE HCL 50 MG/ML IJ SOLN
12.5000 mg | Freq: Once | INTRAMUSCULAR | Status: AC
  Administered 2019-12-03: 12.5 mg via INTRAVENOUS
  Filled 2019-12-03: qty 1

## 2019-12-03 MED ORDER — POTASSIUM CHLORIDE 10 MEQ/100ML IV SOLN: 10.0000 meq | INTRAVENOUS | Status: DC

## 2019-12-03 MED ORDER — PROCHLORPERAZINE EDISYLATE 10 MG/2ML IJ SOLN
10.0000 mg | Freq: Once | INTRAMUSCULAR | Status: AC
  Administered 2019-12-03: 10 mg via INTRAVENOUS
  Filled 2019-12-03: qty 2

## 2019-12-03 MED ORDER — HALOPERIDOL LACTATE 5 MG/ML IJ SOLN
5.0000 mg | Freq: Once | INTRAMUSCULAR | Status: AC
  Administered 2019-12-03: 5 mg via INTRAMUSCULAR
  Filled 2019-12-03: qty 1

## 2019-12-03 MED ORDER — POTASSIUM CHLORIDE 10 MEQ/100ML IV SOLN
10.0000 meq | INTRAVENOUS | Status: AC
  Administered 2019-12-03 (×3): 10 meq via INTRAVENOUS
  Filled 2019-12-03 (×3): qty 100

## 2019-12-03 MED ORDER — SCOPOLAMINE 1 MG/3DAYS TD PT72
1.0000 | MEDICATED_PATCH | TRANSDERMAL | Status: DC
  Administered 2019-12-03: 1.5 mg via TRANSDERMAL
  Filled 2019-12-03: qty 1

## 2019-12-03 MED ORDER — FAMOTIDINE 10 MG PO TABS
10.0000 mg | ORAL_TABLET | Freq: Two times a day (BID) | ORAL | 0 refills | Status: DC
Start: 2019-12-03 — End: 2021-04-27

## 2019-12-03 MED ORDER — HALOPERIDOL LACTATE 5 MG/ML IJ SOLN: 5.0000 mg | Freq: Once | INTRAMUSCULAR | Status: DC

## 2019-12-03 MED ORDER — SODIUM CHLORIDE 0.9 % IV SOLN: Freq: Once | INTRAVENOUS | Status: AC

## 2019-12-03 MED ORDER — SCOPOLAMINE 1 MG/3DAYS TD PT72
1.0000 | MEDICATED_PATCH | TRANSDERMAL | 0 refills | Status: DC
Start: 2019-12-06 — End: 2019-12-06

## 2019-12-03 MED FILL — SCOPOLAMINE 1 MG/3 DAY PATC: 1 | 30 days supply | Qty: 10 | Fill #0

## 2019-12-03 NOTE — MAU Provider Note (Signed)
History     CSN: [id number]  Arrival date and time: 12/02/19 2223   First Provider Initiated Contact with Patient 12/03/19 0148      Chief Complaint  Patient presents with  . Emesis   HPI Diana Thomas is a 24 y.o. G1P0 at [redacted]w[redacted]d who presents to MAU from [facility] via EMS for ongoing management of Hypokalemia and upper abdominal pain. Patient endorses severe vomiting x 4 days. She has preexisting prescriptions for Zofran and Phenergan.  She was able to tolerate plain crackers on 12/02/2019. She denies lower abdominal pain, vaginal bleeding, dysuria, fever or recent illness.    Patient endorses irregular THC use. She has not yet selected a prenatal care provider.  OB History    Gravida  1   Para      Term      Preterm      AB      Living        SAB      TAB      Ectopic      Multiple      Live Births              Past Medical History:  Diagnosis Date  . Allergy   . Anemia   . Anxiety   . Depression   . Gastritis   . GERD (gastroesophageal reflux disease)   . Heart murmur     Past Surgical History:  Procedure Laterality Date  . NO PAST SURGERIES      Family History  Problem Relation Age of Onset  . Heart disease Mother   . Hypertension Mother   . Hyperlipidemia Mother   . ADD / ADHD Father   . Healthy Father   . Healthy Sister   . Liver disease Maternal Grandmother   . Diabetes Maternal Uncle   . Colon cancer Neg Hx   . Esophageal cancer Neg Hx   . Stomach cancer Neg Hx   . Rectal cancer Neg Hx     Social History   Tobacco Use  . Smoking status: Never Smoker  . Smokeless tobacco: Never Used  Vaping Use  . Vaping Use: Never used  Substance Use Topics  . Alcohol use: Yes    Comment: occasional drinker  . Drug use: Yes    Types: Marijuana    Allergies: No Known Allergies  Medications Prior to Admission  Medication Sig Dispense Refill Last Dose  . busPIRone (BUSPAR) 15 MG tablet Take 1 tablet (15 mg total) by mouth at bedtime. 90  tablet 3 12/02/2019 at Unknown time  . dicyclomine (BENTYL) 20 MG tablet Take 1 tablet (20 mg total) by mouth every 8 (eight) hours as needed for spasms (Abdominal cramping). 15 tablet 0 12/02/2019 at Unknown time  . escitalopram (LEXAPRO) 20 MG tablet Take 1 tablet (20 mg total) by mouth at bedtime. 90 tablet 1 12/02/2019 at Unknown time  . ondansetron (ZOFRAN ODT) 8 MG disintegrating tablet Take 1 tablet (8 mg total) by mouth every 8 (eight) hours as needed for nausea or vomiting. 20 tablet 3 12/02/2019 at Unknown time  . promethazine (PHENERGAN) 12.5 MG tablet TAKE 1 TABLET (12.5 MG TOTAL) BY MOUTH EVERY 8 (EIGHT) HOURS AS NEEDED FOR NAUSEA OR VOMITING. 20 tablet 0 12/02/2019 at Unknown time  . QUEtiapine (SEROQUEL) 100 MG tablet TAKE 1 TABLET BY MOUTH AT BEDTIME 30 tablet 1 12/02/2019 at Unknown time  . medroxyPROGESTERone (DEPO-PROVERA) 150 MG/ML injection Inject 1 mL (150 mg total) into  the muscle every 3 (three) months. 1 mL 0   . pantoprazole (PROTONIX) 40 MG tablet Take 1-2 tablets (40-80 mg total) by mouth daily. (Patient taking differently: Take 40 mg by mouth 2 (two) times daily. ) 90 tablet 3   . promethazine (PHENERGAN) 25 MG tablet Take 1 tablet (25 mg total) by mouth every 6 (six) hours as needed for nausea or vomiting. 30 tablet 0     Review of Systems  Gastrointestinal: Positive for abdominal pain, nausea and vomiting.  Genitourinary: Negative for vaginal bleeding, vaginal discharge and vaginal pain.  All other systems reviewed and are negative.  Physical Exam   Blood pressure 118/61, pulse 77, temperature 98.7 F (37.1 C), temperature source Oral, resp. rate 15, last menstrual period 10/31/2019, SpO2 100 %.  Physical Exam Vitals and nursing note reviewed. Exam conducted with a chaperone present.  Constitutional:      Appearance: She is ill-appearing.  Cardiovascular:     Rate and Rhythm: Normal rate.     Pulses: Normal pulses.     Heart sounds: Normal heart sounds.   Pulmonary:     Effort: Pulmonary effort is normal.  Abdominal:     General: Abdomen is flat. Bowel sounds are normal.     Tenderness: There is no abdominal tenderness. There is no right CVA tenderness or left CVA tenderness.  Skin:    General: Skin is warm and dry.     Capillary Refill: Capillary refill takes less than 2 seconds.  Neurological:     General: No focal deficit present.  Psychiatric:        Mood and Affect: Mood normal.        Thought Content: Thought content normal.        Judgment: Judgment normal.     MAU Course  Procedures   --WLED interventions reviewed. Pt s/p 2 runs of 10 mEq KCL at [facility] --Notified by Pharmacist that repeat K is required after 4th KCl run, per policy. Ordered --Patient reports THC use. Treat for Cannabis Hyperemesis   Patient Vitals for the past 24 hrs:  BP Temp Temp src Pulse Resp SpO2  12/03/19 0627 127/80 -- -- 78 -- --  12/03/19 0135 -- -- -- -- -- 100 %  12/03/19 0131 118/61 98.7 F (37.1 C) Oral 77 15 100 %  12/03/19 0054 (!) 127/59 98.3 F (36.8 C) Oral 72 14 100 %  12/03/19 0018 (!) 148/85 -- -- 63 14 100 %  12/02/19 2231 (!) 159/91 98.4 F (36.9 C) Oral 68 19 98 %   Results for orders placed or performed during the hospital encounter of 12/02/19 (from the past 24 hour(s))  CBC with Differential     Status: Abnormal   Collection Time: 12/02/19 11:02 PM  Result Value Ref Range   WBC 17.2 (H) 4.0 - 10.5 K/uL   RBC 4.38 3.87 - 5.11 MIL/uL   Hemoglobin 12.6 12.0 - 15.0 g/dL   HCT 29.5 36 - 46 %   MCV 86.1 80.0 - 100.0 fL   MCH 28.8 26.0 - 34.0 pg   MCHC 33.4 30.0 - 36.0 g/dL   RDW 18.8 41.6 - 60.6 %   Platelets 271 150 - 400 K/uL   nRBC 0.0 0.0 - 0.2 %   Neutrophils Relative % 74 %   Neutro Abs 12.7 (H) 1.7 - 7.7 K/uL   Lymphocytes Relative 18 %   Lymphs Abs 3.2 0.7 - 4.0 K/uL   Monocytes Relative 7 %   Monocytes  Absolute 1.2 (H) 0.1 - 1.0 K/uL   Eosinophils Relative 0 %   Eosinophils Absolute 0.0 0.0 - 0.5 K/uL    Basophils Relative 0 %   Basophils Absolute 0.0 0.0 - 0.1 K/uL   Immature Granulocytes 1 %   Abs Immature Granulocytes 0.10 (H) 0.00 - 0.07 K/uL  Comprehensive metabolic panel     Status: Abnormal   Collection Time: 12/02/19 11:02 PM  Result Value Ref Range   Sodium 132 (L) 135 - 145 mmol/L   Potassium 2.5 (LL) 3.5 - 5.1 mmol/L   Chloride 100 98 - 111 mmol/L   CO2 21 (L) 22 - 32 mmol/L   Glucose, Bld 135 (H) 70 - 99 mg/dL   BUN 10 6 - 20 mg/dL   Creatinine, Ser 1.610.68 0.44 - 1.00 mg/dL   Calcium 8.8 (L) 8.9 - 10.3 mg/dL   Total Protein 7.9 6.5 - 8.1 g/dL   Albumin 4.5 3.5 - 5.0 g/dL   AST 20 15 - 41 U/L   ALT 20 0 - 44 U/L   Alkaline Phosphatase 47 38 - 126 U/L   Total Bilirubin 0.8 0.3 - 1.2 mg/dL   GFR, Estimated >09>60 >60>60 mL/min   Anion gap 11 5 - 15  Lipase, blood     Status: None   Collection Time: 12/02/19 11:02 PM  Result Value Ref Range   Lipase 25 11 - 51 U/L  hCG, quantitative, pregnancy     Status: Abnormal   Collection Time: 12/02/19 11:02 PM  Result Value Ref Range   hCG, Beta Chain, Quant, S 24,439 (H) <5 mIU/mL  I-Stat Beta hCG blood, ED ([facility], [facility], [facility] only)     Status: Abnormal   Collection Time: 12/02/19 11:07 PM  Result Value Ref Range   I-stat hCG, quantitative >2,000.0 (H) <5 mIU/mL   Comment 3          Wet prep, genital     Status: Abnormal   Collection Time: 12/02/19 11:21 PM   Specimen: Vaginal  Result Value Ref Range   Yeast Wet Prep HPF POC NONE SEEN NONE SEEN   Trich, Wet Prep NONE SEEN NONE SEEN   Clue Cells Wet Prep HPF POC PRESENT (A) NONE SEEN   WBC, Wet Prep HPF POC FEW (A) NONE SEEN   Sperm NONE SEEN    US OB Comp Less 14 Wks  Result Date: 12/03/2019 CLINICAL DATA:  24 year old pregnant female with abdominal pain. LMP: 10/31/2019 corresponding to an estimated gestational age of [redacted] weeks, 5 days. EXAM: OBSTETRIC <14 WK US AND TRANSVAGINAL OB US TECHNIQUE: Both transabdominal and transvaginal ultrasound examinations were performed for complete  evaluation of the gestation as well as the maternal uterus, adnexal regions, and pelvic cul-de-sac. Transvaginal technique was performed to assess early pregnancy. COMPARISON:  None. FINDINGS: Intrauterine gestational sac: Single intrauterine gestational sac. Yolk sac:  Seen Embryo:  Present Cardiac Activity: Not detected. Heart Rate: N/A  bpm CRL:  2 mm   5 w   5 d                  US EDC: 07/30/2020 Subchorionic hemorrhage:  None visualized. Maternal uterus/adnexae: There is a 5.9 x 5.2 x 5.8 cm hemorrhagic cyst in the right ovary. The left ovary is unremarkable. Small amount of free fluid noted in the pelvis. IMPRESSION: 1. Single intrauterine pregnancy with an estimated gestational age of [redacted] weeks, 5 days. No cardiac activity identified sign. Follow-up with ultrasound in 7-11 days, or  earlier if clinically indicated, recommended. 2. Right ovarian hemorrhagic cyst. Electronically Signed   By: Elgie Collard M.D.   On: 12/03/2019 03:40   US OB Transvaginal  Result Date: 12/03/2019 CLINICAL DATA:  24 year old pregnant female with abdominal pain. LMP: 10/31/2019 corresponding to an estimated gestational age of [redacted] weeks, 5 days. EXAM: OBSTETRIC <14 WK [place] AND TRANSVAGINAL OB US TECHNIQUE: Both transabdominal and transvaginal ultrasound examinations were performed for complete evaluation of the gestation as well as the maternal uterus, adnexal regions, and pelvic cul-de-sac. Transvaginal technique was performed to assess early pregnancy. COMPARISON:  None. FINDINGS: Intrauterine gestational sac: Single intrauterine gestational sac. Yolk sac:  Seen Embryo:  Present Cardiac Activity: Not detected. Heart Rate: N/A  bpm CRL:  2 mm   5 w   5 d                  [place] EDC: 07/30/2020 Subchorionic hemorrhage:  None visualized. Maternal uterus/adnexae: There is a 5.9 x 5.2 x 5.8 cm hemorrhagic cyst in the right ovary. The left ovary is unremarkable. Small amount of free fluid noted in the pelvis. IMPRESSION: 1. Single  intrauterine pregnancy with an estimated gestational age of [redacted] weeks, 5 days. No cardiac activity identified sign. Follow-up with ultrasound in 7-11 days, or earlier if clinically indicated, recommended. 2. Right ovarian hemorrhagic cyst. Electronically Signed   By: Elgie Collard M.D.   On: 12/03/2019 03:40   Meds ordered this encounter  Medications  . ondansetron (ZOFRAN) injection 4 mg  . sodium chloride 0.9 % bolus 1,000 mL  . potassium chloride 10 mEq in 100 mL IVPB  . prochlorperazine (COMPAZINE) injection 10 mg  . diphenhydrAMINE (BENADRYL) injection 12.5 mg  . diphenhydrAMINE (BENADRYL) injection 25 mg  . scopolamine (TRANSDERM-SCOP) 1 MG/3DAYS 1.5 mg  . haloperidol lactate (HALDOL) injection 5 mg  . potassium chloride 10 mEq in 100 mL IVPB    S/p two runs IV Potassium at [facility] prior to transfer  . 0.9 %  sodium chloride infusion  . scopolamine (TRANSDERM-SCOP) 1 MG/3DAYS    Sig: Place 1 patch (1.5 mg total) onto the skin every 3 (three) days for 10 doses.    Dispense:  10 patch    Refill:  0    Order Specific Question:   Supervising Provider    Answer:   Despina Hidden, LUTHER H [[id number]]  . famotidine (PEPCID) 10 MG tablet    Sig: Take 1 tablet (10 mg total) by mouth 2 (two) times daily.    Dispense:  60 tablet    Refill:  0    Order Specific Question:   Supervising Provider    Answer:   Lazaro Arms [[id number]]    Assessment and Plan  --24 y.o. G1P0 with SIUP at [redacted]w[redacted]d  --Hypokalemia in setting of recurrent vomiting --S/p 5 IV Potassium runs --Followup Potassium =3.8, outpatient PO K correction not indicated --Patient tolerating PO prior to discharge --Discharge home in stable condition  F/U: --IUP confirmed via ultrasound --Repeat scan to assess for cardiac activity scheduled for two weeks  Update:  --Patient was discharged home prior to receipt of updated Potassium results --She was reached at home via phone at 0720, notified of results, instructed that she does not need the PO  Potassium we originally discussed. Pt verbalized understanding  Calvert Cantor, CNM 12/03/2019, 7:35 AM

## 2019-12-03 NOTE — ED Notes (Signed)
Report given to [facility] and call to [facility] for report

## 2019-12-03 NOTE — ED Notes (Signed)
[facility] dispatch notified for need of transport.  

## 2019-12-03 NOTE — Discharge Instructions (Signed)
Cannabinoid Hyperemesis Syndrome Cannabinoid hyperemesis syndrome (CHS) is a condition that causes repeated nausea, vomiting, and abdominal pain after long-term (chronic) use of marijuana (cannabis). People with CHS typically use marijuana 3-5 times a day for many years before they have symptoms, although it is possible to develop CHS with as little as 1 use per day. Symptoms of CHS may be mild at first but can get worse and more frequent. In some cases, CHS may cause vomiting many times a day, which can lead to weight loss and dehydration. CHS may go away and come back many times (recur). People may not have symptoms or may otherwise be healthy in between CHS attacks. What are the causes? The exact cause of this condition is not known. Long-term use of marijuana may over-stimulate certain proteins in the brain that react with chemicals in marijuana (cannabinoid receptors). This over-stimulation may cause CHS. What are the signs or symptoms? Symptoms of this condition are often mild during the first few attacks, but they can get worse over time. Symptoms may include:  Frequent nausea, especially early in the morning.  Vomiting.  Abdominal pain. Taking several hot showers throughout the day can also be a sign of this condition. People with CHS may do this because it relieves symptoms. How is this diagnosed? This condition may be diagnosed based on:  Your symptoms and medical history, including any drug use.  A physical exam. You may have tests done to rule out other problems. These tests may include:  Blood tests.  Urine tests.  Imaging tests, such as an X-ray or CT scan. How is this treated? Treatment for this condition involves stopping marijuana use. Your health care provider may recommend:  A drug rehabilitation program, if you have trouble stopping marijuana use.  Medicines for nausea.  Hot showers to help relieve symptoms. Certain creams that contain a substance called  capsaicin may improve symptoms when applied to the abdomen. Ask your health care provider before starting any medicines or other treatments. Severe nausea and vomiting may require you to stay at the hospital. You may need IV fluids to prevent or treat dehydration. You may also need certain medicines that must be given at the hospital. Follow these instructions at home: During an attack   Stay in bed and rest in a dark, quiet room.  Take anti-nausea medicine as told by your health care provider.  Try taking hot showers to relieve your symptoms. After an attack  Drink small amounts of clear fluids slowly. Gradually add more.  Once you are able to eat without vomiting, eat soft foods in small amounts every 3-4 hours. General instructions   Do not use any products that contain marijuana.If you need help quitting, ask your health care provider for resources and treatment options.  Drink enough fluid to keep your urine pale yellow. Avoid drinking fluids that have a lot of sugar or caffeine, such as coffee and soda.  Take and apply over-the-counter and prescription medicines only as told by your health care provider. Ask your health care provider before starting any new medicines or treatments.  Keep all follow-up visits as told by your health care provider. This is important. Contact a health care provider if:  Your symptoms get worse.  You cannot drink fluids without vomiting.  You have pain and trouble swallowing after an attack. Get help right away if:  You cannot stop vomiting.  You have blood in your vomit or your vomit looks like coffee grounds.  You have   severe abdominal pain.  You have stools that are bloody or black, or stools that look like tar.  You have symptoms of dehydration, such as: ? Sunken eyes. ? Inability to make tears. ? Cracked lips. ? Dry mouth. ? Decreased urine production. ? Weakness. ? Sleepiness. ? Fainting. Summary  Cannabinoid hyperemesis  syndrome (CHS) is a condition that causes repeated nausea, vomiting, and abdominal pain after long-term use of marijuana.  People with CHS typically use marijuana 3-5 times a day for many years before they have symptoms, although it is possible to develop CHS with as little as 1 use per day.  Treatment for this condition involves stopping marijuana use. Hot showers and capsaicin creams may also help relieve symptoms. Ask your health care provider before starting any medicines or other treatments.  Your health care provider may prescribe medicines to help with nausea.  Get help right away if you have signs of dehydration, such as dry mouth, decreased urine production, or weakness. This information is not intended to replace advice given to you by your health care provider. Make sure you discuss any questions you have with your health care provider. Document Revised: 06/10/2017 Document Reviewed: 05/12/2016 Elsevier Patient Education  2020 Elsevier Inc.  

## 2019-12-03 NOTE — MAU Note (Signed)
Arrival via [facility] from [facility]. Presented there with positive preg test and nausea and vomiting. Mild upper abd pain. Denies vaginal bleeding.

## 2019-12-04 ENCOUNTER — Encounter (HOSPITAL_COMMUNITY): Payer: Self-pay | Admitting: Obstetrics & Gynecology

## 2019-12-04 ENCOUNTER — Inpatient Hospital Stay (HOSPITAL_COMMUNITY)
Admission: AD | Admit: 2019-12-04 | Discharge: 2019-12-04 | Disposition: A | Discharge status: Home / Self Care | Payer: 59 | Attending: Obstetrics & Gynecology | Admitting: Obstetrics & Gynecology

## 2019-12-04 ENCOUNTER — Other Ambulatory Visit (HOSPITAL_COMMUNITY): Payer: Self-pay

## 2019-12-04 ENCOUNTER — Other Ambulatory Visit: Payer: Self-pay

## 2019-12-04 DIAGNOSIS — K589 Irritable bowel syndrome without diarrhea: Secondary | ICD-10-CM | POA: Diagnosis not present

## 2019-12-04 DIAGNOSIS — O99611 Diseases of the digestive system complicating pregnancy, first trimester: Secondary | ICD-10-CM | POA: Diagnosis not present

## 2019-12-04 DIAGNOSIS — F419 Anxiety disorder, unspecified: Secondary | ICD-10-CM | POA: Insufficient documentation

## 2019-12-04 DIAGNOSIS — Z8719 Personal history of other diseases of the digestive system: Secondary | ICD-10-CM | POA: Insufficient documentation

## 2019-12-04 DIAGNOSIS — Z3A01 Less than 8 weeks gestation of pregnancy: Secondary | ICD-10-CM | POA: Insufficient documentation

## 2019-12-04 DIAGNOSIS — O99341 Other mental disorders complicating pregnancy, first trimester: Secondary | ICD-10-CM | POA: Diagnosis not present

## 2019-12-04 DIAGNOSIS — Z79899 Other long term (current) drug therapy: Secondary | ICD-10-CM | POA: Diagnosis not present

## 2019-12-04 DIAGNOSIS — F329 Major depressive disorder, single episode, unspecified: Secondary | ICD-10-CM | POA: Insufficient documentation

## 2019-12-04 DIAGNOSIS — O99321 Drug use complicating pregnancy, first trimester: Secondary | ICD-10-CM | POA: Insufficient documentation

## 2019-12-04 DIAGNOSIS — F1290 Cannabis use, unspecified, uncomplicated: Secondary | ICD-10-CM | POA: Diagnosis not present

## 2019-12-04 DIAGNOSIS — F12188 Cannabis abuse with other cannabis-induced disorder: Secondary | ICD-10-CM | POA: Diagnosis not present

## 2019-12-04 DIAGNOSIS — K219 Gastro-esophageal reflux disease without esophagitis: Secondary | ICD-10-CM | POA: Insufficient documentation

## 2019-12-04 DIAGNOSIS — O211 Hyperemesis gravidarum with metabolic disturbance: Secondary | ICD-10-CM | POA: Diagnosis not present

## 2019-12-04 DIAGNOSIS — O26891 Other specified pregnancy related conditions, first trimester: Secondary | ICD-10-CM | POA: Insufficient documentation

## 2019-12-04 DIAGNOSIS — R112 Nausea with vomiting, unspecified: Secondary | ICD-10-CM

## 2019-12-04 LAB — URINALYSIS, ROUTINE W REFLEX MICROSCOPIC
Bilirubin Urine: NEGATIVE
Glucose, UA: NEGATIVE mg/dL
Hgb urine dipstick: NEGATIVE
Ketones, ur: 20 mg/dL — AB
Leukocytes,Ua: NEGATIVE
Nitrite: NEGATIVE
Protein, ur: 30 mg/dL — AB
Specific Gravity, Urine: 1.027 (ref 1.005–1.030)
pH: 7 (ref 5.0–8.0)

## 2019-12-04 MED ORDER — LACTATED RINGERS IV BOLUS
1000.0000 mL | Freq: Once | INTRAVENOUS | Status: AC
  Administered 2019-12-04: 1000 mL via INTRAVENOUS

## 2019-12-04 MED ORDER — SCOPOLAMINE 1 MG/3DAYS TD PT72
1.0000 | MEDICATED_PATCH | Freq: Once | TRANSDERMAL | Status: DC
  Administered 2019-12-04: 1.5 mg via TRANSDERMAL
  Filled 2019-12-04: qty 1

## 2019-12-04 MED ORDER — HALOPERIDOL LACTATE 5 MG/ML IJ SOLN
5.0000 mg | Freq: Once | INTRAMUSCULAR | Status: AC
  Administered 2019-12-04: 5 mg via INTRAVENOUS
  Filled 2019-12-04: qty 1

## 2019-12-04 MED ORDER — DEXAMETHASONE SODIUM PHOSPHATE 10 MG/ML IJ SOLN
10.0000 mg | Freq: Once | INTRAMUSCULAR | Status: AC
  Administered 2019-12-04: 10 mg via INTRAVENOUS
  Filled 2019-12-04: qty 1

## 2019-12-04 MED ORDER — PROMETHAZINE HCL 25 MG RE SUPP: 25.0000 mg | Freq: Four times a day (QID) | RECTAL | 0 refills | Status: DC | PRN

## 2019-12-04 MED ORDER — METOCLOPRAMIDE HCL 5 MG/ML IJ SOLN
10.0000 mg | Freq: Once | INTRAMUSCULAR | Status: AC
  Administered 2019-12-04: 10 mg via INTRAVENOUS
  Filled 2019-12-04: qty 2

## 2019-12-04 MED ORDER — FAMOTIDINE IN NACL 20-0.9 MG/50ML-% IV SOLN
20.0000 mg | Freq: Once | INTRAVENOUS | Status: AC
  Administered 2019-12-04: 20 mg via INTRAVENOUS
  Filled 2019-12-04: qty 50

## 2019-12-04 MED ORDER — ENSURE ENLIVE PO LIQD
237.0000 mL | Freq: Once | ORAL | Status: AC
  Administered 2019-12-04: 237 mL via ORAL
  Filled 2019-12-04: qty 237

## 2019-12-04 MED ORDER — PROMETHAZINE HCL 25 MG/ML IJ SOLN
25.0000 mg | Freq: Once | INTRAMUSCULAR | Status: AC
  Administered 2019-12-04: 25 mg via INTRAVENOUS
  Filled 2019-12-04: qty 1

## 2019-12-04 MED ORDER — ONDANSETRON HCL 4 MG/2ML IJ SOLN
4.0000 mg | Freq: Once | INTRAMUSCULAR | Status: AC
  Administered 2019-12-04: 4 mg via INTRAVENOUS
  Filled 2019-12-04: qty 2

## 2019-12-04 MED FILL — PROMETHAZINE HCL 25 MG SUPP: 25 | 3 days supply | Qty: 12 | Fill #0

## 2019-12-04 NOTE — Discharge Instructions (Signed)
Hyperemesis Gravidarum Hyperemesis gravidarum is a severe form of nausea and vomiting that happens during pregnancy. Hyperemesis is worse than morning sickness. It may cause you to have nausea or vomiting all day for many days. It may keep you from eating and drinking enough food and liquids, which can lead to dehydration, malnutrition, and weight loss. Hyperemesis usually occurs during the first half (the first 20 weeks) of pregnancy. It often goes away once a woman is in her second half of pregnancy. However, sometimes hyperemesis continues through an entire pregnancy. What are the causes? The cause of this condition is not known. It may be related to changes in chemicals (hormones) in the body during pregnancy, such as the high level of pregnancy hormone (human chorionic gonadotropin) or the increase in the female sex hormone (estrogen). What are the signs or symptoms? Symptoms of this condition include:  Nausea that does not go away.  Vomiting that does not allow you to keep any food down.  Weight loss.  Body fluid loss (dehydration).  Having no desire to eat, or not liking food that you have previously enjoyed. How is this diagnosed? This condition may be diagnosed based on:  A physical exam.  Your medical history.  Your symptoms.  Blood tests.  Urine tests. How is this treated? This condition is managed by controlling symptoms. This may include:  Following an eating plan. This can help lessen nausea and vomiting.  Taking prescription medicines. An eating plan and medicines are often used together to help control symptoms. If medicines do not help relieve nausea and vomiting, you may need to receive fluids through an IV at the hospital. Follow these instructions at home: Eating and drinking   Avoid the following: ? Drinking fluids with meals. Try not to drink anything during the 30 minutes before and after your meals. ? Drinking more than 1 cup of fluid at a  time. ? Eating foods that trigger your symptoms. These may include spicy foods, coffee, high-fat foods, very sweet foods, and acidic foods. ? Skipping meals. Nausea can be more intense on an empty stomach. If you cannot tolerate food, do not force it. Try sucking on ice chips or other frozen items and make up for missed calories later. ? Lying down within 2 hours after eating. ? Being exposed to environmental triggers. These may include food smells, smoky rooms, closed spaces, rooms with strong smells, warm or humid places, overly loud and noisy rooms, and rooms with motion or flickering lights. Try eating meals in a well-ventilated area that is free of strong smells. ? Quick and sudden changes in your movement. ? Taking iron pills and multivitamins that contain iron. If you take prescription iron pills, do not stop taking them unless your health care provider approves. ? Preparing food. The smell of food can spoil your appetite or trigger nausea.  To help relieve your symptoms: ? Listen to your body. Everyone is different and has different preferences. Find what works best for you. ? Eat and drink slowly. ? Eat 5-6 small meals daily instead of 3 large meals. Eating small meals and snacks can help you avoid an empty stomach. ? In the morning, before getting out of bed, eat a couple of crackers to avoid moving around on an empty stomach. ? Try eating starchy foods as these are usually tolerated well. Examples include cereal, toast, bread, potatoes, pasta, rice, and pretzels. ? Include at least 1 serving of protein with your meals and snacks. Protein options include   lean meats, poultry, seafood, beans, nuts, nut butters, eggs, cheese, and yogurt. ? Try eating a protein-rich snack before bed. Examples of a protein-rick snack include cheese and crackers or a peanut butter sandwich made with 1 slice of whole-wheat bread and 1 tsp (5 g) of peanut butter. ? Eat or suck on things that have ginger in them.  It may help relieve nausea. Add  tsp ground ginger to hot tea or choose ginger tea. ? Try drinking 100% fruit juice or an electrolyte drink. An electrolyte drink contains sodium, potassium, and chloride. ? Drink fluids that are cold, clear, and carbonated or sour. Examples include lemonade, ginger ale, lemon-lime soda, ice water, and sparkling water. ? Brush your teeth or use a mouth rinse after meals. ? Talk with your health care provider about starting a supplement of vitamin B6. General instructions  Take over-the-counter and prescription medicines only as told by your health care provider.  Follow instructions from your health care provider about eating or drinking restrictions.  Continue to take your prenatal vitamins as told by your health care provider. If you are having trouble taking your prenatal vitamins, talk with your health care provider about different options.  Keep all follow-up and pre-birth (prenatal) visits as told by your health care provider. This is important. Contact a health care provider if:  You have pain in your abdomen.  You have a severe headache.  You have vision problems.  You are losing weight.  You feel weak or dizzy. Get help right away if:  You cannot drink fluids without vomiting.  You vomit blood.  You have constant nausea and vomiting.  You are very weak.  You faint.  You have a fever and your symptoms suddenly get worse. Summary  Hyperemesis gravidarum is a severe form of nausea and vomiting that happens during pregnancy.  Making some changes to your eating habits may help relieve nausea and vomiting.  This condition may be managed with medicine.  If medicines do not help relieve nausea and vomiting, you may need to receive fluids through an IV at the hospital. This information is not intended to replace advice given to you by your health care provider. Make sure you discuss any questions you have with your health care  provider. Document Revised: 02/21/2017 Document Reviewed: 10/01/2015 Elsevier Patient Education  2020 Elsevier Inc.  

## 2019-12-04 NOTE — MAU Note (Signed)
Diana Thomas is a 24 y.o. at [redacted]w[redacted]d here in MAU reporting: has not been able to eat or drink anything since last Wednesday. Reports multiple episodes of vomiting. Took phenergan and zofran at 8 this morning. Also having upper abdominal pain. No vaginal bleeding or discharge. Endorses marijuana use, last time was last night.  Onset of complaint: ongoing  Pain score: 7/10  Vitals:   12/04/19 0912  BP: (!) 145/80  Pulse: 70  Resp: 18  Temp: 98.2 F (36.8 C)     Lab orders placed from triage: UA

## 2019-12-04 NOTE — MAU Provider Note (Addendum)
History     CSN: [id number]  Arrival date and time: 12/04/19 [id number]   First Provider Initiated Contact with Patient 12/04/19 [phone]      Chief Complaint  Patient presents with  . Abdominal Pain  . Nausea  . Emesis   HPI  Diana Thomas is a 24 y.o. G1P0 at 1729w5d (per transvaginal US performed 12/03/2019) who presents to MAU for ongoing management of nausea, hyperemesis and upper abdominal pain. Patient was seen yesterday for similar symtpoms and was given 5 IV potassium runs  for hypokalemia and 1L bolus of NS. Patient had an oral challenge of water which she reported that she vomited up as soon as she got home. Patient endorses severe vomiting x 5 days and states she is not able to keep anything down since last Wednesday (11/28/2019).  She reports that she has combination IBS prescriptions for Zofran and Phenergan. She has had vomiting like this in the past but symptoms usually resolve within 2 days. She states her mother and grandmother both suffered from hyperemesis gravidarum.  Patient states that along with her vomiting, she has had significant abdominal pain localized to the epigastric region. She reports a history of GERD that she usually takes protonix for but has not been able to successfully take this medication due to the vomiting. Patient also reports marijuana use and states she last used last night 12/03/2019.   She denies lower abdominal pain, vaginal bleeding, dysuria, fever or recent illness.    OB History    Gravida  1   Para      Term      Preterm      AB      Living        SAB      TAB      Ectopic      Multiple      Live Births              Past Medical History:  Diagnosis Date  . Allergy   . Anemia   . Anxiety   . Depression   . Gastritis   . GERD (gastroesophageal reflux disease)   . Heart murmur     Past Surgical History:  Procedure Laterality Date  . NO PAST SURGERIES      Family History  Problem Relation Age of Onset  . Heart  disease Mother   . Hypertension Mother   . Hyperlipidemia Mother   . ADD / ADHD Father   . Healthy Father   . Healthy Sister   . Liver disease Maternal Grandmother   . Diabetes Maternal Uncle   . Colon cancer Neg Hx   . Esophageal cancer Neg Hx   . Stomach cancer Neg Hx   . Rectal cancer Neg Hx     Social History   Tobacco Use  . Smoking status: Never Smoker  . Smokeless tobacco: Never Used  Vaping Use  . Vaping Use: Never used  Substance Use Topics  . Alcohol use: Not Currently    Comment: occasional drinker  . Drug use: Yes    Types: Marijuana    Comment: 12/03/19    Allergies: No Known Allergies  Medications Prior to Admission  Medication Sig Dispense Refill Last Dose  . busPIRone (BUSPAR) 15 MG tablet Take 1 tablet (15 mg total) by mouth at bedtime. 90 tablet 3   . dicyclomine (BENTYL) 20 MG tablet Take 1 tablet (20 mg total) by mouth every 8 (eight) hours as needed  for spasms (Abdominal cramping). 15 tablet 0   . escitalopram (LEXAPRO) 20 MG tablet Take 1 tablet (20 mg total) by mouth at bedtime. 90 tablet 1   . famotidine (PEPCID) 10 MG tablet Take 1 tablet (10 mg total) by mouth 2 (two) times daily. 60 tablet 0   . ondansetron (ZOFRAN ODT) 8 MG disintegrating tablet Take 1 tablet (8 mg total) by mouth every 8 (eight) hours as needed for nausea or vomiting. 20 tablet 3   . pantoprazole (PROTONIX) 40 MG tablet Take 1-2 tablets (40-80 mg total) by mouth daily. (Patient taking differently: Take 40 mg by mouth 2 (two) times daily. ) 90 tablet 3   . promethazine (PHENERGAN) 25 MG tablet Take 1 tablet (25 mg total) by mouth every 6 (six) hours as needed for nausea or vomiting. 30 tablet 0   . QUEtiapine (SEROQUEL) 100 MG tablet TAKE 1 TABLET BY MOUTH AT BEDTIME 30 tablet 1   . [START ON 12/06/2019] scopolamine (TRANSDERM-SCOP) 1 MG/3DAYS Place 1 patch (1.5 mg total) onto the skin every 3 (three) days for 10 doses. 10 patch 0     Review of Systems  Constitutional: Positive  for appetite change and fatigue. Negative for chills, diaphoresis and fever.  Respiratory: Negative for cough and shortness of breath.   Cardiovascular: Negative for chest pain, palpitations and leg swelling.  Gastrointestinal: Positive for abdominal pain, nausea and vomiting. Negative for anal bleeding, blood in stool, constipation and diarrhea.  Genitourinary: Negative for difficulty urinating, dysuria, hematuria, vaginal bleeding, vaginal discharge and vaginal pain.  Musculoskeletal: Negative for back pain.  Skin: Negative for color change, pallor and rash.   Physical Exam   Blood pressure (!) 145/80, pulse 70, temperature 98.2 F (36.8 C), temperature source Oral, resp. rate 18, weight 72.8 kg, last menstrual period 10/31/2019.  Physical Exam Vitals reviewed.  Constitutional:      General: She is not in acute distress.    Appearance: She is ill-appearing. She is not diaphoretic.  HENT:     Head: Normocephalic.  Cardiovascular:     Rate and Rhythm: Normal rate and regular rhythm.     Heart sounds: Normal heart sounds.  Pulmonary:     Effort: Pulmonary effort is normal.     Breath sounds: Normal breath sounds. No stridor. No wheezing or rales.  Chest:     Chest wall: No tenderness.  Abdominal:     General: Bowel sounds are normal. There is no distension. There are no signs of injury.     Palpations: Abdomen is soft. There is no hepatomegaly or splenomegaly.     Tenderness: There is abdominal tenderness in the epigastric area and periumbilical area. There is guarding. There is no right CVA tenderness or left CVA tenderness.  Skin:    General: Skin is warm and dry.     Capillary Refill: Capillary refill takes less than 2 seconds.  Neurological:     General: No focal deficit present.     Mental Status: She is alert and oriented to person, place, and time.     MAU Course  MDM - Reviewed provider notes from last MAU visit and ED notes  -IV placed for fluid resuscitation and  medication administration -UA collected for dehydration status  -Patient reports THC use in last 24hrs, will treat for cannabis Hyperemesis   -Will provide counseling for THC use in pregnancy  Results for orders placed or performed during the hospital encounter of 12/02/19 (from the past 48 hour(s))  CBC with Differential     Status: Abnormal   Collection Time: 12/02/19 11:02 PM  Result Value Ref Range   WBC 17.2 (H) 4.0 - 10.5 K/uL   RBC 4.38 3.87 - 5.11 MIL/uL   Hemoglobin 12.6 12.0 - 15.0 g/dL   HCT 76.2 36 - 46 %   MCV 86.1 80.0 - 100.0 fL   MCH 28.8 26.0 - 34.0 pg   MCHC 33.4 30.0 - 36.0 g/dL   RDW 83.1 51.7 - 61.6 %   Platelets 271 150 - 400 K/uL   nRBC 0.0 0.0 - 0.2 %   Neutrophils Relative % 74 %   Neutro Abs 12.7 (H) 1.7 - 7.7 K/uL   Lymphocytes Relative 18 %   Lymphs Abs 3.2 0.7 - 4.0 K/uL   Monocytes Relative 7 %   Monocytes Absolute 1.2 (H) 0.1 - 1.0 K/uL   Eosinophils Relative 0 %   Eosinophils Absolute 0.0 0.0 - 0.5 K/uL   Basophils Relative 0 %   Basophils Absolute 0.0 0.0 - 0.1 K/uL   Immature Granulocytes 1 %   Abs Immature Granulocytes 0.10 (H) 0.00 - 0.07 K/uL    Comment: Performed at [facility], [address] [place], [place] [address]  Comprehensive metabolic panel     Status: Abnormal   Collection Time: 12/02/19 11:02 PM  Result Value Ref Range   Sodium 132 (L) 135 - 145 mmol/L   Potassium 2.5 (LL) 3.5 - 5.1 mmol/L    Comment: CRITICAL RESULT CALLED TO, READ BACK BY AND VERIFIED WITH: A.HODGES,RN [id number] @2338  BY V.WILKINS    Chloride 100 98 - 111 mmol/L   CO2 21 (L) 22 - 32 mmol/L   Glucose, Bld 135 (H) 70 - 99 mg/dL    Comment: Glucose reference range applies only to samples taken after fasting for at least 8 hours.   BUN 10 6 - 20 mg/dL   Creatinine, Ser 0.44 - 1.00 mg/dL   Calcium 8.8 (L) 8.9 - 10.3 mg/dL   Total Protein 7.9 6.5 - 8.1 g/dL   Albumin 4.5 3.5 - 5.0 g/dL   AST 20 15 - 41 U/L   ALT 20 0 - 44 U/L    Alkaline Phosphatase 47 38 - 126 U/L   Total Bilirubin 0.8 0.3 - 1.2 mg/dL   GFR, Estimated 8.54 >62 mL/min   Anion gap 11 5 - 15    Comment: Performed at [facility] [address] [place], [place]  Lipase, blood     Status: None   Collection Time: 12/02/19 11:02 PM  Result Value Ref Range   Lipase 25 11 - 51 U/L    Comment: Performed at [facility], [address], [place], [place]  hCG, quantitative, pregnancy     Status: Abnormal   Collection Time: 12/02/19 11:02 PM  Result Value Ref Range   hCG, Beta Chain, Quant, S 24,439 (H) <5 mIU/mL    Comment:          GEST. AGE      CONC.  (mIU/mL)   <=1 WEEK        5 - 50     2 WEEKS       50 - 500     3 WEEKS       100 - 10,000     4 WEEKS     1,000 - 30,000     5 WEEKS     3,500 -  115,000   6-8 WEEKS     12,000 - 270,000    12 WEEKS     15,000 - 220,000        FEMALE AND NON-PREGNANT FEMALE:     LESS THAN 5 mIU/mL Performed at [facility], [address] [place], [place] [address]   I-Stat Beta hCG blood, ED ([facility], [facility], [facility] only)     Status: Abnormal   Collection Time: 12/02/19 11:07 PM  Result Value Ref Range   I-stat hCG, quantitative >2,000.0 (H) <5 mIU/mL   Comment 3            Comment:   GEST. AGE      CONC.  (mIU/mL)   <=1 WEEK        5 - 50     2 WEEKS       50 - 500     3 WEEKS       100 - 10,000     4 WEEKS     1,000 - 30,000        FEMALE AND NON-PREGNANT FEMALE:     LESS THAN 5 mIU/mL   GC/Chlamydia probe amp ([place]) not at [facility]     Status: None   Collection Time: 12/02/19 11:21 PM  Result Value Ref Range   Chlamydia Negative    Neisseria Gonorrhea Negative    Comment Normal Reference Ranger Chlamydia - Negative    Comment      Normal Reference Range Neisseria Gonorrhea - Negative  Wet prep, genital     Status: Abnormal   Collection Time: 12/02/19 11:21 PM   Specimen: Vaginal  Result Value Ref Range   Yeast Wet Prep HPF POC  NONE SEEN NONE SEEN   Trich, Wet Prep NONE SEEN NONE SEEN   Clue Cells Wet Prep HPF POC PRESENT (A) NONE SEEN   WBC, Wet Prep HPF POC FEW (A) NONE SEEN   Sperm NONE SEEN     Comment: Performed at [facility], [address], [place], [place] [address]  Potassium     Status: None   Collection Time: 12/03/19  6:23 AM  Result Value Ref Range   Potassium 3.8 3.5 - 5.1 mmol/L    Comment: Performed at [facility], [address] [place], [place] [address]    Assessment and Plan   No diagnosis found.  24 year old  G1P0 at 6 weeks Hyperemesis-Cannabinoid Type  -We will place IV for fluid infusion of Lactated Ringers 1L bolus  -We will give  Decadron 10mg  and phenergan 25mg  IV for nausea symptomatic relief; will consider Haldol if patient does not improve on this treatment.  -We will give Pepcid 20 mg @100mL  for GERD relief  -We will give counseling on Marijuana use during pregnancy before discharge  - Patient will be given Oral challenge with Boost or Ensure prior to discharge.   PA-S 12/04/2019, 9:41 AM   Attestation of Supervision of Student:  I confirm that I have verified the information documented in the physician assistant student's note and that I have also personally reperformed the history, physical exam and all medical decision making activities.  I have verified that all services and findings are accurately documented in this student's note; and I agree with management and plan as outlined in the documentation. I have also made interventions and follow up as below.  Reassessment (12:03 PM) -Patient reports continued nausea despite above interventions. -Reglan 10mg  IV  ordered. -Nurse reports patient now c/o emesis. -Will give Zofran and reassess.   Reassessment (1:19 PM) -Provider at bedside. -Patient reports no relief and worsening of symptoms. Comfort given.  -Discussed expectation of N/V in pregnancy and that the goal is to  be functional, but N/V will occur. -Reviewed usage of MJ and educated on how this contributes to symptoms. -Patient expresses desire for termination siting inability to cope with N/V.  Support given. -Encouraged to discuss with family and friends if able. -Will give Haladol and boost if patient desires. -Patient informed that she will not be admitted for her symptoms. -Reviewed usage of medications OTC at home including phenergan (PO, PV, or Rectal). Script sent for phenergan suppositories. -Instructed to continue usage of Zofran if relief found.  -Nurse to bedside to give [facility]. -Patient requests cold soda for consumption.   Reassessment (2:07 PM)  -Patient continues to report no relief. -Will plan to discharge with precautions. -OOW until Oct 25 to allow for mgmt of symptoms -Dr. Marquis Lunch. Anyanwu updated on patient status, interventions, and current complaints. Agrees with POC. -Encouraged to call or return to MAU if symptoms worsen or with the onset of new symptoms. -Discharged to home in stable condition.  Cherre Robins MSN, CNM Advanced Practice Provider, [facility] [employer]

## 2019-12-11 ENCOUNTER — Other Ambulatory Visit: Payer: Self-pay

## 2019-12-11 ENCOUNTER — Inpatient Hospital Stay (HOSPITAL_COMMUNITY)
Admission: AD | Admit: 2019-12-11 | Discharge: 2019-12-17 | DRG: 833 | Disposition: A | Discharge status: Home / Self Care | Payer: 59 | Attending: Obstetrics and Gynecology | Admitting: Obstetrics and Gynecology

## 2019-12-11 DIAGNOSIS — F12188 Cannabis abuse with other cannabis-induced disorder: Secondary | ICD-10-CM | POA: Diagnosis present

## 2019-12-11 DIAGNOSIS — Z20822 Contact with and (suspected) exposure to covid-19: Secondary | ICD-10-CM | POA: Diagnosis not present

## 2019-12-11 DIAGNOSIS — O3680X0 Pregnancy with inconclusive fetal viability, not applicable or unspecified: Secondary | ICD-10-CM | POA: Diagnosis not present

## 2019-12-11 DIAGNOSIS — Z3A00 Weeks of gestation of pregnancy not specified: Secondary | ICD-10-CM | POA: Diagnosis not present

## 2019-12-11 DIAGNOSIS — Z3A08 8 weeks gestation of pregnancy: Secondary | ICD-10-CM | POA: Diagnosis not present

## 2019-12-11 DIAGNOSIS — Z3A01 Less than 8 weeks gestation of pregnancy: Secondary | ICD-10-CM

## 2019-12-11 DIAGNOSIS — O219 Vomiting of pregnancy, unspecified: Secondary | ICD-10-CM | POA: Diagnosis not present

## 2019-12-11 DIAGNOSIS — O99321 Drug use complicating pregnancy, first trimester: Secondary | ICD-10-CM | POA: Diagnosis not present

## 2019-12-11 DIAGNOSIS — R109 Unspecified abdominal pain: Secondary | ICD-10-CM | POA: Diagnosis not present

## 2019-12-11 DIAGNOSIS — N83291 Other ovarian cyst, right side: Secondary | ICD-10-CM | POA: Diagnosis not present

## 2019-12-11 DIAGNOSIS — O99891 Other specified diseases and conditions complicating pregnancy: Secondary | ICD-10-CM | POA: Diagnosis not present

## 2019-12-11 DIAGNOSIS — R112 Nausea with vomiting, unspecified: Secondary | ICD-10-CM | POA: Diagnosis present

## 2019-12-11 DIAGNOSIS — F1290 Cannabis use, unspecified, uncomplicated: Secondary | ICD-10-CM | POA: Diagnosis present

## 2019-12-11 DIAGNOSIS — O210 Mild hyperemesis gravidarum: Secondary | ICD-10-CM | POA: Diagnosis not present

## 2019-12-11 NOTE — MAU Note (Signed)
Pt reports she has not been able to keep anything down for 2 weeks, states she feels really weak and this afternoon she had an episode where it felt like her "heart was beating way too fast"

## 2019-12-12 ENCOUNTER — Encounter (HOSPITAL_COMMUNITY): Payer: Self-pay | Admitting: Obstetrics & Gynecology

## 2019-12-12 ENCOUNTER — Inpatient Hospital Stay (HOSPITAL_COMMUNITY): Payer: 59

## 2019-12-12 DIAGNOSIS — R112 Nausea with vomiting, unspecified: Secondary | ICD-10-CM | POA: Diagnosis present

## 2019-12-12 DIAGNOSIS — F1290 Cannabis use, unspecified, uncomplicated: Secondary | ICD-10-CM | POA: Diagnosis present

## 2019-12-12 LAB — GLUCOSE, CAPILLARY: Glucose-Capillary: 166 mg/dL — ABNORMAL HIGH (ref 70–99)

## 2019-12-12 LAB — URINALYSIS, ROUTINE W REFLEX MICROSCOPIC
Bilirubin Urine: NEGATIVE
Glucose, UA: NEGATIVE mg/dL
Hgb urine dipstick: NEGATIVE
Ketones, ur: 20 mg/dL — AB
Nitrite: NEGATIVE
Protein, ur: 30 mg/dL — AB
Specific Gravity, Urine: 1.019 (ref 1.005–1.030)
pH: 7 (ref 5.0–8.0)

## 2019-12-12 LAB — TYPE AND SCREEN
ABO/RH(D): A POS
Antibody Screen: NEGATIVE

## 2019-12-12 LAB — RAPID URINE DRUG SCREEN, HOSP PERFORMED
Amphetamines: NOT DETECTED
Barbiturates: NOT DETECTED
Benzodiazepines: NOT DETECTED
Cocaine: NOT DETECTED
Opiates: NOT DETECTED
Tetrahydrocannabinol: POSITIVE — AB

## 2019-12-12 LAB — COMPREHENSIVE METABOLIC PANEL
ALT: 13 U/L (ref 0–44)
AST: 20 U/L (ref 15–41)
Albumin: 3.9 g/dL (ref 3.5–5.0)
Alkaline Phosphatase: 56 U/L (ref 38–126)
Anion gap: 10 (ref 5–15)
BUN: 5 mg/dL — ABNORMAL LOW (ref 6–20)
CO2: 25 mmol/L (ref 22–32)
Calcium: 9.3 mg/dL (ref 8.9–10.3)
Chloride: 95 mmol/L — ABNORMAL LOW (ref 98–111)
Creatinine, Ser: 0.61 mg/dL (ref 0.44–1.00)
GFR, Estimated: >60 mL/min (ref >60)
Glucose, Bld: 104 mg/dL — ABNORMAL HIGH (ref 70–99)
Potassium: 3.4 mmol/L — ABNORMAL LOW (ref 3.5–5.1)
Sodium: 130 mmol/L — ABNORMAL LOW (ref 135–145)
Total Bilirubin: 0.7 mg/dL (ref 0.3–1.2)
Total Protein: 7.2 g/dL (ref 6.5–8.1)

## 2019-12-12 LAB — CBC WITH DIFFERENTIAL/PLATELET
Abs Immature Granulocytes: 0.08 10*3/uL — ABNORMAL HIGH (ref 0.00–0.07)
Basophils Absolute: 0.1 10*3/uL (ref 0.0–0.1)
Basophils Relative: 0 %
Eosinophils Absolute: 0.1 10*3/uL (ref 0.0–0.5)
Eosinophils Relative: 1 %
HCT: 37.1 % (ref 36.0–46.0)
Hemoglobin: 12.5 g/dL (ref 12.0–15.0)
Immature Granulocytes: 1 %
Lymphocytes Relative: 7 %
Lymphs Abs: 1.2 10*3/uL (ref 0.7–4.0)
MCH: 28.7 pg (ref 26.0–34.0)
MCHC: 33.7 g/dL (ref 30.0–36.0)
MCV: 85.3 fL (ref 80.0–100.0)
Monocytes Absolute: 1.1 10*3/uL — ABNORMAL HIGH (ref 0.1–1.0)
Monocytes Relative: 7 %
Neutro Abs: 13.9 10*3/uL — ABNORMAL HIGH (ref 1.7–7.7)
Neutrophils Relative %: 84 %
Platelets: 295 10*3/uL (ref 150–400)
RBC: 4.35 MIL/uL (ref 3.87–5.11)
RDW: 12.3 % (ref 11.5–15.5)
WBC: 16.4 10*3/uL — ABNORMAL HIGH (ref 4.0–10.5)
nRBC: 0 % (ref 0.0–0.2)

## 2019-12-12 LAB — RESPIRATORY PANEL BY RT PCR (FLU A&B, COVID)
Influenza A by PCR: NEGATIVE
Influenza B by PCR: NEGATIVE
SARS Coronavirus 2 by RT PCR: NEGATIVE

## 2019-12-12 LAB — MAGNESIUM: Magnesium: 2.1 mg/dL (ref 1.7–2.4)

## 2019-12-12 MED ORDER — METHYLPREDNISOLONE 16 MG PO TABS
16.0000 mg | ORAL_TABLET | Freq: Every day | ORAL | Status: DC
  Filled 2019-12-12: qty 1

## 2019-12-12 MED ORDER — METHYLPREDNISOLONE 4 MG PO TABS
8.0000 mg | ORAL_TABLET | Freq: Every day | ORAL | Status: DC
  Filled 2019-12-12: qty 2

## 2019-12-12 MED ORDER — DEXAMETHASONE SODIUM PHOSPHATE 10 MG/ML IJ SOLN
10.0000 mg | Freq: Once | INTRAMUSCULAR | Status: AC
  Administered 2019-12-12: 10 mg via INTRAVENOUS
  Filled 2019-12-12: qty 1

## 2019-12-12 MED ORDER — METHYLPREDNISOLONE 4 MG PO TABS: 4.0000 mg | ORAL_TABLET | Freq: Every day | ORAL | Status: DC

## 2019-12-12 MED ORDER — LACTATED RINGERS IV BOLUS
1000.0000 mL | Freq: Once | INTRAVENOUS | Status: AC
  Administered 2019-12-12: 1000 mL via INTRAVENOUS

## 2019-12-12 MED ORDER — DEXTROSE IN LACTATED RINGERS 5 % IV SOLN: INTRAVENOUS | Status: DC

## 2019-12-12 MED ORDER — BUSPIRONE HCL 15 MG PO TABS
15.0000 mg | ORAL_TABLET | Freq: Every day | ORAL | Status: DC
  Filled 2019-12-12: qty 1

## 2019-12-12 MED ORDER — PRENATAL MULTIVITAMIN CH: 1.0000 | ORAL_TABLET | Freq: Every day | ORAL | Status: DC

## 2019-12-12 MED ORDER — SODIUM CHLORIDE 0.9 % IV SOLN
25.0000 mg | Freq: Four times a day (QID) | INTRAVENOUS | Status: DC
  Filled 2019-12-12: qty 1

## 2019-12-12 MED ORDER — M.V.I. ADULT IV INJ
Freq: Once | INTRAVENOUS | Status: AC
  Filled 2019-12-12: qty 10

## 2019-12-12 MED ORDER — QUETIAPINE FUMARATE 100 MG PO TABS
100.0000 mg | ORAL_TABLET | Freq: Every day | ORAL | Status: DC
  Filled 2019-12-12: qty 1

## 2019-12-12 MED ORDER — ZOLPIDEM TARTRATE 5 MG PO TABS
5.0000 mg | ORAL_TABLET | Freq: Every evening | ORAL | Status: DC | PRN
  Filled 2019-12-12: qty 1

## 2019-12-12 MED ORDER — METHYLPREDNISOLONE 4 MG PO TABS: 8.0000 mg | ORAL_TABLET | Freq: Every day | ORAL | Status: DC

## 2019-12-12 MED ORDER — PROMETHAZINE HCL 25 MG/ML IJ SOLN
12.5000 mg | Freq: Once | INTRAMUSCULAR | Status: AC
  Administered 2019-12-12: 12.5 mg via INTRAVENOUS
  Filled 2019-12-12: qty 1

## 2019-12-12 MED ORDER — FAMOTIDINE 20 MG PO TABS
10.0000 mg | ORAL_TABLET | Freq: Two times a day (BID) | ORAL | Status: DC
  Administered 2019-12-12: 10 mg
  Filled 2019-12-12: qty 1

## 2019-12-12 MED ORDER — METHYLPREDNISOLONE 16 MG PO TABS
16.0000 mg | ORAL_TABLET | Freq: Every day | ORAL | Status: AC
  Administered 2019-12-14 – 2019-12-16 (×3): 16 mg via ORAL
  Filled 2019-12-12 (×4): qty 1

## 2019-12-12 MED ORDER — BUSPIRONE HCL 15 MG PO TABS
15.0000 mg | ORAL_TABLET | Freq: Every day | ORAL | Status: DC
  Administered 2019-12-12: 15 mg
  Filled 2019-12-12 (×2): qty 1

## 2019-12-12 MED ORDER — METHYLPREDNISOLONE SODIUM SUCC 125 MG IJ SOLR
48.0000 mg | Freq: Once | INTRAMUSCULAR | Status: AC
  Administered 2019-12-12: 48 mg via INTRAVENOUS
  Filled 2019-12-12: qty 2

## 2019-12-12 MED ORDER — CALCIUM CARBONATE ANTACID 500 MG PO CHEW: 2.0000 | CHEWABLE_TABLET | ORAL | Status: DC | PRN

## 2019-12-12 MED ORDER — QUETIAPINE FUMARATE 100 MG PO TABS
100.0000 mg | ORAL_TABLET | Freq: Every day | ORAL | Status: DC
  Administered 2019-12-12: 100 mg
  Filled 2019-12-12 (×2): qty 1

## 2019-12-12 MED ORDER — ACETAMINOPHEN 325 MG PO TABS: 650.0000 mg | ORAL_TABLET | ORAL | Status: DC | PRN

## 2019-12-12 MED ORDER — ESCITALOPRAM OXALATE 10 MG PO TABS
20.0000 mg | ORAL_TABLET | Freq: Every day | ORAL | Status: DC
  Administered 2019-12-12: 20 mg
  Filled 2019-12-12 (×2): qty 2

## 2019-12-12 MED ORDER — INFLUENZA VAC SPLIT QUAD 0.5 ML IM SUSY: 0.5000 mL | PREFILLED_SYRINGE | INTRAMUSCULAR | Status: DC | PRN

## 2019-12-12 MED ORDER — ESCITALOPRAM OXALATE 10 MG PO TABS: 20.0000 mg | ORAL_TABLET | Freq: Every day | ORAL | Status: DC

## 2019-12-12 MED ORDER — LACTATED RINGERS IV SOLN: INTRAVENOUS | Status: DC

## 2019-12-12 MED ORDER — OSMOLITE 1.2 CAL PO LIQD
1000.0000 mL | ORAL | Status: DC
  Administered 2019-12-12: 1000 mL
  Filled 2019-12-12 (×3): qty 1000

## 2019-12-12 MED ORDER — METHYLPREDNISOLONE 16 MG PO TABS: 16.0000 mg | ORAL_TABLET | Freq: Every day | ORAL | Status: DC

## 2019-12-12 MED ORDER — SODIUM CHLORIDE 0.9 % IV SOLN
25.0000 mg | Freq: Four times a day (QID) | INTRAVENOUS | Status: DC
  Administered 2019-12-12 – 2019-12-13 (×4): 25 mg via INTRAVENOUS
  Filled 2019-12-12 (×8): qty 1

## 2019-12-12 MED ORDER — M.V.I. ADULT IV INJ
INTRAVENOUS | Status: DC
  Filled 2019-12-12 (×7): qty 10

## 2019-12-12 MED ORDER — FAMOTIDINE 20 MG PO TABS: 10.0000 mg | ORAL_TABLET | Freq: Two times a day (BID) | ORAL | Status: DC

## 2019-12-12 MED ORDER — DOCUSATE SODIUM 100 MG PO CAPS
100.0000 mg | ORAL_CAPSULE | Freq: Every day | ORAL | Status: DC
  Filled 2019-12-12: qty 1

## 2019-12-12 MED ORDER — METOCLOPRAMIDE HCL 5 MG/ML IJ SOLN
10.0000 mg | Freq: Four times a day (QID) | INTRAMUSCULAR | Status: DC
  Administered 2019-12-12 (×4): 10 mg via INTRAVENOUS
  Filled 2019-12-12 (×6): qty 2

## 2019-12-12 MED ORDER — DICYCLOMINE HCL 20 MG PO TABS
20.0000 mg | ORAL_TABLET | Freq: Three times a day (TID) | ORAL | Status: DC | PRN
  Filled 2019-12-12: qty 1

## 2019-12-12 NOTE — MAU Provider Note (Signed)
History     CSN: [id number]  Arrival date and time: 12/11/19 2305   First Provider Initiated Contact with Patient 12/12/19 0050       Chief Complaint  Patient presents with  . Hyperemesis Gravidarum  . Chest Pain   Diana Thomas is a 24 y.o. G1P0 at [redacted]w[redacted]d who presents to MAU with complaints of HG and chest pain. Patient reports emesis 3+ times today (has only tried to eat those 3 times). Patient report flutters and chest pain is what brought her in, addition to weakness, patient reports losing 10 pounds over the course of 8 days. She reports she is taking zofran phenergan pepcid as prescribed. Patient has not been able to keep anything down except for small medications. Reports this includes water and liquids. Has been seen in MAU multiple times for this same complaint. Patient reports Last [facility] use today, patient reports showers and getting in the tub helps.    OB History    Gravida  1   Para      Term      Preterm      AB      Living        SAB      TAB      Ectopic      Multiple      Live Births              Past Medical History:  Diagnosis Date  . Allergy   . Anemia   . Anxiety   . Depression   . Gastritis   . GERD (gastroesophageal reflux disease)   . Heart murmur     Past Surgical History:  Procedure Laterality Date  . NO PAST SURGERIES      Family History  Problem Relation Age of Onset  . Heart disease Mother   . Hypertension Mother   . Hyperlipidemia Mother   . ADD / ADHD Father   . Healthy Father   . Healthy Sister   . Liver disease Maternal Grandmother   . Diabetes Maternal Uncle   . Colon cancer Neg Hx   . Esophageal cancer Neg Hx   . Stomach cancer Neg Hx   . Rectal cancer Neg Hx     Social History   Tobacco Use  . Smoking status: Never Smoker  . Smokeless tobacco: Never Used  Vaping Use  . Vaping Use: Never used  Substance Use Topics  . Alcohol use: Not Currently    Comment: occasional drinker  . Drug use: Yes     Types: Marijuana    Comment: 12/03/19    Allergies: No Known Allergies  Medications Prior to Admission  Medication Sig Dispense Refill Last Dose  . busPIRone (BUSPAR) 15 MG tablet Take 1 tablet (15 mg total) by mouth at bedtime. 90 tablet 3 Past Week at Unknown time  . dicyclomine (BENTYL) 20 MG tablet Take 1 tablet (20 mg total) by mouth every 8 (eight) hours as needed for spasms (Abdominal cramping). 15 tablet 0 12/11/2019 at Unknown time  . escitalopram (LEXAPRO) 20 MG tablet Take 1 tablet (20 mg total) by mouth at bedtime. 90 tablet 1 Past Week at Unknown time  . famotidine (PEPCID) 10 MG tablet Take 1 tablet (10 mg total) by mouth 2 (two) times daily. 60 tablet 0 12/11/2019 at Unknown time  . ondansetron (ZOFRAN ODT) 8 MG disintegrating tablet Take 1 tablet (8 mg total) by mouth every 8 (eight) hours as needed for nausea or  vomiting. 20 tablet 3 12/11/2019 at Unknown time  . promethazine (PHENERGAN) 25 MG tablet Take 1 tablet (25 mg total) by mouth every 6 (six) hours as needed for nausea or vomiting. 30 tablet 0 12/10/2019 at Unknown time  . QUEtiapine (SEROQUEL) 100 MG tablet TAKE 1 TABLET BY MOUTH AT BEDTIME 30 tablet 1 12/10/2019 at Unknown time  . scopolamine (TRANSDERM-SCOP) 1 MG/3DAYS Place 1 patch (1.5 mg total) onto the skin every 3 (three) days for 10 doses. 10 patch 0 12/11/2019 at Unknown time  . pantoprazole (PROTONIX) 40 MG tablet Take 1-2 tablets (40-80 mg total) by mouth daily. (Patient taking differently: Take 40 mg by mouth 2 (two) times daily. ) 90 tablet 3 Unknown at Unknown time  . promethazine (PHENERGAN) 25 MG suppository Place 1 suppository (25 mg total) rectally every 6 (six) hours as needed for nausea or vomiting. 12 each 0     Review of Systems  Constitutional: Negative.   Respiratory: Negative.   Cardiovascular: Positive for chest pain and palpitations.  Gastrointestinal: Positive for nausea and vomiting. Negative for constipation and diarrhea.   Genitourinary: Negative.   Musculoskeletal: Negative.   Neurological: Negative.   Psychiatric/Behavioral: Negative.    Physical Exam   Blood pressure 132/76, pulse 77, temperature 98.4 F (36.9 C), temperature source Oral, resp. rate 18, height 5\' 5"  (1.651 m), weight 69.9 kg, last menstrual period 10/31/2019, SpO2 100 %.  Physical Exam Vitals and nursing note reviewed.  Constitutional:      Appearance: She is ill-appearing.     Comments: Patient gagging and writhing around in bed unable to get comfortable   HENT:     Head: Normocephalic.  Cardiovascular:     Rate and Rhythm: Normal rate and regular rhythm.     Heart sounds: Normal heart sounds.  Pulmonary:     Effort: Pulmonary effort is normal.     Breath sounds: Normal breath sounds. No decreased breath sounds.  Abdominal:     Palpations: Abdomen is soft. There is no mass.     Tenderness: There is no abdominal tenderness. There is no guarding.  Musculoskeletal:     Right lower leg: No edema.     Left lower leg: No edema.  Skin:    General: Skin is warm and dry.  Neurological:     Mental Status: She is alert and oriented to person, place, and time.  Psychiatric:        Mood and Affect: Mood normal.        Behavior: Behavior normal.    MAU Course  Procedures  MDM  69.9kg today, 72.8kg on 10/19 Weight loss of 7lbs over the course of 1 week    Orders Placed This Encounter  Procedures  . Culture, OB Urine  . 11/19 OB Transvaginal  . Urinalysis, Routine w reflex microscopic Urine, Clean Catch  . CBC with Differential/Platelet  . Comprehensive metabolic panel  . Urine rapid drug screen (hosp performed)  . Magnesium  . ED EKG  . Insert peripheral IV   Meds ordered this encounter  Medications  . lactated ringers bolus 1,000 mL  . promethazine (PHENERGAN) injection 12.5 mg  . dexamethasone (DECADRON) injection 10 mg  . multivitamins adult (INFUVITE ADULT) 10 mL in lactated ringers 1,000 mL infusion    Treatments in MAU included IV LR bolus, phenergan, decadron and MVI  EKG showed NSR Reassessment after medication, patient reports feeling the same, unable to keep crackers down. Patient able to eat ice chips.  CBC  showed elevated WBC and neutros- ?pyelo. Patient denies back pain and no CVA tenderness on examination   Consult with Dr Mayford Knife on assessment and management. Due to patient's weight loss and unable to keep down solid foods recommends admission to [facility] Speciality care. Recommended obtaining magnesium level. Recommends scheduled reglan and phenergan piggy back. Also MVI with D5LR. Orders for admission placed.   Follow up US for viability performed prior to transfer to [facility] Speciality care US OB Transvaginal  Result Date: 12/12/2019 CLINICAL DATA:  Inconclusive viability EXAM: OBSTETRIC <14 WK [place] AND TRANSVAGINAL OB US TECHNIQUE: Both transabdominal and transvaginal ultrasound examinations were performed for complete evaluation of the gestation as well as the maternal uterus, adnexal regions, and pelvic cul-de-sac. Transvaginal technique was performed to assess early pregnancy. COMPARISON:  None. FINDINGS: Intrauterine gestational sac: Single Yolk sac:  Visualized. Embryo:  Visualized. Cardiac Activity: Visualized. Heart Rate: 123 bpm CRL:  6.9 mm   6 w   3 d                  [place] EDC: 08/03/2020 Subchorionic hemorrhage:  None visualized. Maternal uterus/adnexae: Again noted within the right ovary is a complex hemorrhagic cyst measuring 5.0 x 6.3 x 6.1 cm. The left ovary is normal in appearance. IMPRESSION: Single live intrauterine pregnancy measuring 6 weeks 3 days Slight interval growth in the complex hemorrhagic cyst within the right ovary. Electronically Signed   By: Jonna Clark M.D.   On: 12/12/2019 03:56  Normal IUP with HR of 123.   Assessment and Plan  1. Cannabinoid Hyperemesis   Admit to [facility] Speciality care Orders for admission placed   Sharyon Cable CNM 12/12/2019, 5:42 AM

## 2019-12-12 NOTE — Procedures (Signed)
Cortrak  Person Inserting Tube:  Atha Muradyan C, RD Tube Type:  Cortrak - 43 inches Tube Location:  Left nare Initial Placement:  Stomach Secured by: Bridle Technique Used to Measure Tube Placement:  Documented cm marking at nare/ corner of mouth Cortrak Secured At:  64 cm    Cortrak Tube Team Note:  Consult received to place a Cortrak feeding tube.   No x-ray is required. RN may begin using tube.   If the tube becomes dislodged please keep the tube and contact the Cortrak team at [email or web address] (password [email or web address]) for replacement.  If after hours and replacement cannot be delayed, place a NG tube and confirm placement with an abdominal x-ray.    Barbarajean Kinzler P., RD, LDN, CNSC See AMiON for contact information    

## 2019-12-12 NOTE — H&P (Signed)
Attestation signed by Malachy Chamber, MD at 12/12/2019 7:02 AM  Attestation of Attending Supervision of Advanced Practice Provider (PA/CNM/NP): Evaluation and management procedures were performed by the Advanced Practice Provider under my supervision and collaboration.  I have reviewed the Advanced Practice Provider's note and chart, and I agree with the management and plan. I have also made any necessary editorial changes.   Malachy Chamber, MD Attending Obstetrician & Gynecologist, [facility] for [facility], [facility] 12/12/2019 7:02 AM      Show:Clear all [x] Manual[x] Template[x] Copied  Added by: [x] , CNM  [] Hover for details  History   CSN:  Arrival date and time: 12/11/19 2305   First Provider Initiated Contact with Patient 12/12/19 0050          Chief Complaint  Patient presents with  . Hyperemesis Gravidarum  . Chest Pain   Diana Thomas is a 24 y.o. G1P0 at [redacted]w[redacted]d who presents to MAU with complaints of HG and chest pain. Patient reports emesis 3+ times today (has only tried to eat those 3 times). Patient report flutters and chest pain is what brought her in, addition to weakness, patient reports losing 10 pounds over the course of 8 days. She reports she is taking zofran phenergan pepcid as prescribed. Patient has not been able to keep anything down except for small medications. Reports this includes water and liquids. Has been seen in MAU multiple times for this same complaint. Patient reports Last [facility] use today, patient reports showers and getting in the tub helps.            OB History    Gravida  1   Para      Term      Preterm      AB      Living        SAB      TAB      Ectopic      Multiple      Live Births              Past Medical History:  Diagnosis Date  . Allergy   . Anemia   . Anxiety   . Depression   . Gastritis   . GERD  (gastroesophageal reflux disease)   . Heart murmur     Past Surgical History:  Procedure Laterality Date  . NO PAST SURGERIES           Family History  Problem Relation Age of Onset  . Heart disease Mother   . Hypertension Mother   . Hyperlipidemia Mother   . ADD / ADHD Father   . Healthy Father   . Healthy Sister   . Liver disease Maternal Grandmother   . Diabetes Maternal Uncle   . Colon cancer Neg Hx   . Esophageal cancer Neg Hx   . Stomach cancer Neg Hx   . Rectal cancer Neg Hx     Social History        Tobacco Use  . Smoking status: Never Smoker  . Smokeless tobacco: Never Used  Vaping Use  . Vaping Use: Never used  Substance Use Topics  . Alcohol use: Not Currently    Comment: occasional drinker  . Drug use: Yes    Types: Marijuana    Comment: 12/03/19    Allergies: No Known Allergies         Medications Prior to Admission  Medication Sig Dispense Refill Last Dose  .  busPIRone (BUSPAR) 15 MG tablet Take 1 tablet (15 mg total) by mouth at bedtime. 90 tablet 3 Past Week at Unknown time  . dicyclomine (BENTYL) 20 MG tablet Take 1 tablet (20 mg total) by mouth every 8 (eight) hours as needed for spasms (Abdominal cramping). 15 tablet 0 12/11/2019 at Unknown time  . escitalopram (LEXAPRO) 20 MG tablet Take 1 tablet (20 mg total) by mouth at bedtime. 90 tablet 1 Past Week at Unknown time  . famotidine (PEPCID) 10 MG tablet Take 1 tablet (10 mg total) by mouth 2 (two) times daily. 60 tablet 0 12/11/2019 at Unknown time  . ondansetron (ZOFRAN ODT) 8 MG disintegrating tablet Take 1 tablet (8 mg total) by mouth every 8 (eight) hours as needed for nausea or vomiting. 20 tablet 3 12/11/2019 at Unknown time  . promethazine (PHENERGAN) 25 MG tablet Take 1 tablet (25 mg total) by mouth every 6 (six) hours as needed for nausea or vomiting. 30 tablet 0 12/10/2019 at Unknown time  . QUEtiapine (SEROQUEL) 100 MG tablet TAKE 1 TABLET BY MOUTH AT  BEDTIME 30 tablet 1 12/10/2019 at Unknown time  . scopolamine (TRANSDERM-SCOP) 1 MG/3DAYS Place 1 patch (1.5 mg total) onto the skin every 3 (three) days for 10 doses. 10 patch 0 12/11/2019 at Unknown time  . pantoprazole (PROTONIX) 40 MG tablet Take 1-2 tablets (40-80 mg total) by mouth daily. (Patient taking differently: Take 40 mg by mouth 2 (two) times daily. ) 90 tablet 3 Unknown at Unknown time  . promethazine (PHENERGAN) 25 MG suppository Place 1 suppository (25 mg total) rectally every 6 (six) hours as needed for nausea or vomiting. 12 each 0     Review of Systems  Constitutional: Negative.   Respiratory: Negative.   Cardiovascular: Positive for chest pain and palpitations.  Gastrointestinal: Positive for nausea and vomiting. Negative for constipation and diarrhea.  Genitourinary: Negative.   Musculoskeletal: Negative.   Neurological: Negative.   Psychiatric/Behavioral: Negative.    Physical Exam   Blood pressure 132/76, pulse 77, temperature 98.4 F (36.9 C), temperature source Oral, resp. rate 18, height 5\' 5"  (1.651 m), weight 69.9 kg, last menstrual period 10/31/2019, SpO2 100 %.  Physical Exam Vitals and nursing note reviewed.  Constitutional:      Appearance: She is ill-appearing.     Comments: Patient gagging and writhing around in bed unable to get comfortable   HENT:     Head: Normocephalic.  Cardiovascular:     Rate and Rhythm: Normal rate and regular rhythm.     Heart sounds: Normal heart sounds.  Pulmonary:     Effort: Pulmonary effort is normal.     Breath sounds: Normal breath sounds. No decreased breath sounds.  Abdominal:     Palpations: Abdomen is soft. There is no mass.     Tenderness: There is no abdominal tenderness. There is no guarding.  Musculoskeletal:     Right lower leg: No edema.     Left lower leg: No edema.  Skin:    General: Skin is warm and dry.  Neurological:     Mental Status: She is alert and oriented to person, place, and  time.  Psychiatric:        Mood and Affect: Mood normal.        Behavior: Behavior normal.    MAU Course  Procedures  MDM  69.9kg today, 72.8kg on 10/19 Weight loss of 7lbs over the course of 1 week       Orders Placed  This Encounter  Procedures  . Culture, OB Urine  . US OB Transvaginal  . Urinalysis, Routine w reflex microscopic Urine, Clean Catch  . CBC with Differential/Platelet  . Comprehensive metabolic panel  . Urine rapid drug screen (hosp performed)  . Magnesium  . ED EKG  . Insert peripheral IV      Meds ordered this encounter  Medications  . lactated ringers bolus 1,000 mL  . promethazine (PHENERGAN) injection 12.5 mg  . dexamethasone (DECADRON) injection 10 mg  . multivitamins adult (INFUVITE ADULT) 10 mL in lactated ringers 1,000 mL infusion   Treatments in MAU included IV LR bolus, phenergan, decadron and MVI  EKG showed NSR Reassessment after medication, patient reports feeling the same, unable to keep crackers down. Patient able to eat ice chips.  CBC showed elevated WBC and neutros- ?pyelo. Patient denies back pain and no CVA tenderness on examination   Consult with Dr Mayford Knife on assessment and management. Due to patient's weight loss and unable to keep down solid foods recommends admission to [facility] care. Recommended obtaining magnesium level. Recommends scheduled reglan and phenergan piggy back. Also MVI with D5LR. Orders for admission placed.   Follow up US for viability performed prior to transfer to [facility] care US OB Transvaginal  Result Date: 12/12/2019 CLINICAL DATA:  Inconclusive viability EXAM: OBSTETRIC <14 WK [place] AND TRANSVAGINAL OB US TECHNIQUE: Both transabdominal and transvaginal ultrasound examinations were performed for complete evaluation of the gestation as well as the maternal uterus, adnexal regions, and pelvic cul-de-sac. Transvaginal technique was performed to assess early pregnancy. COMPARISON:  None.  FINDINGS: Intrauterine gestational sac: Single Yolk sac:  Visualized. Embryo:  Visualized. Cardiac Activity: Visualized. Heart Rate: 123 bpm CRL:  6.9 mm   6 w   3 d                  [place] EDC: 08/03/2020 Subchorionic hemorrhage:  None visualized. Maternal uterus/adnexae: Again noted within the right ovary is a complex hemorrhagic cyst measuring 5.0 x 6.3 x 6.1 cm. The left ovary is normal in appearance. IMPRESSION: Single live intrauterine pregnancy measuring 6 weeks 3 days Slight interval growth in the complex hemorrhagic cyst within the right ovary. Electronically Signed   By: Jonna Clark M.D.   On: 12/12/2019 03:56  Normal IUP with HR of 123.   Assessment and Plan  1. Cannabinoid Hyperemesis   Admit to [facility] Speciality care Orders for admission placed   Sharyon Cable CNM 12/12/2019, 5:42 AM         Cosigned by: Malachy Chamber, MD at 12/12/2019 7:02 AM   Above note reviewed by me and copied for admission  Adam Phenix, MD 12/12/2019

## 2019-12-12 NOTE — Progress Notes (Signed)
Initial Nutrition Assessment  DOCUMENTATION CODES:  Not applicable  INTERVENTION:  C/L diet Initiate osmolite 1.2 at 20 ml/hr, advance by 10 ml/hr q 8 hours to a goal rate of 60 ml/hr CMP q day until goal rate of TF achieved to monitor electrolytes/phos  NUTRITION DIAGNOSIS:  Inadequate oral intake related to nausea, vomiting as evidenced by per patient/family report. GOAL:  Patient will meet greater than or equal to 90% of their needs, Weight gain MONITOR:  Weight trends, Labs  REASON FOR ASSESSMENT:  New TF   ASSESSMENT:  Adm at 7 1/7 weeks with hyperemesis. Pt has extensive history of gastritis w/ n/v that is documented in EPIC since 2019. Weight fluctuates with the highest wt of 74.8 kg on 05/06/19. Documented weight loss of 6 lbs since 10/19. Much of this would be resolved with hydration. Pt does report very minimal po intake for the past week Steroid taper initiated. On PNV orally and w/ MVI in D5 IVF at 125 ml/hr  Osmolite 1.2 at 60 ml/hr will provide 1728 Kcal and 80 g of protein, 1440 ml free water. IVF should be decreased with every increase of TF    Diet Order:   Diet Order            Diet clear liquid Room service appropriate? Yes; Fluid consistency: Thin  Diet effective now                EDUCATION NEEDS:   No education needs have been identified at this time  Skin:  Skin Assessment: Reviewed RN Assessment  Height:   Ht Readings from Last 1 Encounters:  12/11/19 5\' 5"  (1.651 m)    Weight:   Wt Readings from Last 1 Encounters:  12/11/19 69.9 kg    Ideal Body Weight:   125 lbs  BMI:  Body mass index is 25.63 kg/m.  Estimated Nutritional Needs:   Kcal:  1600-1800  Protein:  70-80 g  Fluid:  2 L    12/13/19 M.Elisabeth Cara LDN Neonatal Nutrition Support Specialist/RD III

## 2019-12-12 NOTE — Progress Notes (Signed)
Subjective: Patient reports nausea and vomiting.  She has not eaten for days  Objective: I have reviewed patient's vital signs, intake and output, medications and labs. Blood pressure (!) 114/58, pulse 85, temperature 98.4 F (36.9 C), temperature source Oral, resp. rate 18, height 5\' 5"  (1.651 m), weight 69.9 kg, last menstrual period 10/31/2019, SpO2 99 %.  General: alert and moderate distress GI: soft, non-tender; bowel sounds normal; no masses,  no organomegaly Extremities: extremities normal, atraumatic, no cyanosis or edema CMP Latest Ref Rng & Units 12/12/2019 12/03/2019 12/02/2019  Glucose 70 - 99 mg/dL 12/04/2019) - 580(D)  BUN 6 - 20 mg/dL 5(L) - 10  Creatinine 983(J - 1.00 mg/dL 8.25 - 0.53  Sodium 9.76 - 145 mmol/L 130(L) - 132(L)  Potassium 3.5 - 5.1 mmol/L 3.4(L) 3.8 2.5(LL)  Chloride 98 - 111 mmol/L 95(L) - 100  CO2 22 - 32 mmol/L 25 - 21(L)  Calcium 8.9 - 10.3 mg/dL 9.3 - 8.8(L)  Total Protein 6.5 - 8.1 g/dL 7.2 - 7.9  Total Bilirubin 0.3 - 1.2 mg/dL 0.7 - 0.8  Alkaline Phos 38 - 126 U/L 56 - 47  AST 15 - 41 U/L 20 - 20  ALT 0 - 44 U/L 13 - 20     Assessment/Plan: Hyperemesis gravidarum likely with component of cannabis use. Will add Medrol taper and patient accepts feeding tube placement today by [facility] team.   LOS: 0 days    734 12/12/2019, 11:37 AM

## 2019-12-13 DIAGNOSIS — Z3A00 Weeks of gestation of pregnancy not specified: Secondary | ICD-10-CM

## 2019-12-13 DIAGNOSIS — O219 Vomiting of pregnancy, unspecified: Secondary | ICD-10-CM | POA: Diagnosis not present

## 2019-12-13 DIAGNOSIS — Z3A01 Less than 8 weeks gestation of pregnancy: Secondary | ICD-10-CM | POA: Diagnosis not present

## 2019-12-13 DIAGNOSIS — O99891 Other specified diseases and conditions complicating pregnancy: Secondary | ICD-10-CM

## 2019-12-13 DIAGNOSIS — F12188 Cannabis abuse with other cannabis-induced disorder: Secondary | ICD-10-CM | POA: Diagnosis present

## 2019-12-13 DIAGNOSIS — R109 Unspecified abdominal pain: Secondary | ICD-10-CM

## 2019-12-13 DIAGNOSIS — O99321 Drug use complicating pregnancy, first trimester: Secondary | ICD-10-CM | POA: Diagnosis present

## 2019-12-13 DIAGNOSIS — Z3A08 8 weeks gestation of pregnancy: Secondary | ICD-10-CM | POA: Diagnosis not present

## 2019-12-13 DIAGNOSIS — Z20822 Contact with and (suspected) exposure to covid-19: Secondary | ICD-10-CM | POA: Diagnosis present

## 2019-12-13 DIAGNOSIS — O210 Mild hyperemesis gravidarum: Secondary | ICD-10-CM | POA: Diagnosis present

## 2019-12-13 LAB — GLUCOSE, CAPILLARY
Glucose-Capillary: 141 mg/dL — ABNORMAL HIGH (ref 70–99)
Glucose-Capillary: 135 mg/dL — ABNORMAL HIGH (ref 70–99)

## 2019-12-13 LAB — GLUCOSE, RANDOM: Glucose, Bld: 131 mg/dL — ABNORMAL HIGH (ref 70–99)

## 2019-12-13 LAB — CULTURE, OB URINE: Culture: >=100000 — AB

## 2019-12-13 MED ORDER — METHYLPREDNISOLONE SODIUM SUCC 40 MG IJ SOLR
16.0000 mg | Freq: Every day | INTRAMUSCULAR | Status: AC
  Administered 2019-12-13: 16 mg via INTRAVENOUS
  Filled 2019-12-13: qty 0.4

## 2019-12-13 MED ORDER — METHYLPREDNISOLONE 4 MG PO TABS: 4.0000 mg | ORAL_TABLET | Freq: Every day | ORAL | Status: DC

## 2019-12-13 MED ORDER — METHYLPREDNISOLONE 4 MG PO TABS
8.0000 mg | ORAL_TABLET | Freq: Every day | ORAL | Status: DC
  Administered 2019-12-16: 8 mg via ORAL
  Filled 2019-12-13 (×3): qty 2

## 2019-12-13 MED ORDER — METHYLPREDNISOLONE 16 MG PO TABS
16.0000 mg | ORAL_TABLET | Freq: Every day | ORAL | Status: AC
  Administered 2019-12-14: 16 mg via ORAL
  Filled 2019-12-13: qty 1

## 2019-12-13 MED ORDER — DIPHENHYDRAMINE HCL 50 MG/ML IJ SOLN
25.0000 mg | Freq: Every evening | INTRAMUSCULAR | Status: DC | PRN
  Administered 2019-12-14: 25 mg via INTRAVENOUS
  Filled 2019-12-13: qty 1

## 2019-12-13 MED ORDER — DIPHENHYDRAMINE HCL 50 MG/ML IJ SOLN: 25.0000 mg | Freq: Every evening | INTRAMUSCULAR | Status: DC | PRN

## 2019-12-13 MED ORDER — METHYLPREDNISOLONE 4 MG PO TABS
8.0000 mg | ORAL_TABLET | Freq: Every day | ORAL | Status: DC
  Filled 2019-12-13 (×2): qty 2

## 2019-12-13 MED ORDER — ZOLPIDEM TARTRATE 5 MG PO TABS
5.0000 mg | ORAL_TABLET | Freq: Every evening | ORAL | Status: DC | PRN
  Administered 2019-12-13: 5 mg

## 2019-12-13 MED ORDER — PROMETHAZINE HCL 25 MG/ML IJ SOLN
25.0000 mg | Freq: Four times a day (QID) | INTRAMUSCULAR | Status: DC
  Administered 2019-12-13 – 2019-12-17 (×17): 25 mg via INTRAVENOUS
  Filled 2019-12-13 (×17): qty 1

## 2019-12-13 MED ORDER — METHYLPREDNISOLONE 16 MG PO TABS
16.0000 mg | ORAL_TABLET | Freq: Every day | ORAL | Status: AC
  Administered 2019-12-14 – 2019-12-15 (×2): 16 mg via ORAL
  Filled 2019-12-13 (×2): qty 1

## 2019-12-13 MED ORDER — SODIUM CHLORIDE 0.9 % IV SOLN
10.0000 mg | Freq: Two times a day (BID) | INTRAVENOUS | Status: DC
  Administered 2019-12-13 – 2019-12-17 (×9): 10 mg via INTRAVENOUS
  Filled 2019-12-13 (×11): qty 1

## 2019-12-13 MED ORDER — PROCHLORPERAZINE EDISYLATE 10 MG/2ML IJ SOLN
10.0000 mg | Freq: Four times a day (QID) | INTRAMUSCULAR | Status: DC | PRN
  Administered 2019-12-13 – 2019-12-17 (×13): 10 mg via INTRAVENOUS
  Filled 2019-12-13 (×16): qty 2

## 2019-12-13 NOTE — Progress Notes (Addendum)
Patient tube feed changed from 20 mL/hr to 30 mL/hr at 0004 per order. At 0144, patient requested that tube feed be decreased due to increasing nausea and anxiety. Patient then asked for feed to be stopped.   Feeding pump delayed for 30 minutes and restarted at 20 mL/hr.  At 0300, RN called to patient room. Patient turned off tube feeding prior to RN arrival due to increased nausea.   At 0430, RN called to room as patient had dislodged feeding tube while vomiting. Tube removed and MD notified.   Quincy Simmonds, RN

## 2019-12-13 NOTE — Progress Notes (Signed)
Subjective: Patient reports nausea and vomiting. Abdominal pain. States Reglan is making her feel worse. We discussed changing her  IV phenergan to bolus, d/c po meds for now  Objective: I have reviewed patient's vital signs, intake and output, medications and labs.  General: alert, cooperative and mild distress Resp: effort normal GI: soft, non-tender; bowel sounds normal; no masses,  no organomegaly Extremities: extremities normal, atraumatic, no cyanosis or edema CBC    Component Value Date/Time   WBC 16.4 (H) 12/12/2019 0210   RBC 4.35 12/12/2019 0210   HGB 12.5 12/12/2019 0210   HCT 37.1 12/12/2019 0210   PLT 295 12/12/2019 0210   MCV 85.3 12/12/2019 0210   MCV 85.7 02/09/2018 1239   MCH 28.7 12/12/2019 0210   MCHC 33.7 12/12/2019 0210   RDW 12.3 12/12/2019 0210   LYMPHSABS 1.2 12/12/2019 0210   MONOABS 1.1 (H) 12/12/2019 0210   EOSABS 0.1 12/12/2019 0210   BASOSABS 0.1 12/12/2019 0210    Intake/Output Summary (Last 24 hours) at 12/13/2019 1113 Last data filed at 12/13/2019 0000 Gross per 24 hour  Intake 1923.25 ml  Output 1600 ml  Net 323.25 ml     Assessment/Plan: IV meds only, observe for progress with current mgmt as she did not tolerate tube feeding, reassess tomorrow     Scheryl Darter 12/13/2019, 11:06 AM

## 2019-12-14 LAB — GLUCOSE, RANDOM: Glucose, Bld: 98 mg/dL (ref 70–99)

## 2019-12-14 LAB — GLUCOSE, CAPILLARY
Glucose-Capillary: 105 mg/dL — ABNORMAL HIGH (ref 70–99)
Glucose-Capillary: 117 mg/dL — ABNORMAL HIGH (ref 70–99)
Glucose-Capillary: 105 mg/dL — ABNORMAL HIGH (ref 70–99)

## 2019-12-14 MED ORDER — QUETIAPINE FUMARATE 100 MG PO TABS
100.0000 mg | ORAL_TABLET | Freq: Every day | ORAL | Status: DC
  Administered 2019-12-14 – 2019-12-15 (×2): 100 mg via ORAL
  Filled 2019-12-14 (×4): qty 1

## 2019-12-14 MED ORDER — ESCITALOPRAM OXALATE 10 MG PO TABS
20.0000 mg | ORAL_TABLET | Freq: Every day | ORAL | Status: DC
  Administered 2019-12-14 – 2019-12-15 (×2): 20 mg via ORAL
  Filled 2019-12-14 (×3): qty 2

## 2019-12-14 MED ORDER — LORAZEPAM 2 MG/ML IJ SOLN
0.5000 mg | Freq: Four times a day (QID) | INTRAMUSCULAR | Status: DC | PRN
  Administered 2019-12-14 – 2019-12-17 (×7): 0.5 mg via INTRAVENOUS
  Filled 2019-12-14 (×7): qty 1

## 2019-12-14 MED ORDER — BUSPIRONE HCL 15 MG PO TABS
15.0000 mg | ORAL_TABLET | Freq: Every day | ORAL | Status: DC
  Administered 2019-12-14 – 2019-12-15 (×2): 15 mg via ORAL
  Filled 2019-12-14 (×4): qty 1

## 2019-12-14 MED ORDER — SCOPOLAMINE 1 MG/3DAYS TD PT72
1.0000 | MEDICATED_PATCH | TRANSDERMAL | Status: DC
  Administered 2019-12-14 – 2019-12-17 (×2): 1.5 mg via TRANSDERMAL
  Filled 2019-12-14 (×2): qty 1

## 2019-12-14 NOTE — Progress Notes (Signed)
Subjective:She get temporary relief after phenergan, compazine Patient reports nausea and vomiting.    Objective: I have reviewed patient's vital signs, intake and output, medications and labs.  General: alert and mild distress Resp: effort normal GI: soft, non-tender; bowel sounds normal; no masses,  no organomegaly Extremities: extremities normal, atraumatic, no cyanosis or edema   Assessment/Plan: Patient has scheduled outpatient termination in 4 days. She asked if she could try antianxiety medication and I added ativan prn  LOS: 1 day    Diana Thomas 12/14/2019, 9:46 AM

## 2019-12-15 LAB — GLUCOSE, CAPILLARY
Glucose-Capillary: 110 mg/dL — ABNORMAL HIGH (ref 70–99)
Glucose-Capillary: 112 mg/dL — ABNORMAL HIGH (ref 70–99)
Glucose-Capillary: 127 mg/dL — ABNORMAL HIGH (ref 70–99)
Glucose-Capillary: 96 mg/dL (ref 70–99)

## 2019-12-15 LAB — GLUCOSE, RANDOM: Glucose, Bld: 190 mg/dL — ABNORMAL HIGH (ref 70–99)

## 2019-12-16 DIAGNOSIS — Z3A01 Less than 8 weeks gestation of pregnancy: Secondary | ICD-10-CM

## 2019-12-16 DIAGNOSIS — O99321 Drug use complicating pregnancy, first trimester: Secondary | ICD-10-CM | POA: Diagnosis not present

## 2019-12-16 DIAGNOSIS — F12188 Cannabis abuse with other cannabis-induced disorder: Secondary | ICD-10-CM | POA: Diagnosis not present

## 2019-12-16 LAB — GLUCOSE, CAPILLARY
Glucose-Capillary: 106 mg/dL — ABNORMAL HIGH (ref 70–99)
Glucose-Capillary: 107 mg/dL — ABNORMAL HIGH (ref 70–99)
Glucose-Capillary: 110 mg/dL — ABNORMAL HIGH (ref 70–99)
Glucose-Capillary: 228 mg/dL — ABNORMAL HIGH (ref 70–99)

## 2019-12-16 NOTE — Progress Notes (Signed)
FACULTY PRACTICE ANTEPARTUM COMPREHENSIVE PROGRESS NOTE  Diana Thomas is a 24 y.o. G1P0 at [redacted]w[redacted]d who is admitted for HEG.  Estimated Date of Delivery: 07/29/20  Length of Stay:  3 Days. Admitted 12/11/2019  Subjective: NGT was removed overnight, she tolerated some shaved ice this morning. Still very anxious to eat/drink anything. Denies vaginal bleeding.   Vitals:  Blood pressure 127/75, pulse 91, temperature 98 F (36.7 C), temperature source Oral, resp. rate 15, height 5\' 5"  (1.651 m), weight 67.6 kg, last menstrual period 10/31/2019, SpO2 100 %. Physical Examination: CONSTITUTIONAL: Well-developed, well-nourished female in no acute distress.  HENT:  Normocephalic, atraumatic, External right and left ear normal. Oropharynx is clear and moist EYES: Conjunctivae and EOM are normal. Pupils are equal, round, and reactive to light. No scleral icterus.  NECK: Normal range of motion, supple, no masses SKIN: Skin is warm and dry. No rash noted. Not diaphoretic. No erythema. No pallor. NEUROLGIC: Alert and oriented to person, place, and time. Normal reflexes, muscle tone coordination. No cranial nerve deficit noted. PSYCHIATRIC: Normal mood and affect. Normal behavior. Normal judgment and thought content. CARDIOVASCULAR: Normal heart rate noted, regular rhythm RESPIRATORY: Effort and breath sounds normal, no problems with respiration noted MUSCULOSKELETAL: Normal range of motion. No edema and no tenderness. 2+ distal pulses. ABDOMEN: Soft, nontender, nondistended, gravid.  Fetal monitoring: FHR: 150 bpm  Results for orders placed or performed during the hospital encounter of 12/11/19 (from the past 48 hour(s))  Glucose, capillary     Status: Abnormal   Collection Time: 12/14/19  6:33 AM  Result Value Ref Range   Glucose-Capillary 105 (H) 70 - 99 mg/dL    Comment: Glucose reference range applies only to samples taken after fasting for at least 8 hours.  Glucose, fasting - daily while on  taper     Status: None   Collection Time: 12/14/19  6:50 AM  Result Value Ref Range   Glucose, Bld 98 70 - 99 mg/dL    Comment: Glucose reference range applies only to samples taken after fasting for at least 8 hours. Performed at [facility], [address], [place], [place]   Glucose, capillary     Status: Abnormal   Collection Time: 12/14/19 12:33 PM  Result Value Ref Range   Glucose-Capillary 117 (H) 70 - 99 mg/dL    Comment: Glucose reference range applies only to samples taken after fasting for at least 8 hours.  Glucose, capillary     Status: Abnormal   Collection Time: 12/14/19  6:32 PM  Result Value Ref Range   Glucose-Capillary 105 (H) 70 - 99 mg/dL    Comment: Glucose reference range applies only to samples taken after fasting for at least 8 hours.  Glucose, capillary     Status: Abnormal   Collection Time: 12/14/19 11:59 PM  Result Value Ref Range   Glucose-Capillary 112 (H) 70 - 99 mg/dL    Comment: Glucose reference range applies only to samples taken after fasting for at least 8 hours.  Glucose, fasting - daily while on taper     Status: Abnormal   Collection Time: 12/15/19  5:10 AM  Result Value Ref Range   Glucose, Bld 190 (H) 70 - 99 mg/dL    Comment: Glucose reference range applies only to samples taken after fasting for at least 8 hours. Performed at [facility], [address], [place], [place]   Glucose, capillary     Status: None  Collection Time: 12/15/19  7:14 AM  Result Value Ref Range   Glucose-Capillary 96 70 - 99 mg/dL    Comment: Glucose reference range applies only to samples taken after fasting for at least 8 hours.  Glucose, capillary     Status: Abnormal   Collection Time: 12/15/19 12:00 PM  Result Value Ref Range   Glucose-Capillary 127 (H) 70 - 99 mg/dL    Comment: Glucose reference range applies only to samples taken after fasting for at least 8 hours.  Glucose, capillary     Status: Abnormal   Collection  Time: 12/15/19  6:00 PM  Result Value Ref Range   Glucose-Capillary 110 (H) 70 - 99 mg/dL    Comment: Glucose reference range applies only to samples taken after fasting for at least 8 hours.    No results found.  Current scheduled medications . busPIRone  15 mg Oral QHS  . docusate sodium  100 mg Oral Daily  . escitalopram  20 mg Oral QHS  . methylPREDNISolone  16 mg Oral Q breakfast   Followed by  . [START ON 12/17/2019] methylPREDNISolone  8 mg Oral Q breakfast   Followed by  . [START ON 12/24/2019] methylPREDNISolone  4 mg Oral Q breakfast  . methylPREDNISolone  8 mg Oral Q1400   Followed by  . [START ON 12/18/2019] methylPREDNISolone  4 mg Oral Q1400  . methylPREDNISolone  8 mg Oral QHS   Followed by  . [START ON 12/19/2019] methylPREDNISolone  4 mg Oral QHS  . prenatal multivitamin  1 tablet Oral Q1200  . promethazine  25 mg Intravenous Q6H  . QUEtiapine  100 mg Oral QHS  . scopolamine  1 patch Transdermal Q72H    I have reviewed the patient's current medications.  ASSESSMENT: Active Problems:   Cannabinoid hyperemesis syndrome   PLAN: Pt will try to advance diet and try PO antiemetics tomorrow, if tolerated plan for D/c in tomorrow. Of note, pt has outpatient termination scheduled in a few days.   Raynelle Dick MD, FACOG Obstetrician & Gynecologist, [facility] for [employer], [facility]

## 2019-12-16 NOTE — Progress Notes (Addendum)
FACULTY PRACTICE ANTEPARTUM COMPREHENSIVE PROGRESS NOTE  Diana Thomas is a 24 y.o. G1P0 at [redacted]w[redacted]d who is admitted for HEG.  Estimated Date of Delivery: 07/29/20   Length of Stay: Admitted 12/11/2019  Subjective: Still unable to tolerate much oral intake.. Denies vaginal bleeding.   Vitals:  Blood pressure 138/79, pulse 89, temperature 98.3 F (36.8 C), temperature source Oral, resp. rate 17, height 5\' 5"  (1.651 m), weight 68.1 kg, last menstrual period 10/31/2019, SpO2 96 %. Physical Examination: CONSTITUTIONAL: Well-developed, well-nourished female in no acute distress.  HENT:  Normocephalic, atraumatic, External right and left ear normal. Oropharynx is clear and moist EYES: Conjunctivae and EOM are normal. Pupils are equal, round, and reactive to light. No scleral icterus.  NECK: Normal range of motion, supple, no masses SKIN: Skin is warm and dry. No rash noted. Not diaphoretic. No erythema. No pallor. NEUROLGIC: Alert and oriented to person, place, and time. Normal reflexes, muscle tone coordination. No cranial nerve deficit noted. PSYCHIATRIC: Normal mood and affect. Normal behavior. Normal judgment and thought content. CARDIOVASCULAR: Normal heart rate noted, regular rhythm RESPIRATORY: Effort and breath sounds normal, no problems with respiration noted MUSCULOSKELETAL: Normal range of motion. No edema and no tenderness. 2+ distal pulses. ABDOMEN: Soft, nontender, nondistended, gravid.  Fetal monitoring: FHR: 150 bpm  Results for orders placed or performed during the hospital encounter of 12/11/19 (from the past 48 hour(s))  Glucose, capillary     Status: Abnormal   Collection Time: 12/14/19 12:33 PM  Result Value Ref Range   Glucose-Capillary 117 (H) 70 - 99 mg/dL    Comment: Glucose reference range applies only to samples taken after fasting for at least 8 hours.  Glucose, capillary     Status: Abnormal   Collection Time: 12/14/19  6:32 PM  Result Value Ref Range    Glucose-Capillary 105 (H) 70 - 99 mg/dL    Comment: Glucose reference range applies only to samples taken after fasting for at least 8 hours.  Glucose, capillary     Status: Abnormal   Collection Time: 12/14/19 11:59 PM  Result Value Ref Range   Glucose-Capillary 112 (H) 70 - 99 mg/dL    Comment: Glucose reference range applies only to samples taken after fasting for at least 8 hours.  Glucose, fasting - daily while on taper     Status: Abnormal   Collection Time: 12/15/19  5:10 AM  Result Value Ref Range   Glucose, Bld 190 (H) 70 - 99 mg/dL    Comment: Glucose reference range applies only to samples taken after fasting for at least 8 hours. Performed at [facility], [address], [place], [place]   Glucose, capillary     Status: None   Collection Time: 12/15/19  7:14 AM  Result Value Ref Range   Glucose-Capillary 96 70 - 99 mg/dL    Comment: Glucose reference range applies only to samples taken after fasting for at least 8 hours.  Glucose, capillary     Status: Abnormal   Collection Time: 12/15/19 12:00 PM  Result Value Ref Range   Glucose-Capillary 127 (H) 70 - 99 mg/dL    Comment: Glucose reference range applies only to samples taken after fasting for at least 8 hours.  Glucose, capillary     Status: Abnormal   Collection Time: 12/15/19  6:00 PM  Result Value Ref Range   Glucose-Capillary 110 (H) 70 - 99 mg/dL    Comment: Glucose reference range applies only to samples taken  after fasting for at least 8 hours.  Glucose, capillary     Status: Abnormal   Collection Time: 12/16/19  5:44 AM  Result Value Ref Range   Glucose-Capillary 228 (H) 70 - 99 mg/dL    Comment: Glucose reference range applies only to samples taken after fasting for at least 8 hours.    No results found.  Current scheduled medications . busPIRone  15 mg Oral QHS  . docusate sodium  100 mg Oral Daily  . escitalopram  20 mg Oral QHS  . methylPREDNISolone  16 mg Oral Q breakfast    Followed by  . [START ON 12/17/2019] methylPREDNISolone  8 mg Oral Q breakfast   Followed by  . [START ON 12/24/2019] methylPREDNISolone  4 mg Oral Q breakfast  . methylPREDNISolone  8 mg Oral Q1400   Followed by  . [START ON 12/18/2019] methylPREDNISolone  4 mg Oral Q1400  . methylPREDNISolone  8 mg Oral QHS   Followed by  . [START ON 12/19/2019] methylPREDNISolone  4 mg Oral QHS  . prenatal multivitamin  1 tablet Oral Q1200  . promethazine  25 mg Intravenous Q6H  . QUEtiapine  100 mg Oral QHS  . scopolamine  1 patch Transdermal Q72H    I have reviewed the patient's current medications.  ASSESSMENT: Active Problems:   Cannabinoid hyperemesis syndrome   PLAN: Continue to advance diet and try to stagger frequency of antiemetics Refuses another NGT placement. Patient desires discharge to home tomorrow, has outpatient termination scheduled on 12/18/19.   Jaynie Collins MD, FACOG Obstetrician & Gynecologist, [facility] [employer], [facility]

## 2019-12-17 ENCOUNTER — Other Ambulatory Visit (HOSPITAL_COMMUNITY): Payer: Self-pay | Admitting: Obstetrics and Gynecology

## 2019-12-17 DIAGNOSIS — O210 Mild hyperemesis gravidarum: Secondary | ICD-10-CM

## 2019-12-17 DIAGNOSIS — F12188 Cannabis abuse with other cannabis-induced disorder: Secondary | ICD-10-CM | POA: Diagnosis not present

## 2019-12-17 DIAGNOSIS — O99321 Drug use complicating pregnancy, first trimester: Secondary | ICD-10-CM | POA: Diagnosis not present

## 2019-12-17 DIAGNOSIS — Z3A08 8 weeks gestation of pregnancy: Secondary | ICD-10-CM | POA: Diagnosis not present

## 2019-12-17 LAB — GLUCOSE, RANDOM: Glucose, Bld: 116 mg/dL — ABNORMAL HIGH (ref 70–99)

## 2019-12-17 LAB — GLUCOSE, CAPILLARY
Glucose-Capillary: 130 mg/dL — ABNORMAL HIGH (ref 70–99)
Glucose-Capillary: 94 mg/dL (ref 70–99)

## 2019-12-17 MED ORDER — PROMETHAZINE HCL 25 MG RE SUPP: 25.0000 mg | Freq: Four times a day (QID) | RECTAL | 3 refills | Status: DC | PRN

## 2019-12-17 MED ORDER — METHYLPREDNISOLONE 4 MG PO TABS: ORAL_TABLET | ORAL | 0 refills | Status: DC

## 2019-12-17 MED ORDER — HYDROXYZINE HCL 25 MG PO TABS: 25.0000 mg | ORAL_TABLET | Freq: Four times a day (QID) | ORAL | 0 refills | Status: DC | PRN

## 2019-12-17 MED ORDER — PROCHLORPERAZINE EDISYLATE 10 MG/2ML IJ SOLN
10.0000 mg | Freq: Four times a day (QID) | INTRAMUSCULAR | 0 refills | Status: DC | PRN
Start: 2019-12-17 — End: 2020-10-15

## 2019-12-17 MED FILL — hydrOXYzine HCL 25 MG TABS: 25 | 5 days supply | Qty: 30 | Fill #0

## 2019-12-17 MED FILL — METHYLPREDNISOLONE 4 MG TAB: 4 | 13 days supply | Qty: 27 | Fill #0

## 2019-12-17 MED FILL — PROMETHAZINE HCL 25 MG SUPP: 25 | 3 days supply | Qty: 15 | Fill #0

## 2019-12-17 NOTE — Discharge Summary (Signed)
Physician Discharge Summary  Patient ID: Diana Thomas MRN: [id number] DOB/AGE: August 30, 1995 24 y.o.  Admit date: 12/11/2019 Discharge date: 12/17/2019  Admission Diagnoses: hyperemesis gravidarum  Discharge Diagnoses:  Active Problems:   Cannabinoid hyperemesis syndrome   Discharged Condition: fair  Hospital Course: Patient admitted with hyperemesis gravidarum. Patient received IV hydration, antiemetics, steroid taper and a feeding tube which she self discontinued. Patient was able to tolerate clear liquids yesterday and this morning. Patient is scheduled for a termination of pregnancy tomorrow and desires to be discharged home today.  Consults: None  Discharge Exam: Blood pressure 138/84, pulse 95, temperature 97.8 F (36.6 C), temperature source Oral, resp. rate 18, height 5\' 5"  (1.651 m), weight 69.5 kg, last menstrual period 10/31/2019, SpO2 100 %. GENERAL: Well-developed, well-nourished female in no acute distress.  LUNGS: Clear to auscultation bilaterally.  HEART: Regular rate and rhythm. ABDOMEN: Soft, nontender, nondistended. No organomegaly. EXTREMITIES: No cyanosis, clubbing, or edema, 2+ distal pulses.   Disposition:  Discharge disposition: 01-Home or Self Care        Allergies as of 12/17/2019   No Known Allergies     Medication List    TAKE these medications   busPIRone 15 MG tablet Commonly known as: BUSPAR Take 1 tablet (15 mg total) by mouth at bedtime.   dicyclomine 20 MG tablet Commonly known as: BENTYL Take 1 tablet (20 mg total) by mouth every 8 (eight) hours as needed for spasms (Abdominal cramping).   escitalopram 20 MG tablet Commonly known as: LEXAPRO Take 1 tablet (20 mg total) by mouth at bedtime.   famotidine 10 MG tablet Commonly known as: PEPCID Take 1 tablet (10 mg total) by mouth 2 (two) times daily.   hydrOXYzine 25 MG tablet Commonly known as: ATARAX/VISTARIL Take 1 tablet (25 mg total) by mouth every 6 (six) hours as  needed for anxiety.   methylPREDNISolone 4 MG tablet Commonly known as: MEDROL Take as directed on steroid taper calendar   ondansetron 8 MG disintegrating tablet Commonly known as: Zofran ODT Take 1 tablet (8 mg total) by mouth every 8 (eight) hours as needed for nausea or vomiting.   pantoprazole 40 MG tablet Commonly known as: PROTONIX Take 1-2 tablets (40-80 mg total) by mouth daily. What changed:   how much to take  when to take this   prochlorperazine 10 MG/2ML injection Commonly known as: COMPAZINE Inject 2 mLs (10 mg total) into the vein every 6 (six) hours as needed.   promethazine 25 MG tablet Commonly known as: PHENERGAN Take 1 tablet (25 mg total) by mouth every 6 (six) hours as needed for nausea or vomiting.   promethazine 25 MG suppository Commonly known as: PHENERGAN Place 1 suppository (25 mg total) rectally every 6 (six) hours as needed for nausea or vomiting.   QUEtiapine 100 MG tablet Commonly known as: SEROQUEL TAKE 1 TABLET BY MOUTH AT BEDTIME   scopolamine 1 MG/3DAYS Commonly known as: TRANSDERM-SCOP Place 1 patch (1.5 mg total) onto the skin every 3 (three) days for 10 doses.       Follow-up Information    [facility] Follow up.   Specialty: Obstetrics and Gynecology Contact information: [address] [place] [phone]              Signed: [phone] 12/17/2019, 11:56 AM

## 2019-12-17 NOTE — Discharge Instructions (Signed)
Hyperemesis Gravidarum Hyperemesis gravidarum is a severe form of nausea and vomiting that happens during pregnancy. Hyperemesis is worse than morning sickness. It may cause you to have nausea or vomiting all day for many days. It may keep you from eating and drinking enough food and liquids, which can lead to dehydration, malnutrition, and weight loss. Hyperemesis usually occurs during the first half (the first 20 weeks) of pregnancy. It often goes away once a woman is in her second half of pregnancy. However, sometimes hyperemesis continues through an entire pregnancy. What are the causes? The cause of this condition is not known. It may be related to changes in chemicals (hormones) in the body during pregnancy, such as the high level of pregnancy hormone (human chorionic gonadotropin) or the increase in the female sex hormone (estrogen). What are the signs or symptoms? Symptoms of this condition include:  Nausea that does not go away.  Vomiting that does not allow you to keep any food down.  Weight loss.  Body fluid loss (dehydration).  Having no desire to eat, or not liking food that you have previously enjoyed. How is this diagnosed? This condition may be diagnosed based on:  A physical exam.  Your medical history.  Your symptoms.  Blood tests.  Urine tests. How is this treated? This condition is managed by controlling symptoms. This may include:  Following an eating plan. This can help lessen nausea and vomiting.  Taking prescription medicines. An eating plan and medicines are often used together to help control symptoms. If medicines do not help relieve nausea and vomiting, you may need to receive fluids through an IV at the hospital. Follow these instructions at home: Eating and drinking   Avoid the following: ? Drinking fluids with meals. Try not to drink anything during the 30 minutes before and after your meals. ? Drinking more than 1 cup of fluid at a  time. ? Eating foods that trigger your symptoms. These may include spicy foods, coffee, high-fat foods, very sweet foods, and acidic foods. ? Skipping meals. Nausea can be more intense on an empty stomach. If you cannot tolerate food, do not force it. Try sucking on ice chips or other frozen items and make up for missed calories later. ? Lying down within 2 hours after eating. ? Being exposed to environmental triggers. These may include food smells, smoky rooms, closed spaces, rooms with strong smells, warm or humid places, overly loud and noisy rooms, and rooms with motion or flickering lights. Try eating meals in a well-ventilated area that is free of strong smells. ? Quick and sudden changes in your movement. ? Taking iron pills and multivitamins that contain iron. If you take prescription iron pills, do not stop taking them unless your health care provider approves. ? Preparing food. The smell of food can spoil your appetite or trigger nausea.  To help relieve your symptoms: ? Listen to your body. Everyone is different and has different preferences. Find what works best for you. ? Eat and drink slowly. ? Eat 5-6 small meals daily instead of 3 large meals. Eating small meals and snacks can help you avoid an empty stomach. ? In the morning, before getting out of bed, eat a couple of crackers to avoid moving around on an empty stomach. ? Try eating starchy foods as these are usually tolerated well. Examples include cereal, toast, bread, potatoes, pasta, rice, and pretzels. ? Include at least 1 serving of protein with your meals and snacks. Protein options include   lean meats, poultry, seafood, beans, nuts, nut butters, eggs, cheese, and yogurt. ? Try eating a protein-rich snack before bed. Examples of a protein-rick snack include cheese and crackers or a peanut butter sandwich made with 1 slice of whole-wheat bread and 1 tsp (5 g) of peanut butter. ? Eat or suck on things that have ginger in them.  It may help relieve nausea. Add  tsp ground ginger to hot tea or choose ginger tea. ? Try drinking 100% fruit juice or an electrolyte drink. An electrolyte drink contains sodium, potassium, and chloride. ? Drink fluids that are cold, clear, and carbonated or sour. Examples include lemonade, ginger ale, lemon-lime soda, ice water, and sparkling water. ? Brush your teeth or use a mouth rinse after meals. ? Talk with your health care provider about starting a supplement of vitamin B6. General instructions  Take over-the-counter and prescription medicines only as told by your health care provider.  Follow instructions from your health care provider about eating or drinking restrictions.  Continue to take your prenatal vitamins as told by your health care provider. If you are having trouble taking your prenatal vitamins, talk with your health care provider about different options.  Keep all follow-up and pre-birth (prenatal) visits as told by your health care provider. This is important. Contact a health care provider if:  You have pain in your abdomen.  You have a severe headache.  You have vision problems.  You are losing weight.  You feel weak or dizzy. Get help right away if:  You cannot drink fluids without vomiting.  You vomit blood.  You have Icelyn Navarrete nausea and vomiting.  You are very weak.  You faint.  You have a fever and your symptoms suddenly get worse. Summary  Hyperemesis gravidarum is a severe form of nausea and vomiting that happens during pregnancy.  Making some changes to your eating habits may help relieve nausea and vomiting.  This condition may be managed with medicine.  If medicines do not help relieve nausea and vomiting, you may need to receive fluids through an IV at the hospital. This information is not intended to replace advice given to you by your health care provider. Make sure you discuss any questions you have with your health care  provider. Document Revised: 02/21/2017 Document Reviewed: 10/01/2015 Elsevier Patient Education  2020 [employer]  

## 2019-12-17 NOTE — Progress Notes (Signed)
Discharge instructions and prescriptions given to pt. Discussed signs and symptoms to report to the MD, upcoming appointments, and meds. Pt verbalizes understanding and has no questions or concerns at this time. Pt discharged from hospital in stable condition. 

## 2019-12-17 NOTE — Plan of Care (Signed)
MEDROL (METHYLPREDNISOLONE) TAPER  FOR HYPEREMESIS GRAVIDARUM PATIENTS  The following is a 14 day taper of methylprednisolone for hyperemesis. Doses on day 1  will be given IV.  All doses starting on day 2  will be given PO. (If patient cannot tolerate oral medications, contact the pharmacy to change route to IV.)   Date Day Morning Midday Bedtime  12/13/19 1 16  mg 16 mg 16 mg  12/14/19 2 16  mg 16 mg 16 mg  12/15/19 3 16  mg 8 mg 16 mg  12/16/19 4 16  mg 8 mg 8 mg  12/17/19 5 8  mg 8 mg 8 mg  12/18/19 6 8  mg 4 mg 8 mg  12/19/19 7 8  mg 4 mg 4 mg  12/20/19 8 8  mg 4 mg   12/21/19 9 8  mg 4 mg   12/22/19 10 8  mg    12/23/19 11 8  mg    12/24/19 12 4  mg    12/25/19 13 4  mg     Check fasting blood sugars daily while on the taper. Notify MD if fasting blood sugar>95.  Rosalva Ferron [facility] 12/17/2019

## 2019-12-19 MED FILL — ONDANSETRON ODT 8 MG TABLET: 8 | 6 days supply | Qty: 20 | Fill #2

## 2019-12-21 ENCOUNTER — Inpatient Hospital Stay: Payer: 59 | Admitting: Registered Nurse

## 2019-12-24 ENCOUNTER — Other Ambulatory Visit: Payer: Self-pay

## 2019-12-24 ENCOUNTER — Ambulatory Visit: Payer: 59 | Admitting: Registered Nurse

## 2019-12-24 ENCOUNTER — Encounter: Payer: Self-pay | Admitting: Registered Nurse

## 2019-12-24 VITALS — BP 132/87 | HR 86 | Temp 98.0°F | Resp 18 | Ht 65.0 in | Wt 154.8 lb

## 2019-12-24 DIAGNOSIS — F39 Unspecified mood [affective] disorder: Secondary | ICD-10-CM

## 2019-12-24 DIAGNOSIS — Z3009 Encounter for other general counseling and advice on contraception: Secondary | ICD-10-CM | POA: Diagnosis not present

## 2019-12-24 NOTE — Progress Notes (Signed)
Established Patient Office Visit  Subjective:  Patient ID: Diana Thomas, female    DOB: [birth year]  Age: 24 y.o. MRN: [id number]  CC:  Chief Complaint  Patient presents with  . Hospitalization Follow-up    hospital follow up  . Contraception    Patient states she would also like to discuss birth control.    HPI Diana Thomas presents for hospital follow up  Admitted twice in recent weeks for vomiting Hx of CVS - cannabinoid hyperemesis. Only uses marijuana occasionally, has had multiple negative UDS in previous 1-2 years. Uses this to self medicate for anxiety and depression. Notes that she will become constipated, then diarrhea sets in with start of nausea and vomiting. Gets epigastric pain and sweats as precursors. Epigastric pain will not resolve without vomiting.  Had been pregnant - since terminated. Feeling ok. Wants to start on bcm - interested in est with Gyn office for this - has not been est before. Interested in depo or nexplanon.   Otherwise feeling well.   Past Medical History:  Diagnosis Date  . Allergy   . Anemia   . Anxiety   . Depression   . Gastritis   . GERD (gastroesophageal reflux disease)   . Heart murmur     Past Surgical History:  Procedure Laterality Date  . NO PAST SURGERIES      Family History  Problem Relation Age of Onset  . Heart disease Mother   . Hypertension Mother   . Hyperlipidemia Mother   . ADD / ADHD Father   . Healthy Father   . Healthy Sister   . Liver disease Maternal Grandmother   . Diabetes Maternal Uncle   . Colon cancer Neg Hx   . Esophageal cancer Neg Hx   . Stomach cancer Neg Hx   . Rectal cancer Neg Hx     Social History   Socioeconomic History  . Marital status: Single    Spouse name: Not on file  . Number of children: 0  . Years of education: Not on file  . Highest education level: Not on file  Occupational History  . Occupation: Charity fundraiser  Tobacco Use  . Smoking status: Never Smoker  . Smokeless  tobacco: Never Used  Vaping Use  . Vaping Use: Never used  Substance and Sexual Activity  . Alcohol use: Not Currently    Comment: occasional drinker  . Drug use: Yes    Types: Marijuana    Comment: 12/03/19  . Sexual activity: Yes  Other Topics Concern  . Not on file  Social History Narrative  . Not on file   Social Determinants of Health   Financial Resource Strain:   . Difficulty of Paying Living Expenses: Not on file  Food Insecurity:   . Worried About Programme researcher, broadcasting/film/video in the Last Year: Not on file  . Ran Out of Food in the Last Year: Not on file  Transportation Needs:   . Lack of Transportation (Medical): Not on file  . Lack of Transportation (Non-Medical): Not on file  Physical Activity:   . Days of Exercise per Week: Not on file  . Minutes of Exercise per Session: Not on file  Stress:   . Feeling of Stress : Not on file  Social Connections:   . Frequency of Communication with Friends and Family: Not on file  . Frequency of Social Gatherings with Friends and Family: Not on file  . Attends Religious Services: Not on file  .  Active Member of Clubs or Organizations: Not on file  . Attends Banker Meetings: Not on file  . Marital Status: Not on file  Intimate Partner Violence:   . Fear of Current or Ex-Partner: Not on file  . Emotionally Abused: Not on file  . Physically Abused: Not on file  . Sexually Abused: Not on file    Outpatient Medications Prior to Visit  Medication Sig Dispense Refill  . busPIRone (BUSPAR) 15 MG tablet Take 1 tablet (15 mg total) by mouth at bedtime. 90 tablet 3  . dicyclomine (BENTYL) 20 MG tablet Take 1 tablet (20 mg total) by mouth every 8 (eight) hours as needed for spasms (Abdominal cramping). 15 tablet 0  . escitalopram (LEXAPRO) 20 MG tablet Take 1 tablet (20 mg total) by mouth at bedtime. 90 tablet 1  . famotidine (PEPCID) 10 MG tablet Take 1 tablet (10 mg total) by mouth 2 (two) times daily. 60 tablet 0  .  ondansetron (ZOFRAN ODT) 8 MG disintegrating tablet Take 1 tablet (8 mg total) by mouth every 8 (eight) hours as needed for nausea or vomiting. 20 tablet 3  . pantoprazole (PROTONIX) 40 MG tablet Take 1-2 tablets (40-80 mg total) by mouth daily. (Patient taking differently: Take 40 mg by mouth 2 (two) times daily. ) 90 tablet 3  . QUEtiapine (SEROQUEL) 100 MG tablet TAKE 1 TABLET BY MOUTH AT BEDTIME 30 tablet 1  . hydrOXYzine (ATARAX/VISTARIL) 25 MG tablet Take 1 tablet (25 mg total) by mouth every 6 (six) hours as needed for anxiety. (Patient not taking: Reported on 12/24/2019) 30 tablet 0  . methylPREDNISolone (MEDROL) 4 MG tablet Take as directed on steroid taper calendar (Patient not taking: Reported on 12/24/2019) 27 tablet 0  . prochlorperazine (COMPAZINE) 10 MG/2ML injection Inject 2 mLs (10 mg total) into the vein every 6 (six) hours as needed. (Patient not taking: Reported on 12/24/2019) 240 mL 0  . promethazine (PHENERGAN) 25 MG suppository Place 1 suppository (25 mg total) rectally every 6 (six) hours as needed for nausea or vomiting. (Patient not taking: Reported on 12/24/2019) 15 each 3  . promethazine (PHENERGAN) 25 MG tablet Take 1 tablet (25 mg total) by mouth every 6 (six) hours as needed for nausea or vomiting. (Patient not taking: Reported on 12/24/2019) 30 tablet 0  . scopolamine (TRANSDERM-SCOP) 1 MG/3DAYS Place 1 patch (1.5 mg total) onto the skin every 3 (three) days for 10 doses. (Patient not taking: Reported on 12/24/2019) 10 patch 0   No facility-administered medications prior to visit.    No Known Allergies  ROS Review of Systems  Constitutional: Negative.   HENT: Negative.   Eyes: Negative.   Respiratory: Negative.   Cardiovascular: Negative.   Gastrointestinal: Negative.   Genitourinary: Negative.   Musculoskeletal: Negative.   Skin: Negative.   Neurological: Negative.   Psychiatric/Behavioral: Negative.       Objective:    Physical Exam Vitals and nursing  note reviewed.  Constitutional:      General: She is not in acute distress.    Appearance: Normal appearance. She is normal weight. She is not ill-appearing, toxic-appearing or diaphoretic.  Cardiovascular:     Rate and Rhythm: Normal rate and regular rhythm.     Heart sounds: Normal heart sounds. No murmur heard.  No friction rub. No gallop.   Pulmonary:     Effort: Pulmonary effort is normal. No respiratory distress.     Breath sounds: Normal breath sounds. No stridor. No wheezing,  rhonchi or rales.  Chest:     Chest wall: No tenderness.  Skin:    General: Skin is warm and dry.  Neurological:     General: No focal deficit present.     Mental Status: She is alert and oriented to person, place, and time. Mental status is at baseline.  Psychiatric:        Mood and Affect: Mood normal.        Behavior: Behavior normal.        Thought Content: Thought content normal.        Judgment: Judgment normal.     BP 132/87   Pulse 86   Temp 98 F (36.7 C) (Temporal)   Resp 18   Ht 5\' 5"  (1.651 m)   Wt 154 lb 12.8 oz (70.2 kg)   LMP 10/31/2019 (Approximate)   SpO2 100%   BMI 25.76 kg/m  Wt Readings from Last 3 Encounters:  12/24/19 154 lb 12.8 oz (70.2 kg)  12/17/19 153 lb 3.5 oz (69.5 kg)  12/04/19 160 lb 8 oz (72.8 kg)     There are no preventive care reminders to display for this patient.  There are no preventive care reminders to display for this patient.  Lab Results  Component Value Date   TSH 0.575 09/16/2017   Lab Results  Component Value Date   WBC 16.4 (H) 12/12/2019   HGB 12.5 12/12/2019   HCT 37.1 12/12/2019   MCV 85.3 12/12/2019   PLT 295 12/12/2019   Lab Results  Component Value Date   NA 130 (L) 12/12/2019   K 3.4 (L) 12/12/2019   CO2 25 12/12/2019   GLUCOSE 116 (H) 12/17/2019   BUN 5 (L) 12/12/2019   CREATININE 0.61 12/12/2019   BILITOT 0.7 12/12/2019   ALKPHOS 56 12/12/2019   AST 20 12/12/2019   ALT 13 12/12/2019   PROT 7.2 12/12/2019    ALBUMIN 3.9 12/12/2019   CALCIUM 9.3 12/12/2019   ANIONGAP 10 12/12/2019   No results found for: CHOL No results found for: HDL No results found for: LDLCALC No results found for: TRIG No results found for: CHOLHDL No results found for: 12/14/2019    Assessment & Plan:   Problem List Items Addressed This Visit    None    Visit Diagnoses    Encounter for other general counseling or advice on contraception    -  Primary   Relevant Orders   Ambulatory referral to Gynecology   Mood disorder [facility])       Relevant Orders   Ambulatory referral to Psychiatry      No orders of the defined types were placed in this encounter.   Follow-up: No follow-ups on file.   PLAN  Refer to psychiatry for mood disorder  Refer to Gyn for [facility]  Discussed with patient the concerns surrounding CVS / cannabinoid hyperemesis. May warrant further work up from Dr. [facility], her GI specialist. Pt would prefer to delay this until she can improve her mental health.  Patient encouraged to call clinic with any questions, comments, or concerns.  Adela Lank, NP

## 2019-12-24 NOTE — Patient Instructions (Signed)
° ° ° °  If you have lab work done today you will be contacted with your lab results within the next 2 weeks.  If you have not heard from us then please contact us. The fastest way to get your results is to register for My Chart. ° ° °IF you received an x-ray today, you will receive an invoice from [facility]. Please contact [facility] at [phone] with questions or concerns regarding your invoice.  ° °IF you received labwork today, you will receive an invoice from [facility]. Please contact [facility] at [phone] with questions or concerns regarding your invoice.  ° °Our billing staff will not be able to assist you with questions regarding bills from these companies. ° °You will be contacted with the lab results as soon as they are available. The fastest way to get your results is to activate your My Chart account. Instructions are located on the last page of this paperwork. If you have not heard from us regarding the results in 2 weeks, please contact this office. °  ° ° ° °

## 2019-12-31 MED FILL — BUSPIRONE HCL 15 MG TABS: 15 | 90 days supply | Qty: 90 | Fill #1

## 2019-12-31 MED FILL — QUETIAPINE FUMARATE 100 MG: 100 | 30 days supply | Qty: 30 | Fill #1

## 2019-12-31 MED FILL — ESCITALOPRAM 20 MG TABLET: 20 | 90 days supply | Qty: 90 | Fill #1

## 2020-01-11 DIAGNOSIS — Z760 Encounter for issue of repeat prescription: Secondary | ICD-10-CM | POA: Diagnosis not present

## 2020-01-21 ENCOUNTER — Telehealth: Payer: Self-pay | Admitting: Licensed Clinical Social Worker

## 2020-01-21 NOTE — Telephone Encounter (Signed)
Left message encouraging contact 

## 2020-01-22 ENCOUNTER — Telehealth: Payer: Self-pay | Admitting: Licensed Clinical Social Worker

## 2020-01-22 ENCOUNTER — Encounter: Payer: Self-pay | Admitting: Licensed Clinical Social Worker

## 2020-01-22 ENCOUNTER — Telehealth (INDEPENDENT_AMBULATORY_CARE_PROVIDER_SITE_OTHER): Payer: 59 | Admitting: Licensed Clinical Social Worker

## 2020-01-22 DIAGNOSIS — F39 Unspecified mood [affective] disorder: Secondary | ICD-10-CM

## 2020-01-22 NOTE — Telephone Encounter (Signed)
Left message encouraging a call back to re-start [facility] services.

## 2020-01-22 NOTE — Telephone Encounter (Signed)
Uva contacted the [facility] line to schedule a 2pm session, same day.

## 2020-01-22 NOTE — BH Specialist Note (Signed)
[facility] Virtual BH Telephone Follow-up  MRN: [id number] NAME: Diana Thomas Date: 01/22/20  Start time:  210p End time:   Total time:   Call number:  [phone]  Reason for call today:  weekly VBH session  PHQ-9 Scores:  Depression screen [facility] 2/9 01/22/2020 12/24/2019 08/28/2019 12/22/2018 10/12/2018  Decreased Interest 3 0 0 0 0  Down, Depressed, Hopeless 2 0 0 0 0  PHQ - 2 Score 5 0 0 0 0  Altered sleeping 0 - - - -  Tired, decreased energy 1 - - - -  Change in appetite 2 - - - -  Feeling bad or failure about yourself  0 - - - -  Trouble concentrating 2 - - - -  Moving slowly or fidgety/restless 1 - - - -  Suicidal thoughts 1 - - - -  PHQ-9 Score 12 - - - -  Difficult doing work/chores Somewhat difficult - - - -   GAD-7 Scores:  GAD 7 : Generalized Anxiety Score 01/22/2020 08/28/2019 04/29/2018 04/29/2018  Nervous, Anxious, on Edge 3 3 3 3   Control/stop worrying 3 3 3 3   Worry too much - different things 3 3 3 3   Trouble relaxing 0 2 3 3   Restless 1 2 1 1   Easily annoyed or irritable 3 2 3 3   Afraid - awful might happen 1 0 0 0  Total GAD 7 Score 14 15 16 16   Anxiety Difficulty Somewhat difficult Very difficult Very difficult Very difficult    Stress Current stressors:  work Sleep:  takes seroquel Appetite:  cycling Coping ability:  overwhelmed Patient taking medications as prescribed:  yes  Current medications:  Outpatient Encounter Medications as of 01/22/2020  Medication Sig  . busPIRone (BUSPAR) 15 MG tablet Take 1 tablet (15 mg total) by mouth at bedtime.  . dicyclomine (BENTYL) 20 MG tablet Take 1 tablet (20 mg total) by mouth every 8 (eight) hours as needed for spasms (Abdominal cramping).  escitalopram (LEXAPRO) 20 MG tablet Take 1 tablet (20 mg total) by mouth at bedtime.  . famotidine (PEPCID) 10 MG tablet Take 1 tablet (10 mg total) by mouth 2 (two) times daily.  . hydrOXYzine (ATARAX/VISTARIL) 25 MG tablet Take 1 tablet (25 mg total) by mouth every 6  (six) hours as needed for anxiety. (Patient not taking: Reported on 12/24/2019)  . methylPREDNISolone (MEDROL) 4 MG tablet Take as directed on steroid taper calendar (Patient not taking: Reported on 12/24/2019)  . ondansetron (ZOFRAN ODT) 8 MG disintegrating tablet Take 1 tablet (8 mg total) by mouth every 8 (eight) hours as needed for nausea or vomiting.  . pantoprazole (PROTONIX) 40 MG tablet Take 1-2 tablets (40-80 mg total) by mouth daily. (Patient taking differently: Take 40 mg by mouth 2 (two) times daily. )  . prochlorperazine (COMPAZINE) 10 MG/2ML injection Inject 2 mLs (10 mg total) into the vein every 6 (six) hours as needed. (Patient not taking: Reported on 12/24/2019)  . promethazine (PHENERGAN) 25 MG suppository Place 1 suppository (25 mg total) rectally every 6 (six) hours as needed for nausea or vomiting. (Patient not taking: Reported on 12/24/2019)  . promethazine (PHENERGAN) 25 MG tablet Take 1 tablet (25 mg total) by mouth every 6 (six) hours as needed for nausea or vomiting. (Patient not taking: Reported on 12/24/2019)  . QUEtiapine (SEROQUEL) 100 MG tablet TAKE 1 TABLET BY MOUTH AT BEDTIME   No facility-administered encounter medications on file as of 01/22/2020.     Self-harm  Behaviors Risk Assessment Self-harm risk factors:  has had thoughts in the past; nothing current; occasional vague thoughts Patient endorses recent thoughts of harming self:    [place] Suicide Severity Rating Scale:  No flowsheet data found. C-SRSS 05/01/18  1. Wish to be Dead No  2. Suicidal Thoughts Yes  3. Suicidal Thoughts with Method Without Specific Plan or Intent to Act No  4. Suicidal Intent Without Specific Plan No  5. Suicide Intent with Specific Plan No  6. Suicide Behavior Question No  7. How long ago did you do any of these? Within the last three months     Danger to Others Risk Assessment Danger to others risk factors:  no Patient endorses recent thoughts of harming others:     Dynamic Appraisal of Situational Aggression (DASA): No flowsheet data found.   Substance Use Assessment Patient recently consumed alcohol:  no  Alcohol Use Disorder Identification Test (AUDIT): No flowsheet data found. Patient recently used drugs:    Opioid Risk Assessment:    Goals, Interventions and Follow-up Plan Goals: Increase healthy adjustment to current life circumstances Interventions: Motivational Interviewing, Solution-Focused Strategies, Mindfulness or Management consultant, Mining engineer and CBT Cognitive Behavioral Therapy Follow-up Plan: Continue weekly VBH sessions  Summary: Diana Thomas is a 24 yr old woman that was referred to [facility] from her PCP at [place].  She reports that she has difficulty with managing her mood.  She will "good mood for 2 weeks then bad mood." She states that she feels like it is a part of her personality.  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 Has support from family and boyfriend. Reports a fair social life; "going out with friends this weekend, since I am feeling well this week."  Discussion of tracking moods and having a weekly session.  Diana Elk, LCSW

## 2020-01-30 ENCOUNTER — Other Ambulatory Visit: Payer: Self-pay

## 2020-01-30 ENCOUNTER — Encounter: Payer: Self-pay | Admitting: Registered Nurse

## 2020-01-30 ENCOUNTER — Ambulatory Visit: Payer: 59 | Admitting: Registered Nurse

## 2020-01-30 VITALS — BP 108/71 | HR 99 | Temp 98.0°F | Resp 18 | Ht 65.0 in | Wt 162.8 lb

## 2020-01-30 DIAGNOSIS — Z3042 Encounter for surveillance of injectable contraceptive: Secondary | ICD-10-CM

## 2020-01-30 DIAGNOSIS — Z3009 Encounter for other general counseling and advice on contraception: Secondary | ICD-10-CM | POA: Diagnosis not present

## 2020-01-30 LAB — POCT URINE PREGNANCY: Preg Test, Ur: NEGATIVE

## 2020-01-30 MED ORDER — MEDROXYPROGESTERONE ACETATE 150 MG/ML IM SUSP
150.0000 mg | Freq: Once | INTRAMUSCULAR | Status: AC
  Administered 2020-01-30: 150 mg via INTRAMUSCULAR

## 2020-01-30 NOTE — Progress Notes (Signed)
Established Patient Office Visit  Subjective:  Patient ID: Diana Thomas, female    DOB: 1995/05/13  Age: 24 y.o. MRN: [id number]  CC:  Chief Complaint  Patient presents with   Contraception    Patient would like to discuss getting birth control. Per patient she has no other questions or concerns.    HPI [name] presents for bcm start  lmp on Sunday, normal, no upic since  Interested in depo inj Has been on before No concerns Understands r/b/se including weight gain, long term risk for loss of bone density, potential for delayed return to fertility after cessation, and risk for irregular menses.  No further concerns.  Past Medical History:  Diagnosis Date   Allergy    Anemia    Anxiety    Depression    Gastritis    GERD (gastroesophageal reflux disease)    Heart murmur     Past Surgical History:  Procedure Laterality Date   NO PAST SURGERIES      Family History  Problem Relation Age of Onset   Heart disease Mother    Hypertension Mother    Hyperlipidemia Mother    ADD / ADHD Father    Healthy Father    Healthy Sister    Liver disease Maternal Grandmother    Diabetes Maternal Uncle    Colon cancer Neg Hx    Esophageal cancer Neg Hx    Stomach cancer Neg Hx    Rectal cancer Neg Hx     Social History   Socioeconomic History   Marital status: Single    Spouse name: Not on file   Number of children: 0   Years of education: Not on file   Highest education level: Not on file  Occupational History   Occupation: RN  Tobacco Use   Smoking status: Never Smoker   Smokeless tobacco: Never Used  Building services engineer Use: Never used  Substance and Sexual Activity   Alcohol use: Not Currently    Comment: occasional drinker   Drug use: Yes    Types: Marijuana    Comment: 12/03/19   Sexual activity: Yes  Other Topics Concern   Not on file  Social History Narrative   Not on file   Social Determinants of  Health   Financial Resource Strain: Not on file  Food Insecurity: Not on file  Transportation Needs: Not on file  Physical Activity: Not on file  Stress: Not on file  Social Connections: Not on file  Intimate Partner Violence: Not on file    Outpatient Medications Prior to Visit  Medication Sig Dispense Refill   busPIRone (BUSPAR) 15 MG tablet Take 1 tablet (15 mg total) by mouth at bedtime. 90 tablet 3   escitalopram (LEXAPRO) 20 MG tablet Take 1 tablet (20 mg total) by mouth at bedtime. 90 tablet 1   ondansetron (ZOFRAN ODT) 8 MG disintegrating tablet Take 1 tablet (8 mg total) by mouth every 8 (eight) hours as needed for nausea or vomiting. 20 tablet 3   pantoprazole (PROTONIX) 40 MG tablet Take 1-2 tablets (40-80 mg total) by mouth daily. (Patient taking differently: Take 40 mg by mouth 2 (two) times daily.) 90 tablet 3   promethazine (PHENERGAN) 25 MG suppository Place 1 suppository (25 mg total) rectally every 6 (six) hours as needed for nausea or vomiting. 15 each 3   promethazine (PHENERGAN) 25 MG tablet Take 1 tablet (25 mg total) by mouth every 6 (six) hours as  needed for nausea or vomiting. 30 tablet 0   QUEtiapine (SEROQUEL) 100 MG tablet TAKE 1 TABLET BY MOUTH AT BEDTIME 30 tablet 1   dicyclomine (BENTYL) 20 MG tablet Take 1 tablet (20 mg total) by mouth every 8 (eight) hours as needed for spasms (Abdominal cramping). (Patient not taking: Reported on 01/30/2020) 15 tablet 0   famotidine (PEPCID) 10 MG tablet Take 1 tablet (10 mg total) by mouth 2 (two) times daily. 60 tablet 0   hydrOXYzine (ATARAX/VISTARIL) 25 MG tablet Take 1 tablet (25 mg total) by mouth every 6 (six) hours as needed for anxiety. (Patient not taking: Reported on 12/24/2019) 30 tablet 0   methylPREDNISolone (MEDROL) 4 MG tablet Take as directed on steroid taper calendar (Patient not taking: Reported on 12/24/2019) 27 tablet 0   prochlorperazine (COMPAZINE) 10 MG/2ML injection Inject 2 mLs (10 mg  total) into the vein every 6 (six) hours as needed. (Patient not taking: Reported on 01/30/2020) 240 mL 0   No facility-administered medications prior to visit.    No Known Allergies  ROS Review of Systems  Constitutional: Negative.   HENT: Negative.   Eyes: Negative.   Respiratory: Negative.   Cardiovascular: Negative.   Gastrointestinal: Negative.   Genitourinary: Negative.   Musculoskeletal: Negative.   Skin: Negative.   Neurological: Negative.   Psychiatric/Behavioral: Negative.   All other systems reviewed and are negative.     Objective:    Physical Exam Vitals and nursing note reviewed.  Constitutional:      General: She is not in acute distress.    Appearance: Normal appearance. She is normal weight. She is not ill-appearing, toxic-appearing or diaphoretic.  Cardiovascular:     Rate and Rhythm: Normal rate and regular rhythm.     Heart sounds: Normal heart sounds. No murmur heard. No friction rub. No gallop.   Pulmonary:     Effort: Pulmonary effort is normal. No respiratory distress.     Breath sounds: Normal breath sounds. No stridor. No wheezing, rhonchi or rales.  Chest:     Chest wall: No tenderness.  Skin:    General: Skin is warm and dry.  Neurological:     General: No focal deficit present.     Mental Status: She is alert and oriented to person, place, and time. Mental status is at baseline.  Psychiatric:        Mood and Affect: Mood normal.        Behavior: Behavior normal.        Thought Content: Thought content normal.        Judgment: Judgment normal.     BP 108/71    Pulse 99    Temp 98 F (36.7 C) (Temporal)    Resp 18    Ht 5\' 5"  (1.651 m)    Wt 162 lb 12.8 oz (73.8 kg)    LMP 10/27/2019    SpO2 98%    BMI 27.09 kg/m  Wt Readings from Last 3 Encounters:  01/30/20 162 lb 12.8 oz (73.8 kg)  12/24/19 154 lb 12.8 oz (70.2 kg)  12/17/19 153 lb 3.5 oz (69.5 kg)     There are no preventive care reminders to display for this  patient.  There are no preventive care reminders to display for this patient.  Lab Results  Component Value Date   TSH 0.575 09/16/2017   Lab Results  Component Value Date   WBC 16.4 (H) 12/12/2019   HGB 12.5 12/12/2019   HCT 37.1 12/12/2019  MCV 85.3 12/12/2019   PLT 295 12/12/2019   Lab Results  Component Value Date   NA 130 (L) 12/12/2019   K 3.4 (L) 12/12/2019   CO2 25 12/12/2019   GLUCOSE 116 (H) 12/17/2019   BUN 5 (L) 12/12/2019   CREATININE 0.61 12/12/2019   BILITOT 0.7 12/12/2019   ALKPHOS 56 12/12/2019   AST 20 12/12/2019   ALT 13 12/12/2019   PROT 7.2 12/12/2019   ALBUMIN 3.9 12/12/2019   CALCIUM 9.3 12/12/2019   ANIONGAP 10 12/12/2019   No results found for: CHOL No results found for: HDL No results found for: LDLCALC No results found for: TRIG No results found for: CHOLHDL No results found for: XMIW8E    Assessment & Plan:   Problem List Items Addressed This Visit   None   Visit Diagnoses    Encounter for management and injection of depo-Provera    -  Primary   Relevant Medications   medroxyPROGESTERone (DEPO-PROVERA) injection 150 mg (Completed)   Other Relevant Orders   POCT urine pregnancy (Completed)   Birth control counseling       Relevant Medications   medroxyPROGESTERone (DEPO-PROVERA) injection 150 mg (Completed)   Other Relevant Orders   POCT urine pregnancy (Completed)      Meds ordered this encounter  Medications   medroxyPROGESTERone (DEPO-PROVERA) injection 150 mg    Follow-up: No follow-ups on file.   PLAN  Urine preg test negative  Start depo today  Return in appropriate window of time  Condoms or abstain for next 7 days  OV with provider annually  Patient encouraged to call clinic with any questions, comments, or concerns.  Janeece Agee, NP

## 2020-01-30 NOTE — Patient Instructions (Signed)
° ° ° °  If you have lab work done today you will be contacted with your lab results within the next 2 weeks.  If you have not heard from us then please contact us. The fastest way to get your results is to register for My Chart. ° ° °IF you received an x-ray today, you will receive an invoice from [facility]. Please contact [facility] at [phone] with questions or concerns regarding your invoice.  ° °IF you received labwork today, you will receive an invoice from [facility]. Please contact [facility] at [phone] with questions or concerns regarding your invoice.  ° °Our billing staff will not be able to assist you with questions regarding bills from these companies. ° °You will be contacted with the lab results as soon as they are available. The fastest way to get your results is to activate your My Chart account. Instructions are located on the last page of this paperwork. If you have not heard from us regarding the results in 2 weeks, please contact this office. °  ° ° ° °

## 2020-02-09 ENCOUNTER — Other Ambulatory Visit: Payer: Self-pay

## 2020-02-09 ENCOUNTER — Encounter (HOSPITAL_COMMUNITY): Payer: Self-pay | Admitting: Emergency Medicine

## 2020-02-09 ENCOUNTER — Emergency Department (HOSPITAL_COMMUNITY)
Admission: EM | Admit: 2020-02-09 | Discharge: 2020-02-09 | Disposition: A | Discharge status: Home / Self Care | Payer: 59 | Attending: Emergency Medicine | Admitting: Emergency Medicine

## 2020-02-09 DIAGNOSIS — R112 Nausea with vomiting, unspecified: Secondary | ICD-10-CM | POA: Insufficient documentation

## 2020-02-09 DIAGNOSIS — R109 Unspecified abdominal pain: Secondary | ICD-10-CM | POA: Diagnosis present

## 2020-02-09 DIAGNOSIS — R1013 Epigastric pain: Secondary | ICD-10-CM | POA: Diagnosis not present

## 2020-02-09 DIAGNOSIS — R9431 Abnormal electrocardiogram [ECG] [EKG]: Secondary | ICD-10-CM | POA: Diagnosis not present

## 2020-02-09 DIAGNOSIS — E876 Hypokalemia: Secondary | ICD-10-CM | POA: Diagnosis not present

## 2020-02-09 HISTORY — DX: Cannabis use, unspecified, uncomplicated: F12.90

## 2020-02-09 HISTORY — DX: Cannabis use, unspecified, uncomplicated: R11.2

## 2020-02-09 LAB — COMPREHENSIVE METABOLIC PANEL
ALT: 27 U/L (ref 0–44)
AST: 21 U/L (ref 15–41)
Albumin: 4.9 g/dL (ref 3.5–5.0)
Alkaline Phosphatase: 63 U/L (ref 38–126)
Anion gap: 14 (ref 5–15)
BUN: 21 mg/dL — ABNORMAL HIGH (ref 6–20)
CO2: 25 mmol/L (ref 22–32)
Calcium: 9.4 mg/dL (ref 8.9–10.3)
Chloride: 100 mmol/L (ref 98–111)
Creatinine, Ser: 0.83 mg/dL (ref 0.44–1.00)
GFR, Estimated: >60 mL/min (ref >60)
Glucose, Bld: 115 mg/dL — ABNORMAL HIGH (ref 70–99)
Potassium: 2.7 mmol/L — CL (ref 3.5–5.1)
Sodium: 139 mmol/L (ref 135–145)
Total Bilirubin: 0.8 mg/dL (ref 0.3–1.2)
Total Protein: 8.8 g/dL — ABNORMAL HIGH (ref 6.5–8.1)

## 2020-02-09 LAB — CBC WITH DIFFERENTIAL/PLATELET
Abs Immature Granulocytes: 0.04 10*3/uL (ref 0.00–0.07)
Basophils Absolute: 0 10*3/uL (ref 0.0–0.1)
Basophils Relative: 0 %
Eosinophils Absolute: 0 10*3/uL (ref 0.0–0.5)
Eosinophils Relative: 0 %
HCT: 41.9 % (ref 36.0–46.0)
Hemoglobin: 13.6 g/dL (ref 12.0–15.0)
Immature Granulocytes: 0 %
Lymphocytes Relative: 6 %
Lymphs Abs: 0.8 10*3/uL (ref 0.7–4.0)
MCH: 28.3 pg (ref 26.0–34.0)
MCHC: 32.5 g/dL (ref 30.0–36.0)
MCV: 87.1 fL (ref 80.0–100.0)
Monocytes Absolute: 0.6 10*3/uL (ref 0.1–1.0)
Monocytes Relative: 4 %
Neutro Abs: 11.4 10*3/uL — ABNORMAL HIGH (ref 1.7–7.7)
Neutrophils Relative %: 90 %
Platelets: 311 10*3/uL (ref 150–400)
RBC: 4.81 MIL/uL (ref 3.87–5.11)
RDW: 13.2 % (ref 11.5–15.5)
WBC: 12.9 10*3/uL — ABNORMAL HIGH (ref 4.0–10.5)
nRBC: 0 % (ref 0.0–0.2)

## 2020-02-09 LAB — URINALYSIS, ROUTINE W REFLEX MICROSCOPIC
Bacteria, UA: NONE SEEN
Bilirubin Urine: NEGATIVE
Glucose, UA: NEGATIVE mg/dL
Hgb urine dipstick: NEGATIVE
Ketones, ur: 80 mg/dL — AB
Leukocytes,Ua: NEGATIVE
Nitrite: NEGATIVE
Protein, ur: 100 mg/dL — AB
RBC / HPF: >50 RBC/hpf — ABNORMAL HIGH (ref 0–5)
Specific Gravity, Urine: 1.036 — ABNORMAL HIGH (ref 1.005–1.030)
pH: 5 (ref 5.0–8.0)

## 2020-02-09 LAB — RAPID URINE DRUG SCREEN, HOSP PERFORMED
Amphetamines: NOT DETECTED
Barbiturates: NOT DETECTED
Benzodiazepines: NOT DETECTED
Cocaine: NOT DETECTED
Opiates: NOT DETECTED
Tetrahydrocannabinol: POSITIVE — AB

## 2020-02-09 LAB — LIPASE, BLOOD: Lipase: 22 U/L (ref 11–51)

## 2020-02-09 LAB — LACTIC ACID, PLASMA: Lactic Acid, Venous: 1.1 mmol/L (ref 0.5–1.9)

## 2020-02-09 LAB — ETHANOL: Alcohol, Ethyl (B): <10 mg/dL (ref <10)

## 2020-02-09 MED ORDER — METOCLOPRAMIDE HCL 5 MG/ML IJ SOLN
10.0000 mg | Freq: Once | INTRAMUSCULAR | Status: AC
  Administered 2020-02-09: 10 mg via INTRAVENOUS
  Filled 2020-02-09: qty 2

## 2020-02-09 MED ORDER — POTASSIUM CHLORIDE CRYS ER 20 MEQ PO TBCR: 20.0000 meq | EXTENDED_RELEASE_TABLET | Freq: Two times a day (BID) | ORAL | 0 refills | Status: DC

## 2020-02-09 MED ORDER — SODIUM CHLORIDE 0.9 % IV BOLUS
1000.0000 mL | Freq: Once | INTRAVENOUS | Status: AC
  Administered 2020-02-09: 1000 mL via INTRAVENOUS

## 2020-02-09 MED ORDER — HALOPERIDOL LACTATE 5 MG/ML IJ SOLN
2.0000 mg | Freq: Once | INTRAMUSCULAR | Status: AC
  Administered 2020-02-09: 2 mg via INTRAVENOUS
  Filled 2020-02-09: qty 1

## 2020-02-09 MED ORDER — POTASSIUM CHLORIDE CRYS ER 20 MEQ PO TBCR: 40.0000 meq | EXTENDED_RELEASE_TABLET | Freq: Once | ORAL | Status: DC

## 2020-02-09 MED ORDER — LORAZEPAM 2 MG/ML IJ SOLN
1.0000 mg | Freq: Once | INTRAMUSCULAR | Status: AC
  Administered 2020-02-09: 1 mg via INTRAVENOUS
  Filled 2020-02-09: qty 1

## 2020-02-09 MED ORDER — HALOPERIDOL LACTATE 5 MG/ML IJ SOLN
5.0000 mg | Freq: Once | INTRAMUSCULAR | Status: AC
  Administered 2020-02-09: 5 mg via INTRAVENOUS
  Filled 2020-02-09: qty 1

## 2020-02-09 MED ORDER — FAMOTIDINE IN NACL 20-0.9 MG/50ML-% IV SOLN
20.0000 mg | Freq: Once | INTRAVENOUS | Status: AC
  Administered 2020-02-09: 20 mg via INTRAVENOUS
  Filled 2020-02-09: qty 50

## 2020-02-09 MED ORDER — ONDANSETRON HCL 4 MG/2ML IJ SOLN
4.0000 mg | Freq: Once | INTRAMUSCULAR | Status: AC
  Administered 2020-02-09: 4 mg via INTRAVENOUS
  Filled 2020-02-09: qty 2

## 2020-02-09 MED ORDER — PROMETHAZINE HCL 25 MG RE SUPP: 25.0000 mg | Freq: Four times a day (QID) | RECTAL | 0 refills | Status: DC | PRN

## 2020-02-09 MED ORDER — ONDANSETRON 8 MG PO TBDP: 8.0000 mg | ORAL_TABLET | Freq: Three times a day (TID) | ORAL | 0 refills | Status: DC | PRN

## 2020-02-09 MED ORDER — DIPHENHYDRAMINE HCL 50 MG/ML IJ SOLN
25.0000 mg | Freq: Once | INTRAMUSCULAR | Status: AC
  Administered 2020-02-09: 25 mg via INTRAVENOUS
  Filled 2020-02-09: qty 1

## 2020-02-09 NOTE — ED Triage Notes (Signed)
Patient states that she smoked marijuana today and is now having epigastric pain with vomiting. Patient has known history of cannabinoid hyperemesis syndrome.

## 2020-02-09 NOTE — ED Notes (Signed)
IV team at bedside 

## 2020-02-09 NOTE — ED Provider Notes (Signed)
[facility]-EMERGENCY DEPT Provider Note   CSN: [id number] Arrival date & time: 02/09/20  0202   Time seen 3:18 AM  History Chief Complaint  Patient presents with  . Abdominal Pain    Hx cannabis hyperemesis syndrome, smoked marijuana today    Diana Thomas is a 24 y.o. female.  HPI   Patient states she has a history of cannabinoid hyperemesis syndrome.  She had stopped smoking for about a month however she states she restarted in December.  She states she was pregnant in October however she lost the baby.  She states "I think I am addicted" when asked why she restarted.  She states on December 22 she started having epigastric burning squeezing pain which is similar to the pain she has had in the past with her hyperemesis syndrome.  She states the pain is been there constantly and she was vomiting but now she is just having dry heaves with some acid coming out.  She states she has tried a scopolamine patch, Zofran, Phenergan suppositories, Nexium, Seroquel, Atarax and warm baths and a heat pad without relief.  PCP Janeece AgeeMorrow, Richard, NP   Past Medical History:  Diagnosis Date  . Allergy   . Anemia   . Anxiety   . Cannabinoid hyperemesis syndrome   . Depression   . Gastritis   . GERD (gastroesophageal reflux disease)   . Heart murmur     Patient Active Problem List   Diagnosis Date Noted  . Cannabinoid hyperemesis syndrome 12/12/2019  . Moderate episode of recurrent major depressive disorder (HCC) 04/29/2018  . Gastroesophageal reflux disease without esophagitis 04/29/2018  . Generalized anxiety disorder 09/16/2017    Past Surgical History:  Procedure Laterality Date  . NO PAST SURGERIES       OB History    Gravida  1   Para      Term      Preterm      AB      Living        SAB      IAB      Ectopic      Multiple      Live Births              Family History  Problem Relation Age of Onset  . Heart disease Mother   .  Hypertension Mother   . Hyperlipidemia Mother   . ADD / ADHD Father   . Healthy Father   . Healthy Sister   . Liver disease Maternal Grandmother   . Diabetes Maternal Uncle   . Colon cancer Neg Hx   . Esophageal cancer Neg Hx   . Stomach cancer Neg Hx   . Rectal cancer Neg Hx     Social History   Tobacco Use  . Smoking status: Never Smoker  . Smokeless tobacco: Never Used  Vaping Use  . Vaping Use: Never used  Substance Use Topics  . Alcohol use: Not Currently    Comment: occasional drinker  . Drug use: Yes    Types: Marijuana    Comment: 02/08/2020  Patient is a RN  Home Medications Prior to Admission medications   Medication Sig Start Date End Date Taking? Authorizing Provider  busPIRone (BUSPAR) 15 MG tablet Take 1 tablet (15 mg total) by mouth at bedtime. 08/28/19  Yes Lezlie LyeSantiago Lago, Meda CoffeeIrma M, MD  dicyclomine (BENTYL) 20 MG tablet Take 1 tablet (20 mg total) by mouth every 8 (eight) hours as needed for  spasms (Abdominal cramping). 05/31/19  Yes Ward, Kristen N, DO  escitalopram (LEXAPRO) 20 MG tablet Take 1 tablet (20 mg total) by mouth at bedtime. 08/28/19  Yes Lezlie Lye, Meda Coffee, MD  esomeprazole (NEXIUM) 40 MG capsule Take 40 mg by mouth daily at 12 noon. OTC   Yes [provider]  medroxyPROGESTERone (DEPO-PROVERA) 150 MG/ML injection Inject 150 mg into the muscle every 3 (three) months.   Yes [provider]  promethazine (PHENERGAN) 25 MG suppository Place 1 suppository (25 mg total) rectally every 6 (six) hours as needed for nausea or vomiting. 12/17/19  Yes Constant, Peggy, MD  promethazine (PHENERGAN) 25 MG tablet Take 1 tablet (25 mg total) by mouth every 6 (six) hours as needed for nausea or vomiting. 08/28/19  Yes Lezlie Lye, Meda Coffee, MD  QUEtiapine (SEROQUEL) 100 MG tablet TAKE 1 TABLET BY MOUTH AT BEDTIME Patient taking differently: Take 100 mg by mouth at bedtime. 11/26/19  Yes Lezlie Lye, Meda Coffee, MD  scopolamine (TRANSDERM-SCOP) 1  MG/3DAYS Place 1 patch onto the skin every 3 (three) days.   Yes [provider]  famotidine (PEPCID) 10 MG tablet Take 1 tablet (10 mg total) by mouth 2 (two) times daily. 12/03/19 01/02/20  Calvert Cantor, CNM  hydrOXYzine (ATARAX/VISTARIL) 25 MG tablet Take 1 tablet (25 mg total) by mouth every 6 (six) hours as needed for anxiety. Patient not taking: No sig reported 12/17/19   Constant, Peggy, MD  methylPREDNISolone (MEDROL) 4 MG tablet Take as directed on steroid taper calendar 12/17/19   [place] Bing, MD  ondansetron (ZOFRAN ODT) 8 MG disintegrating tablet Take 1 tablet (8 mg total) by mouth every 8 (eight) hours as needed for nausea or vomiting. Patient not taking: Reported on 02/09/2020 08/28/19   Lezlie Lye, Meda Coffee, MD  ondansetron (ZOFRAN ODT) 8 MG disintegrating tablet Take 1 tablet (8 mg total) by mouth every 8 (eight) hours as needed for nausea or vomiting. 02/09/20   Devoria Albe, MD  pantoprazole (PROTONIX) 40 MG tablet Take 1-2 tablets (40-80 mg total) by mouth daily. Patient not taking: No sig reported 10/12/18   Lezlie Lye, Meda Coffee, MD  potassium chloride SA (KLOR-CON) 20 MEQ tablet Take 1 tablet (20 mEq total) by mouth 2 (two) times daily. 02/09/20   Devoria Albe, MD  prochlorperazine (COMPAZINE) 10 MG/2ML injection Inject 2 mLs (10 mg total) into the vein every 6 (six) hours as needed. Patient not taking: No sig reported 12/17/19   Constant, Peggy, MD  promethazine (PHENERGAN) 25 MG suppository Place 1 suppository (25 mg total) rectally every 6 (six) hours as needed for nausea or vomiting. 02/09/20   Devoria Albe, MD    Allergies    Patient has no known allergies.  Review of Systems   Review of Systems  All other systems reviewed and are negative.   Physical Exam Updated Vital Signs BP 118/64   Pulse 70   Temp 97.9 F (36.6 C) (Oral)   Resp 18   Ht 5\' 5"  (1.651 m)   Wt 73.5 kg   LMP 10/31/2019 (Approximate)   SpO2 97%   BMI 26.96 kg/m   Physical  Exam Vitals and nursing note reviewed.  Constitutional:      General: She is in acute distress.     Appearance: Normal appearance. She is normal weight. She is not ill-appearing or toxic-appearing.  HENT:     Head: Normocephalic and atraumatic.     Right Ear: External ear normal.  Left Ear: External ear normal.     Mouth/Throat:     Mouth: Mucous membranes are dry.  Eyes:     Extraocular Movements: Extraocular movements intact.     Conjunctiva/sclera: Conjunctivae normal.     Pupils: Pupils are equal, round, and reactive to light.  Cardiovascular:     Rate and Rhythm: Normal rate and regular rhythm.     Pulses: Normal pulses.     Heart sounds: Normal heart sounds.  Pulmonary:     Effort: Pulmonary effort is normal. No respiratory distress.     Breath sounds: Normal breath sounds.  Abdominal:     General: Bowel sounds are normal.     Palpations: Abdomen is soft.     Tenderness: There is abdominal tenderness. There is no guarding or rebound.     Comments: She states when I press anywhere on her abdomen it makes it hurt in her epigastric area.  Musculoskeletal:        General: Normal range of motion.     Cervical back: Normal range of motion and neck supple.  Skin:    General: Skin is warm and dry.  Neurological:     General: No focal deficit present.     Mental Status: She is alert and oriented to person, place, and time.     Cranial Nerves: No cranial nerve deficit.  Psychiatric:        Mood and Affect: Affect is flat.        Speech: Speech normal.        Behavior: Behavior normal. Behavior is cooperative.     ED Results / Procedures / Treatments   Labs (all labs ordered are listed, but only abnormal results are displayed)   Results for orders placed or performed during the hospital encounter of 02/09/20  Lipase, blood  Result Value Ref Range   Lipase 22 11 - 51 U/L  Comprehensive metabolic panel  Result Value Ref Range   Sodium 139 135 - 145 mmol/L    Potassium 2.7 (LL) 3.5 - 5.1 mmol/L   Chloride 100 98 - 111 mmol/L   CO2 25 22 - 32 mmol/L   Glucose, Bld 115 (H) 70 - 99 mg/dL   BUN 21 (H) 6 - 20 mg/dL   Creatinine, Ser 1.61 0.44 - 1.00 mg/dL   Calcium 9.4 8.9 - 09.6 mg/dL   Total Protein 8.8 (H) 6.5 - 8.1 g/dL   Albumin 4.9 3.5 - 5.0 g/dL   AST 21 15 - 41 U/L   ALT 27 0 - 44 U/L   Alkaline Phosphatase 63 38 - 126 U/L   Total Bilirubin 0.8 0.3 - 1.2 mg/dL   GFR, Estimated >04 >54 mL/min   Anion gap 14 5 - 15  Ethanol  Result Value Ref Range   Alcohol, Ethyl (B) <10 <10 mg/dL  CBC with Differential  Result Value Ref Range   WBC 12.9 (H) 4.0 - 10.5 K/uL   RBC 4.81 3.87 - 5.11 MIL/uL   Hemoglobin 13.6 12.0 - 15.0 g/dL   HCT 09.8 11.9 - 14.7 %   MCV 87.1 80.0 - 100.0 fL   MCH 28.3 26.0 - 34.0 pg   MCHC 32.5 30.0 - 36.0 g/dL   RDW 82.9 56.2 - 13.0 %   Platelets 311 150 - 400 K/uL   nRBC 0.0 0.0 - 0.2 %   Neutrophils Relative % 90 %   Neutro Abs 11.4 (H) 1.7 - 7.7 K/uL   Lymphocytes Relative 6 %  Lymphs Abs 0.8 0.7 - 4.0 K/uL   Monocytes Relative 4 %   Monocytes Absolute 0.6 0.1 - 1.0 K/uL   Eosinophils Relative 0 %   Eosinophils Absolute 0.0 0.0 - 0.5 K/uL   Basophils Relative 0 %   Basophils Absolute 0.0 0.0 - 0.1 K/uL   Immature Granulocytes 0 %   Abs Immature Granulocytes 0.04 0.00 - 0.07 K/uL  Lactic acid, plasma  Result Value Ref Range   Lactic Acid, Venous 1.1 0.5 - 1.9 mmol/L  Urinalysis, Routine w reflex microscopic Urine, Clean Catch  Result Value Ref Range   Color, Urine AMBER (A) YELLOW   APPearance CLOUDY (A) CLEAR   Specific Gravity, Urine 1.036 (H) 1.005 - 1.030   pH 5.0 5.0 - 8.0   Glucose, UA NEGATIVE NEGATIVE mg/dL   Hgb urine dipstick NEGATIVE NEGATIVE   Bilirubin Urine NEGATIVE NEGATIVE   Ketones, ur 80 (A) NEGATIVE mg/dL   Protein, ur 885 (A) NEGATIVE mg/dL   Nitrite NEGATIVE NEGATIVE   Leukocytes,Ua NEGATIVE NEGATIVE   RBC / HPF >50 (H) 0 - 5 RBC/hpf   WBC, UA 11-20 0 - 5 WBC/hpf    Bacteria, UA NONE SEEN NONE SEEN   Squamous Epithelial / LPF 6-10 0 - 5   Mucus PRESENT    Non Squamous Epithelial 6-10 (A) NONE SEEN  Urine rapid drug screen (hosp performed)  Result Value Ref Range   Opiates NONE DETECTED NONE DETECTED   Cocaine NONE DETECTED NONE DETECTED   Benzodiazepines NONE DETECTED NONE DETECTED   Amphetamines NONE DETECTED NONE DETECTED   Tetrahydrocannabinol POSITIVE (A) NONE DETECTED   Barbiturates NONE DETECTED NONE DETECTED    Laboratory interpretation all normal except hypokalemia, EKG was done, leukocytosis, normal lactic acid, urinalysis was concentrated consistent with dehydration with ketones, contaminated urine    Results for orders placed or performed in visit on 01/30/20  POCT urine pregnancy  Result Value Ref Range   Preg Test, Ur Negative Negative      EKG EKG Interpretation  Date/Time:  Saturday February 09 2020 06:52:31 EST Ventricular Rate:  86 PR Interval:    QRS Duration: 77 QT Interval:  388 QTC Calculation: 465 R Axis:   71 Text Interpretation: Sinus rhythm Since last tracing 02 Dec 2019 Borderline T abnormalities, anterior leads Confirmed by Devoria Albe ([id number]) on 02/09/2020 7:18:44 AM     Radiology No results found.  Procedures Procedures (including critical care time)  Medications Ordered in ED Medications  potassium chloride SA (KLOR-CON) CR tablet 40 mEq (0 mEq Oral Hold 02/09/20 0641)  sodium chloride 0.9 % bolus 1,000 mL (has no administration in time range)  famotidine (PEPCID) IVPB 20 mg premix (has no administration in time range)  sodium chloride 0.9 % bolus 1,000 mL (1,000 mLs Intravenous New Bag/Given (Non-Interop) 02/09/20 0529)  ondansetron (ZOFRAN) injection 4 mg (4 mg Intravenous Given 02/09/20 0534)  haloperidol lactate (HALDOL) injection 2 mg (2 mg Intravenous Given 02/09/20 0431)  diphenhydrAMINE (BENADRYL) injection 25 mg (25 mg Intravenous Given 02/09/20 0536)  LORazepam (ATIVAN) injection 1 mg  (1 mg Intravenous Given 02/09/20 0537)  sodium chloride 0.9 % bolus 1,000 mL (1,000 mLs Intravenous New Bag/Given (Non-Interop) 02/09/20 0528)  metoCLOPramide (REGLAN) injection 10 mg (10 mg Intravenous Given 02/09/20 1287)    ED Course  I have reviewed the triage vital signs and the nursing notes.  Pertinent labs & imaging results that were available during my care of the patient were reviewed by me and considered in  my medical decision making (see chart for details).    MDM Rules/Calculators/A&P                            Patient was given IV fluids, IV Haldol, Ativan and Benadryl.  Laboratory testing was done.  IV team had to be called to start her IV.  6:40 AM patient's potassium has come back at 2.7.  Patient has no veins, I did not want to give her IV potassium.  When I rechecked the patient she still has some nausea, she states her abdominal pain was better.  She was given more nausea medication and when she is able we will give her oral potassium.  Pt discussed with Dr Lockie Mola at change of shift.   Final Clinical Impression(s) / ED Diagnoses Final diagnoses:  Epigastric abdominal pain  Non-intractable vomiting with nausea, unspecified vomiting type  Hypokalemia    Rx / DC Orders ED Discharge Orders         Ordered    potassium chloride SA (KLOR-CON) 20 MEQ tablet  2 times daily        02/09/20 0720    promethazine (PHENERGAN) 25 MG suppository  Every 6 hours PRN        02/09/20 0720    ondansetron (ZOFRAN ODT) 8 MG disintegrating tablet  Every 8 hours PRN        02/09/20 0723         Plan discharge  Devoria Albe, MD, Concha Pyo, MD 02/09/20 [phone]

## 2020-02-09 NOTE — Discharge Instructions (Addendum)
Drink plenty of fluids.  When you are able take the potassium pills until gone.  Use the Phenergan suppositories for nausea or vomiting.  Use the Zofran ODT which dissolves on top or under your tongue for nausea or vomiting. YOU NEED TO AVOID SMOKING MARIJUANA!!  Your company of employment may have some employee health resources available for substance abuse.  You can check into that or look at the resource guide to get some counseling if you need help stopping smoking marijuana.

## 2020-02-09 NOTE — ED Notes (Signed)
Date and time results received: 02/09/20 6:23 AM (use smartphrase ".now" to insert current time)  Test: Potassium Critical Value: 2.7  Name of Provider Notified: Dr. Lynelle Doctor  Orders Received? Or Actions Taken?: None

## 2020-02-09 NOTE — ED Provider Notes (Signed)
Patient here with hyperemesis likely in the setting of marijuana use.  Multi-IV antiemetics have been given as well as IV fluid bolus and potassium.  Patient improving.  Upon my evaluation she is able to tolerate p.o. but would like additional antiemetics.  We will give her IV Haldol and reevaluate.  Feeling better, tolerating p.o.  Discharged in good condition.  This chart was dictated using voice recognition software.  Despite best efforts to proofread,  errors can occur which can change the documentation meaning.     Virgina Norfolk, DO 02/09/20 1105

## 2020-02-11 ENCOUNTER — Other Ambulatory Visit: Payer: Self-pay | Admitting: Registered Nurse

## 2020-02-11 MED FILL — ONDANSETRON ODT 8 MG TABLET: 8 | 6 days supply | Qty: 20 | Fill #2

## 2020-02-11 NOTE — Telephone Encounter (Signed)
Requested Prescriptions  Pending Prescriptions Disp Refills  . QUEtiapine (SEROQUEL) 100 MG tablet [Pharmacy Med Name: QUETIAPINE FUMARATE 100 MG 100 Tablet] 30 tablet     Sig: TAKE 1 TABLET BY MOUTH AT BEDTIME     Not Delegated - Psychiatry:  Antipsychotics - Second Generation (Atypical) - quetiapine Failed - 02/11/2020  2:31 PM      Failed - This refill cannot be delegated      Passed - ALT in normal range and within 180 days    ALT  Date Value Ref Range Status  02/09/2020 27 0 - 44 U/L Final         Passed - AST in normal range and within 180 days    AST  Date Value Ref Range Status  02/09/2020 21 15 - 41 U/L Final         Passed - Completed PHQ-2 or PHQ-9 in the last 360 days      Passed - Last BP in normal range    BP Readings from Last 1 Encounters:  02/09/20 126/80         Passed - Valid encounter within last 6 months    Recent Outpatient Visits          1 week ago Encounter for management and injection of depo-Provera   [facility], Gerlene Burdock, NP   1 month ago Encounter for other general counseling or advice on contraception   [facility], Gerlene Burdock, NP   5 months ago Mood disorder [facility])   [facility] at [facility], Meda Coffee, MD   11 months ago Encounter for surveillance of injectable contraceptive   Primary Care at [facility], Meda Coffee, MD   1 year ago Encounter for surveillance of injectable contraceptive   Primary Care at [facility], Meda Coffee, MD             . promethazine (PHENERGAN) 25 MG tablet [Pharmacy Med Name: PROMETHAZINE 25 MG TABLET 25 Tablet] 30 tablet     Sig: TAKE 1 TABLET BY MOUTH EVERY 6 HOURS AS NEEDED FOR NAUSEA AND/OR VOMITING     Not Delegated - Gastroenterology: Antiemetics Failed - 02/11/2020  2:31 PM      Failed - This refill cannot be delegated      Passed - Valid encounter within last 6 months    Recent Outpatient Visits          1 week ago Encounter for management and  injection of depo-Provera   [facility], Gerlene Burdock, NP   1 month ago Encounter for other general counseling or advice on contraception   [facility], Gerlene Burdock, NP   5 months ago Mood disorder [facility])   Primary Care at [facility], Meda Coffee, MD   11 months ago Encounter for surveillance of injectable contraceptive   Primary Care at [facility], Meda Coffee, MD   1 year ago Encounter for surveillance of injectable contraceptive   Primary Care at [facility], Meda Coffee, MD

## 2020-02-11 NOTE — Telephone Encounter (Signed)
Patient is requesting a refill of the following medications: Requested Prescriptions   Pending Prescriptions Disp Refills   QUEtiapine (SEROQUEL) 100 MG tablet [Pharmacy Med Name: QUETIAPINE FUMARATE 100 MG 100 Tablet] 30 tablet     Sig: TAKE 1 TABLET BY MOUTH AT BEDTIME   Refused Prescriptions Disp Refills   promethazine (PHENERGAN) 25 MG tablet [Pharmacy Med Name: PROMETHAZINE 25 MG TABLET 25 Tablet] 30 tablet     Sig: TAKE 1 TABLET BY MOUTH EVERY 6 HOURS AS NEEDED FOR NAUSEA AND/OR VOMITING    Refused By: Lonia Farber E    Reason for Refusal: Patient no longer under prescriber care    Date of patient request: 02/11/2020 Last office visit: 01/30/2020 Date of last refill: 12/31/2019 Last refill amount: 30 tab

## 2020-02-11 NOTE — Telephone Encounter (Signed)
Requested medication (s) are due for refill today: yes  Requested medication (s) are on the active medication list: yes  Last refill:  11/26/19   Future visit scheduled: no  Notes to clinic:  med not delegated to NT to RF   Requested Prescriptions  Pending Prescriptions Disp Refills   QUEtiapine (SEROQUEL) 100 MG tablet [Pharmacy Med Name: QUETIAPINE FUMARATE 100 MG 100 Tablet] 30 tablet     Sig: TAKE 1 TABLET BY MOUTH AT BEDTIME      Not Delegated - Psychiatry:  Antipsychotics - Second Generation (Atypical) - quetiapine Failed - 02/11/2020  2:31 PM      Failed - This refill cannot be delegated      Passed - ALT in normal range and within 180 days    ALT  Date Value Ref Range Status  02/09/2020 27 0 - 44 U/L Final          Passed - AST in normal range and within 180 days    AST  Date Value Ref Range Status  02/09/2020 21 15 - 41 U/L Final          Passed - Completed PHQ-2 or PHQ-9 in the last 360 days      Passed - Last BP in normal range    BP Readings from Last 1 Encounters:  02/09/20 126/80          Passed - Valid encounter within last 6 months    Recent Outpatient Visits           1 week ago Encounter for management and injection of depo-Provera   [facility], Gerlene Burdock, NP   1 month ago Encounter for other general counseling or advice on contraception   [facility], Gerlene Burdock, NP   5 months ago Mood disorder [facility])   Primary Care at [facility], Meda Coffee, MD   11 months ago Encounter for surveillance of injectable contraceptive   Primary Care at [facility], Meda Coffee, MD   1 year ago Encounter for surveillance of injectable contraceptive   Primary Care at [facility], Meda Coffee, MD                 Refused Prescriptions Disp Refills   promethazine (PHENERGAN) 25 MG tablet [Pharmacy Med Name: PROMETHAZINE 25 MG TABLET 25 Tablet] 30 tablet     Sig: TAKE 1 TABLET BY MOUTH EVERY 6 HOURS AS NEEDED FOR  NAUSEA AND/OR VOMITING      Not Delegated - Gastroenterology: Antiemetics Failed - 02/11/2020  2:31 PM      Failed - This refill cannot be delegated      Passed - Valid encounter within last 6 months    Recent Outpatient Visits           1 week ago Encounter for management and injection of depo-Provera   [facility], Gerlene Burdock, NP   1 month ago Encounter for other general counseling or advice on contraception   [facility], Gerlene Burdock, NP   5 months ago Mood disorder [facility]   Primary Care at [facility], Meda Coffee, MD   11 months ago Encounter for surveillance of injectable contraceptive   Primary Care at [facility], Meda Coffee, MD   1 year ago Encounter for surveillance of injectable contraceptive   Primary Care at [facility], Meda Coffee, MD

## 2020-02-12 MED FILL — QUETIAPINE FUMARATE 100 MG: 100 | 90 days supply | Qty: 90 | Fill #0

## 2020-03-07 ENCOUNTER — Telehealth: Payer: Self-pay | Admitting: Obstetrics and Gynecology

## 2020-03-07 NOTE — Telephone Encounter (Signed)
Called pt and left a mychart message and a voicemail  that this needs to be an IN PERSON appt and NOT virtual.

## 2020-03-10 ENCOUNTER — Encounter: Payer: Self-pay | Admitting: Obstetrics and Gynecology

## 2020-04-16 ENCOUNTER — Encounter: Payer: Self-pay | Admitting: Registered Nurse

## 2020-04-21 MED FILL — ONDANSETRON ODT 8 MG TABLET: 8 | 6 days supply | Qty: 20 | Fill #3

## 2020-04-28 ENCOUNTER — Ambulatory Visit (INDEPENDENT_AMBULATORY_CARE_PROVIDER_SITE_OTHER): Payer: Self-pay | Admitting: Registered Nurse

## 2020-04-28 ENCOUNTER — Encounter: Payer: Self-pay | Admitting: Registered Nurse

## 2020-04-28 ENCOUNTER — Other Ambulatory Visit: Payer: Self-pay

## 2020-04-28 VITALS — BP 126/79 | HR 92 | Temp 98.0°F | Resp 18 | Ht 65.0 in | Wt 179.0 lb

## 2020-04-28 DIAGNOSIS — Z309 Encounter for contraceptive management, unspecified: Secondary | ICD-10-CM

## 2020-04-28 DIAGNOSIS — K5900 Constipation, unspecified: Secondary | ICD-10-CM

## 2020-04-28 DIAGNOSIS — K219 Gastro-esophageal reflux disease without esophagitis: Secondary | ICD-10-CM

## 2020-04-28 DIAGNOSIS — Z3042 Encounter for surveillance of injectable contraceptive: Secondary | ICD-10-CM

## 2020-04-28 DIAGNOSIS — F1290 Cannabis use, unspecified, uncomplicated: Secondary | ICD-10-CM

## 2020-04-28 DIAGNOSIS — R112 Nausea with vomiting, unspecified: Secondary | ICD-10-CM

## 2020-04-28 MED ORDER — MEDROXYPROGESTERONE ACETATE 150 MG/ML IM SUSP
150.0000 mg | Freq: Once | INTRAMUSCULAR | Status: AC
  Administered 2020-04-28: 150 mg via INTRAMUSCULAR

## 2020-04-28 NOTE — Progress Notes (Signed)
Established Patient Office Visit  Subjective:  Patient ID: Diana Thomas, female    DOB: 1995/03/28  Age: 25 y.o. MRN: [id number]  CC:  Chief Complaint  Patient presents with  . Medication Management    Patient states she is here to discuss medications and also to received Depo birth control shot.    HPI MAURICE RAMSEUR presents for depo injection and GI referral  Depo injection: within window. Noted some weight gain but no further concerns. Hopes to continue.  Abdominal pain: generalized. Happens most days. Has not been vomiting as much lately as she has cut out dairy. Hx of cannabinoid hyperemesis syndrome, continues marijuana use. On and off constipation and diarrhea. Abdominal pain relieved by bowel movement. No blood or mucus in stool. No other changes.  Past Medical History:  Diagnosis Date  . Allergy   . Anemia   . Anxiety   . Cannabinoid hyperemesis syndrome   . Depression   . Gastritis   . GERD (gastroesophageal reflux disease)   . Heart murmur     Past Surgical History:  Procedure Laterality Date  . NO PAST SURGERIES      Family History  Problem Relation Age of Onset  . Heart disease Mother   . Hypertension Mother   . Hyperlipidemia Mother   . ADD / ADHD Father   . Healthy Father   . Healthy Sister   . Liver disease Maternal Grandmother   . Diabetes Maternal Uncle   . Colon cancer Neg Hx   . Esophageal cancer Neg Hx   . Stomach cancer Neg Hx   . Rectal cancer Neg Hx     Social History   Socioeconomic History  . Marital status: Single    Spouse name: Not on file  . Number of children: 0  . Years of education: Not on file  . Highest education level: Not on file  Occupational History  . Occupation: Charity fundraiser  Tobacco Use  . Smoking status: Never Smoker  . Smokeless tobacco: Never Used  Vaping Use  . Vaping Use: Never used  Substance and Sexual Activity  . Alcohol use: Not Currently    Comment: occasional drinker  . Drug use: Yes    Types:  Marijuana    Comment: 02/08/2020  . Sexual activity: Yes  Other Topics Concern  . Not on file  Social History Narrative  . Not on file   Social Determinants of Health   Financial Resource Strain: Not on file  Food Insecurity: Not on file  Transportation Needs: Not on file  Physical Activity: Not on file  Stress: Not on file  Social Connections: Not on file  Intimate Partner Violence: Not on file    Outpatient Medications Prior to Visit  Medication Sig Dispense Refill  . busPIRone (BUSPAR) 15 MG tablet Take 1 tablet (15 mg total) by mouth at bedtime. 90 tablet 3  . escitalopram (LEXAPRO) 20 MG tablet Take 1 tablet (20 mg total) by mouth at bedtime. 90 tablet 1  . medroxyPROGESTERone (DEPO-PROVERA) 150 MG/ML injection Inject 150 mg into the muscle every 3 (three) months.    . ondansetron (ZOFRAN ODT) 8 MG disintegrating tablet Take 1 tablet (8 mg total) by mouth every 8 (eight) hours as needed for nausea or vomiting. 6 tablet 0  . QUEtiapine (SEROQUEL) 100 MG tablet TAKE 1 TABLET BY MOUTH AT BEDTIME 90 tablet 0  . dicyclomine (BENTYL) 20 MG tablet Take 1 tablet (20 mg total) by mouth every 8 (  eight) hours as needed for spasms (Abdominal cramping). (Patient not taking: Reported on 04/28/2020) 15 tablet 0  . esomeprazole (NEXIUM) 40 MG capsule Take 40 mg by mouth daily at 12 noon. OTC (Patient not taking: Reported on 04/28/2020)    . famotidine (PEPCID) 10 MG tablet Take 1 tablet (10 mg total) by mouth 2 (two) times daily. 60 tablet 0  . hydrOXYzine (ATARAX/VISTARIL) 25 MG tablet Take 1 tablet (25 mg total) by mouth every 6 (six) hours as needed for anxiety. (Patient not taking: No sig reported) 30 tablet 0  . methylPREDNISolone (MEDROL) 4 MG tablet Take as directed on steroid taper calendar (Patient not taking: Reported on 04/28/2020) 27 tablet 0  . potassium chloride SA (KLOR-CON) 20 MEQ tablet Take 1 tablet (20 mEq total) by mouth 2 (two) times daily. (Patient not taking: Reported on  04/28/2020) 10 tablet 0  . promethazine (PHENERGAN) 25 MG suppository Place 1 suppository (25 mg total) rectally every 6 (six) hours as needed for nausea or vomiting. (Patient not taking: Reported on 04/28/2020) 10 each 0  . scopolamine (TRANSDERM-SCOP) 1 MG/3DAYS Place 1 patch onto the skin every 3 (three) days. (Patient not taking: Reported on 04/28/2020)     No facility-administered medications prior to visit.    No Known Allergies  ROS Review of Systems Per hpi     Objective:    Physical Exam Vitals and nursing note reviewed.  Constitutional:      General: She is not in acute distress.    Appearance: Normal appearance. She is normal weight. She is not ill-appearing, toxic-appearing or diaphoretic.  Cardiovascular:     Rate and Rhythm: Normal rate and regular rhythm.     Heart sounds: Normal heart sounds. No murmur heard. No friction rub. No gallop.   Pulmonary:     Effort: Pulmonary effort is normal. No respiratory distress.     Breath sounds: Normal breath sounds. No stridor. No wheezing, rhonchi or rales.  Chest:     Chest wall: No tenderness.  Abdominal:     General: Abdomen is flat. Bowel sounds are normal. There is no distension.     Palpations: Abdomen is soft. There is no mass.     Tenderness: There is no abdominal tenderness.  Skin:    General: Skin is warm and dry.  Neurological:     General: No focal deficit present.     Mental Status: She is alert and oriented to person, place, and time. Mental status is at baseline.  Psychiatric:        Mood and Affect: Mood normal.        Behavior: Behavior normal.        Thought Content: Thought content normal.        Judgment: Judgment normal.     BP 126/79   Pulse 92   Temp 98 F (36.7 C) (Temporal)   Resp 18   Ht 5\' 5"  (1.651 m)   Wt 179 lb (81.2 kg)   SpO2 98%   BMI 29.79 kg/m  Wt Readings from Last 3 Encounters:  04/28/20 179 lb (81.2 kg)  02/09/20 162 lb (73.5 kg)  01/30/20 162 lb 12.8 oz (73.8 kg)      There are no preventive care reminders to display for this patient.  There are no preventive care reminders to display for this patient.  Lab Results  Component Value Date   TSH 0.575 09/16/2017   Lab Results  Component Value Date   WBC 12.9 (H)  02/09/2020   HGB 13.6 02/09/2020   HCT 41.9 02/09/2020   MCV 87.1 02/09/2020   PLT 311 02/09/2020   Lab Results  Component Value Date   NA 139 02/09/2020   K 2.7 (LL) 02/09/2020   CO2 25 02/09/2020   GLUCOSE 115 (H) 02/09/2020   BUN 21 (H) 02/09/2020   CREATININE 0.83 02/09/2020   BILITOT 0.8 02/09/2020   ALKPHOS 63 02/09/2020   AST 21 02/09/2020   ALT 27 02/09/2020   PROT 8.8 (H) 02/09/2020   ALBUMIN 4.9 02/09/2020   CALCIUM 9.4 02/09/2020   ANIONGAP 14 02/09/2020   No results found for: CHOL No results found for: HDL No results found for: LDLCALC No results found for: TRIG No results found for: CHOLHDL No results found for: LMBE6L    Assessment & Plan:   Problem List Items Addressed This Visit      Digestive   Gastroesophageal reflux disease without esophagitis   Relevant Orders   Ambulatory referral to Gastroenterology   Cannabinoid hyperemesis syndrome   Relevant Orders   Ambulatory referral to Gastroenterology    Other Visit Diagnoses    Encounter for contraceptive management, unspecified type    -  Primary   Relevant Medications   medroxyPROGESTERone (DEPO-PROVERA) injection 150 mg (Start on 04/28/2020 10:45 AM)   Nausea and vomiting, intractability of vomiting not specified, unspecified vomiting type       Relevant Orders   Ambulatory referral to Gastroenterology   Constipation, unspecified constipation type       Relevant Orders   Ambulatory referral to Gastroenterology      Meds ordered this encounter  Medications  . medroxyPROGESTERone (DEPO-PROVERA) injection 150 mg    Follow-up: No follow-ups on file.   PLAN  Depo given, return at appropriate time  Refer to GI. Suspect IBS and  cannabinoid hyperemesis syndrome but will defer to specialist for further work up to rule out IBD, gluten intolerance, or other d/o  Patient encouraged to call clinic with any questions, comments, or concerns.  Janeece Agee, NP

## 2020-04-28 NOTE — Patient Instructions (Signed)
° ° ° °  If you have lab work done today you will be contacted with your lab results within the next 2 weeks.  If you have not heard from us then please contact us. The fastest way to get your results is to register for My Chart. ° ° °IF you received an x-ray today, you will receive an invoice from [facility]. Please contact [facility] at [phone] with questions or concerns regarding your invoice.  ° °IF you received labwork today, you will receive an invoice from [facility]. Please contact [facility] at [phone] with questions or concerns regarding your invoice.  ° °Our billing staff will not be able to assist you with questions regarding bills from these companies. ° °You will be contacted with the lab results as soon as they are available. The fastest way to get your results is to activate your My Chart account. Instructions are located on the last page of this paperwork. If you have not heard from us regarding the results in 2 weeks, please contact this office. °  ° ° ° °

## 2020-04-28 NOTE — Progress Notes (Signed)
Patient received Depo birth control in RUQ . Patient tolerated the medication well and is due for another injection 07/14/2020 - 07/28/2020

## 2020-05-23 ENCOUNTER — Other Ambulatory Visit (HOSPITAL_COMMUNITY): Payer: Self-pay

## 2020-05-30 ENCOUNTER — Other Ambulatory Visit (HOSPITAL_COMMUNITY): Payer: Self-pay

## 2020-05-30 MED ORDER — ESCITALOPRAM OXALATE 20 MG PO TABS
20.0000 mg | ORAL_TABLET | Freq: Every evening | ORAL | 0 refills | Status: DC
  Filled 2020-05-30: qty 90, 90d supply, fill #0

## 2020-05-30 MED FILL — Buspirone HCl Tab 15 MG: ORAL | 90 days supply | Qty: 90 | Fill #0 | Status: AC

## 2020-05-31 ENCOUNTER — Other Ambulatory Visit (HOSPITAL_COMMUNITY): Payer: Self-pay

## 2020-07-13 ENCOUNTER — Encounter (HOSPITAL_BASED_OUTPATIENT_CLINIC_OR_DEPARTMENT_OTHER): Payer: Self-pay

## 2020-07-13 ENCOUNTER — Emergency Department (HOSPITAL_BASED_OUTPATIENT_CLINIC_OR_DEPARTMENT_OTHER)
Admission: EM | Admit: 2020-07-13 | Discharge: 2020-07-14 | Disposition: A | Discharge status: Home / Self Care | Payer: Self-pay | Attending: Emergency Medicine | Admitting: Emergency Medicine

## 2020-07-13 DIAGNOSIS — Z5321 Procedure and treatment not carried out due to patient leaving prior to being seen by health care provider: Secondary | ICD-10-CM | POA: Insufficient documentation

## 2020-07-13 DIAGNOSIS — R112 Nausea with vomiting, unspecified: Secondary | ICD-10-CM | POA: Insufficient documentation

## 2020-07-13 DIAGNOSIS — R1013 Epigastric pain: Secondary | ICD-10-CM | POA: Insufficient documentation

## 2020-07-13 MED ORDER — ONDANSETRON HCL 4 MG/2ML IJ SOLN
4.0000 mg | Freq: Once | INTRAMUSCULAR | Status: DC | PRN
  Filled 2020-07-13: qty 2

## 2020-07-13 NOTE — ED Triage Notes (Signed)
Pt is present to the ED due to nausea/vomting since this morning. Pt states she has not been able to hold any food down. Pt also c/o epigastric pain. Pt states this usually happens when she has dairy but she has not had any dairy recently.

## 2020-07-14 ENCOUNTER — Emergency Department (HOSPITAL_COMMUNITY)
Admission: EM | Admit: 2020-07-14 | Discharge: 2020-07-14 | Disposition: A | Discharge status: Home / Self Care | Payer: Self-pay | Attending: Emergency Medicine | Admitting: Emergency Medicine

## 2020-07-14 ENCOUNTER — Emergency Department (HOSPITAL_COMMUNITY): Payer: Self-pay

## 2020-07-14 ENCOUNTER — Other Ambulatory Visit: Payer: Self-pay

## 2020-07-14 DIAGNOSIS — R1110 Vomiting, unspecified: Secondary | ICD-10-CM | POA: Insufficient documentation

## 2020-07-14 DIAGNOSIS — F12188 Cannabis abuse with other cannabis-induced disorder: Secondary | ICD-10-CM

## 2020-07-14 LAB — CBC WITH DIFFERENTIAL/PLATELET
Abs Immature Granulocytes: 0.04 10*3/uL (ref 0.00–0.07)
Basophils Absolute: 0 10*3/uL (ref 0.0–0.1)
Basophils Relative: 0 %
Eosinophils Absolute: 0 10*3/uL (ref 0.0–0.5)
Eosinophils Relative: 0 %
HCT: 41.7 % (ref 36.0–46.0)
Hemoglobin: 13.7 g/dL (ref 12.0–15.0)
Immature Granulocytes: 0 %
Lymphocytes Relative: 10 %
Lymphs Abs: 1.4 10*3/uL (ref 0.7–4.0)
MCH: 28.2 pg (ref 26.0–34.0)
MCHC: 32.9 g/dL (ref 30.0–36.0)
MCV: 86 fL (ref 80.0–100.0)
Monocytes Absolute: 0.7 10*3/uL (ref 0.1–1.0)
Monocytes Relative: 5 %
Neutro Abs: 12.3 10*3/uL — ABNORMAL HIGH (ref 1.7–7.7)
Neutrophils Relative %: 85 %
Platelets: 318 10*3/uL (ref 150–400)
RBC: 4.85 MIL/uL (ref 3.87–5.11)
RDW: 13.1 % (ref 11.5–15.5)
WBC: 14.5 10*3/uL — ABNORMAL HIGH (ref 4.0–10.5)
nRBC: 0 % (ref 0.0–0.2)

## 2020-07-14 LAB — COMPREHENSIVE METABOLIC PANEL
ALT: 22 U/L (ref 0–44)
AST: 24 U/L (ref 15–41)
Albumin: 5 g/dL (ref 3.5–5.0)
Alkaline Phosphatase: 66 U/L (ref 38–126)
Anion gap: 12 (ref 5–15)
BUN: 14 mg/dL (ref 6–20)
CO2: 23 mmol/L (ref 22–32)
Calcium: 10.1 mg/dL (ref 8.9–10.3)
Chloride: 103 mmol/L (ref 98–111)
Creatinine, Ser: 0.71 mg/dL (ref 0.44–1.00)
GFR, Estimated: >60 mL/min (ref >60)
Glucose, Bld: 136 mg/dL — ABNORMAL HIGH (ref 70–99)
Potassium: 3.3 mmol/L — ABNORMAL LOW (ref 3.5–5.1)
Sodium: 138 mmol/L (ref 135–145)
Total Bilirubin: 0.2 mg/dL — ABNORMAL LOW (ref 0.3–1.2)
Total Protein: 9 g/dL — ABNORMAL HIGH (ref 6.5–8.1)

## 2020-07-14 LAB — I-STAT BETA HCG BLOOD, ED (MC, WL, AP ONLY): I-stat hCG, quantitative: <5 m[IU]/mL (ref <5)

## 2020-07-14 LAB — LIPASE, BLOOD: Lipase: 24 U/L (ref 11–51)

## 2020-07-14 MED ORDER — CAPSAICIN 0.025 % EX CREA: TOPICAL_CREAM | Freq: Two times a day (BID) | CUTANEOUS | 0 refills | Status: DC

## 2020-07-14 MED ORDER — HALOPERIDOL LACTATE 5 MG/ML IJ SOLN
5.0000 mg | Freq: Once | INTRAMUSCULAR | Status: AC
  Administered 2020-07-14: 5 mg via INTRAMUSCULAR
  Filled 2020-07-14: qty 1

## 2020-07-14 MED ORDER — LORAZEPAM 2 MG/ML IJ SOLN
1.0000 mg | Freq: Once | INTRAMUSCULAR | Status: AC
  Administered 2020-07-14: 1 mg via INTRAVENOUS
  Filled 2020-07-14: qty 1

## 2020-07-14 MED ORDER — LACTATED RINGERS IV SOLN: INTRAVENOUS | Status: DC

## 2020-07-14 MED ORDER — LACTATED RINGERS IV BOLUS
2000.0000 mL | Freq: Once | INTRAVENOUS | Status: AC
  Administered 2020-07-14: 2000 mL via INTRAVENOUS

## 2020-07-14 NOTE — ED Provider Notes (Signed)
[facility]-EMERGENCY DEPT Provider Note   CSN: [id number] Arrival date & time: 07/14/20  [id number]     History Chief Complaint  Patient presents with  . Emesis    Diana Thomas is a 25 y.o. female.  25 year old female with history of cannabinoid hyperemesis syndrome who presents with emesis x24 hours.  Has also had epigastric pain which goes into her chest.  Denies any fever or chills.  No urinary or vaginal complaints.  Emesis has been nonbilious.  Patient states he uses about 5 blunts a day.  No treatment use prior to arrival        Past Medical History:  Diagnosis Date  . Allergy   . Anemia   . Anxiety   . Cannabinoid hyperemesis syndrome   . Depression   . Gastritis   . GERD (gastroesophageal reflux disease)   . Heart murmur     Patient Active Problem List   Diagnosis Date Noted  . Cannabinoid hyperemesis syndrome 12/12/2019  . Moderate episode of recurrent major depressive disorder (HCC) 04/29/2018  . Gastroesophageal reflux disease without esophagitis 04/29/2018  . Generalized anxiety disorder 09/16/2017    Past Surgical History:  Procedure Laterality Date  . NO PAST SURGERIES       OB History    Gravida  1   Para      Term      Preterm      AB      Living        SAB      IAB      Ectopic      Multiple      Live Births              Family History  Problem Relation Age of Onset  . Heart disease Mother   . Hypertension Mother   . Hyperlipidemia Mother   . ADD / ADHD Father   . Healthy Father   . Healthy Sister   . Liver disease Maternal Grandmother   . Diabetes Maternal Uncle   . Colon cancer Neg Hx   . Esophageal cancer Neg Hx   . Stomach cancer Neg Hx   . Rectal cancer Neg Hx     Social History   Tobacco Use  . Smoking status: Never Smoker  . Smokeless tobacco: Never Used  Vaping Use  . Vaping Use: Never used  Substance Use Topics  . Alcohol use: Not Currently    Comment: occasional drinker   . Drug use: Yes    Types: Marijuana    Comment: 02/08/2020    Home Medications Prior to Admission medications   Medication Sig Start Date End Date Taking? Authorizing Provider  busPIRone (BUSPAR) 15 MG tablet TAKE 1 TABLET BY MOUTH AT BEDTIME 08/28/19 08/29/20  Lezlie Lye, Meda Coffee, MD  dicyclomine (BENTYL) 20 MG tablet Take 1 tablet (20 mg total) by mouth every 8 (eight) hours as needed for spasms (Abdominal cramping). Patient not taking: Reported on 04/28/2020 05/31/19   Ward, Layla Maw, DO  escitalopram (LEXAPRO) 20 MG tablet TAKE 1 TABLET BY MOUTH AT BEDTIME 08/28/19 08/27/20  Lezlie Lye, Meda Coffee, MD  escitalopram (LEXAPRO) 20 MG tablet Take 1 tablet (20 mg total) by mouth at bedtime. 08/28/19     esomeprazole (NEXIUM) 40 MG capsule Take 40 mg by mouth daily at 12 noon. OTC Patient not taking: Reported on 04/28/2020    [provider]  famotidine (PEPCID) 10 MG tablet Take 1 tablet (10  mg total) by mouth 2 (two) times daily. 12/03/19 01/02/20  Clayton Bibles C, CNM  hydrOXYzine (ATARAX/VISTARIL) 25 MG tablet TAKE 1 TABLET (25 MG TOTAL) BY MOUTH EVERY SIX HOURS AS NEEDED FOR ANXIETY. Patient not taking: No sig reported 12/17/19 12/16/20  Constant, Peggy, MD  medroxyPROGESTERone (DEPO-PROVERA) 150 MG/ML injection Inject 150 mg into the muscle every 3 (three) months.    [provider]  methylPREDNISolone (MEDROL) 4 MG tablet TAKE AS DIRECTED ON STEROID TAPER CALENDAR Patient not taking: Reported on 04/28/2020 12/17/19 12/16/20  Dillingham Bing, MD  ondansetron (ZOFRAN ODT) 8 MG disintegrating tablet Take 1 tablet (8 mg total) by mouth every 8 (eight) hours as needed for nausea or vomiting. 02/09/20   Curatolo, Adam, DO  potassium chloride SA (KLOR-CON) 20 MEQ tablet Take 1 tablet (20 mEq total) by mouth 2 (two) times daily. Patient not taking: Reported on 04/28/2020 02/09/20   Virgina Norfolk, DO  promethazine (PHENERGAN) 25 MG suppository Place 1 suppository (25 mg total)  rectally every 6 (six) hours as needed for nausea or vomiting. Patient not taking: Reported on 04/28/2020 02/09/20   Virgina Norfolk, DO  QUEtiapine (SEROQUEL) 100 MG tablet TAKE 1 TABLET BY MOUTH AT BEDTIME 02/11/20 02/10/21  Janeece Agee, NP  scopolamine (TRANSDERM-SCOP) 1 MG/3DAYS Place 1 patch onto the skin every 3 (three) days. Patient not taking: Reported on 04/28/2020    [provider]  scopolamine (TRANSDERM-SCOP) 1 MG/3DAYS PLACE 1 PATCH ONTO THE SKIN EVERY 3 DAYS FOR 10 DOSES. Patient not taking: Reported on 12/24/2019 12/03/19 12/02/20  Calvert Cantor, CNM  pantoprazole (PROTONIX) 40 MG tablet Take 1-2 tablets (40-80 mg total) by mouth daily. Patient not taking: No sig reported 10/12/18 02/09/20  Lezlie Lye, Meda Coffee, MD  prochlorperazine (COMPAZINE) 10 MG/2ML injection Inject 2 mLs (10 mg total) into the vein every 6 (six) hours as needed. Patient not taking: No sig reported 12/17/19 02/09/20  Constant, Peggy, MD    Allergies    Patient has no known allergies.  Review of Systems   Review of Systems  All other systems reviewed and are negative.   Physical Exam Updated Vital Signs BP (!) 141/100 (BP Location: Left Arm)   Pulse (!) 58   Temp 98.3 F (36.8 C) (Oral)   Resp 16   Ht 1.651 m (5\' 5" )   Wt 81.6 kg   SpO2 100%   BMI 29.95 kg/m   Physical Exam Vitals and nursing note reviewed.  Constitutional:      General: She is not in acute distress.    Appearance: Normal appearance. She is well-developed. She is not toxic-appearing.  HENT:     Head: Normocephalic and atraumatic.  Eyes:     General: Lids are normal.     Conjunctiva/sclera: Conjunctivae normal.     Pupils: Pupils are equal, round, and reactive to light.  Neck:     Thyroid: No thyroid mass.     Trachea: No tracheal deviation.  Cardiovascular:     Rate and Rhythm: Normal rate and regular rhythm.     Heart sounds: Normal heart sounds. No murmur heard. No gallop.   Pulmonary:      Effort: Pulmonary effort is normal. No respiratory distress.     Breath sounds: Normal breath sounds. No stridor. No decreased breath sounds, wheezing, rhonchi or rales.  Abdominal:     General: Bowel sounds are normal. There is no distension.     Palpations: Abdomen is soft.     Tenderness: There  is abdominal tenderness in the epigastric area. There is no rebound.    Musculoskeletal:        General: No tenderness. Normal range of motion.     Cervical back: Normal range of motion and neck supple.  Skin:    General: Skin is warm and dry.     Findings: No abrasion or rash.  Neurological:     Mental Status: She is alert and oriented to person, place, and time.     GCS: GCS eye subscore is 4. GCS verbal subscore is 5. GCS motor subscore is 6.     Cranial Nerves: No cranial nerve deficit.     Sensory: No sensory deficit.  Psychiatric:        Speech: Speech normal.        Behavior: Behavior normal.     ED Results / Procedures / Treatments   Labs (all labs ordered are listed, but only abnormal results are displayed) Labs Reviewed  CBC WITH DIFFERENTIAL/PLATELET  COMPREHENSIVE METABOLIC PANEL  LIPASE, BLOOD  I-STAT BETA HCG BLOOD, ED ([facility], [facility], [facility] ONLY)    EKG None  Radiology No results found.  Procedures Procedures   Medications Ordered in ED Medications  haloperidol lactate (HALDOL) injection 5 mg (has no administration in time range)  lactated ringers bolus 2,000 mL (has no administration in time range)  lactated ringers infusion (has no administration in time range)  LORazepam (ATIVAN) injection 1 mg (has no administration in time range)    ED Course  I have reviewed the triage vital signs and the nursing notes.  Pertinent labs & imaging results that were available during my care of the patient were reviewed by me and considered in my medical decision making (see chart for details).    MDM Rules/Calculators/A&P                          Patient given IV fluids  along with Haldol and feels better.  Also medicate with Ativan.  Patient symptoms are controlled at this point.  Acute abdominal series negative.  Will discharge home Final Clinical Impression(s) / ED Diagnoses Final diagnoses:  None    Rx / DC Orders ED Discharge Orders    None       Lorre Nick, MD 07/14/20 [phone]

## 2020-07-14 NOTE — ED Triage Notes (Signed)
Arrived via POV from home with c/o n/v since yesterday and "chest pressure". Pt reports the n/v is related to [facility] use and she went to [facility] yesterday 5/29 but left before being seen.

## 2020-07-14 NOTE — Discharge Instructions (Signed)
Use the capsaicin cream for nausea as directed.

## 2020-07-15 ENCOUNTER — Encounter (HOSPITAL_COMMUNITY): Payer: Self-pay | Admitting: Emergency Medicine

## 2020-07-15 ENCOUNTER — Emergency Department (HOSPITAL_COMMUNITY)
Admission: EM | Admit: 2020-07-15 | Discharge: 2020-07-15 | Disposition: A | Discharge status: Home / Self Care | Payer: Self-pay | Attending: Emergency Medicine | Admitting: Emergency Medicine

## 2020-07-15 DIAGNOSIS — R1115 Cyclical vomiting syndrome unrelated to migraine: Secondary | ICD-10-CM | POA: Insufficient documentation

## 2020-07-15 DIAGNOSIS — R1013 Epigastric pain: Secondary | ICD-10-CM | POA: Insufficient documentation

## 2020-07-15 DIAGNOSIS — E876 Hypokalemia: Secondary | ICD-10-CM | POA: Insufficient documentation

## 2020-07-15 LAB — CBC
HCT: 39.2 % (ref 36.0–46.0)
Hemoglobin: 12.7 g/dL (ref 12.0–15.0)
MCH: 28.2 pg (ref 26.0–34.0)
MCHC: 32.4 g/dL (ref 30.0–36.0)
MCV: 86.9 fL (ref 80.0–100.0)
Platelets: 288 10*3/uL (ref 150–400)
RBC: 4.51 MIL/uL (ref 3.87–5.11)
RDW: 13.2 % (ref 11.5–15.5)
WBC: 14.6 10*3/uL — ABNORMAL HIGH (ref 4.0–10.5)
nRBC: 0 % (ref 0.0–0.2)

## 2020-07-15 LAB — COMPREHENSIVE METABOLIC PANEL
ALT: 21 U/L (ref 0–44)
AST: 26 U/L (ref 15–41)
Albumin: 4.5 g/dL (ref 3.5–5.0)
Alkaline Phosphatase: 55 U/L (ref 38–126)
Anion gap: 11 (ref 5–15)
BUN: 13 mg/dL (ref 6–20)
CO2: 25 mmol/L (ref 22–32)
Calcium: 9.2 mg/dL (ref 8.9–10.3)
Chloride: 105 mmol/L (ref 98–111)
Creatinine, Ser: 0.81 mg/dL (ref 0.44–1.00)
GFR, Estimated: >60 mL/min (ref >60)
Glucose, Bld: 141 mg/dL — ABNORMAL HIGH (ref 70–99)
Potassium: 3 mmol/L — ABNORMAL LOW (ref 3.5–5.1)
Sodium: 141 mmol/L (ref 135–145)
Total Bilirubin: 0.6 mg/dL (ref 0.3–1.2)
Total Protein: 8.2 g/dL — ABNORMAL HIGH (ref 6.5–8.1)

## 2020-07-15 LAB — LIPASE, BLOOD: Lipase: 30 U/L (ref 11–51)

## 2020-07-15 LAB — MAGNESIUM: Magnesium: 2 mg/dL (ref 1.7–2.4)

## 2020-07-15 MED ORDER — DIPHENHYDRAMINE HCL 50 MG/ML IJ SOLN
12.5000 mg | Freq: Once | INTRAMUSCULAR | Status: AC
  Administered 2020-07-15: 12.5 mg via INTRAVENOUS
  Filled 2020-07-15: qty 1

## 2020-07-15 MED ORDER — FAMOTIDINE IN NACL 20-0.9 MG/50ML-% IV SOLN
20.0000 mg | Freq: Once | INTRAVENOUS | Status: AC
  Administered 2020-07-15: 20 mg via INTRAVENOUS
  Filled 2020-07-15: qty 50

## 2020-07-15 MED ORDER — POTASSIUM CHLORIDE CRYS ER 20 MEQ PO TBCR: 20.0000 meq | EXTENDED_RELEASE_TABLET | Freq: Two times a day (BID) | ORAL | 0 refills | Status: DC

## 2020-07-15 MED ORDER — METOCLOPRAMIDE HCL 5 MG/ML IJ SOLN
10.0000 mg | Freq: Once | INTRAMUSCULAR | Status: AC
  Administered 2020-07-15: 10 mg via INTRAVENOUS
  Filled 2020-07-15: qty 2

## 2020-07-15 MED ORDER — LORAZEPAM 2 MG/ML IJ SOLN
1.0000 mg | Freq: Once | INTRAMUSCULAR | Status: AC
  Administered 2020-07-15: 1 mg via INTRAVENOUS
  Filled 2020-07-15: qty 1

## 2020-07-15 MED ORDER — DROPERIDOL 2.5 MG/ML IJ SOLN
2.5000 mg | Freq: Once | INTRAMUSCULAR | Status: AC
  Administered 2020-07-15: 2.5 mg via INTRAVENOUS
  Filled 2020-07-15: qty 2

## 2020-07-15 MED ORDER — PROCHLORPERAZINE EDISYLATE 10 MG/2ML IJ SOLN
10.0000 mg | Freq: Once | INTRAMUSCULAR | Status: AC
  Administered 2020-07-15: 10 mg via INTRAVENOUS
  Filled 2020-07-15: qty 2

## 2020-07-15 MED ORDER — LORAZEPAM 1 MG PO TABS: 1.0000 mg | ORAL_TABLET | Freq: Four times a day (QID) | ORAL | 0 refills | Status: DC | PRN

## 2020-07-15 MED ORDER — LACTATED RINGERS IV BOLUS
1000.0000 mL | Freq: Once | INTRAVENOUS | Status: AC
  Administered 2020-07-15: 1000 mL via INTRAVENOUS

## 2020-07-15 NOTE — ED Notes (Signed)
Pt was educated about leaving on blood pressure cuff for vitals, pt did not wear blood pressure cuff.

## 2020-07-15 NOTE — ED Provider Notes (Signed)
[facility]-EMERGENCY DEPT Provider Note   CSN: [id number] Arrival date & time: 07/15/20  0045     History Chief Complaint  Patient presents with  . Emesis    Diana Thomas is a 25 y.o. female with a history of anxiety, depression, GERD, cyclical vomiting, cannabis use who presents to the emergency department with a chief complaint of vomiting.  This is the patient's second visit to the emergency department today.  She also presented to the ER yesterday, but left before being evaluated.  She reports countless episodes of vomiting, onset 3 days ago.  Reports that she has been unable to keep down any food or even sips of water.  She does report that she has voided 2 times within the last 24 hours.  She reports associated cramping epigastric abdominal pain.  She does report the symptoms feel similar to previous episodes of cyclical vomiting, but she does note that previously symptoms improved with hot showers.  She attempted to take warm showers 4 times today with no improvement in her symptoms.  Earlier today when she was evaluated in the ER, she was given IV fluids and labs were obtained.  She was given Ativan and Haldol.  She had an unremarkable abdominal x-ray.  Symptoms seemed improved, but several hours after discharge, vomiting returned.  She estimates more than 20 episodes of vomiting since her last ER visit.  Of note, she does note that she did smoke cannabis after her ER visit today.   She does state that emesis looked dark this morning, but denies hematemesis.  She has been feeling increasingly weak throughout the day since her symptoms have worsened.  She is endorsing some burning pain radiating up the center of her chest, but denies shortness of breath, palpitations, diaphoresis, fever, chills, dysuria, hematuria, vaginal discharge or bleeding, pelvic pain, flank pain, back pain, dizziness, syncope.  She denies any other illicit or recreational substance  use.  She reports that she feels that she might be able to manage her symptoms at home if she is discharged home with the medication.  She reports that she has several ER visits for similar symptoms last year over the course of several days and was finally able to break the vomiting cycle after she was discharged home with a short course of p.o. Haldol.  The history is provided by the patient and medical records. No language interpreter was used.       Past Medical History:  Diagnosis Date  . Allergy   . Anemia   . Anxiety   . Cannabinoid hyperemesis syndrome   . Depression   . Gastritis   . GERD (gastroesophageal reflux disease)   . Heart murmur     Patient Active Problem List   Diagnosis Date Noted  . Cannabinoid hyperemesis syndrome 12/12/2019  . Moderate episode of recurrent major depressive disorder (HCC) 04/29/2018  . Gastroesophageal reflux disease without esophagitis 04/29/2018  . Generalized anxiety disorder 09/16/2017    Past Surgical History:  Procedure Laterality Date  . NO PAST SURGERIES       OB History    Gravida  1   Para      Term      Preterm      AB      Living        SAB      IAB      Ectopic      Multiple      Live Births  Family History  Problem Relation Age of Onset  . Heart disease Mother   . Hypertension Mother   . Hyperlipidemia Mother   . ADD / ADHD Father   . Healthy Father   . Healthy Sister   . Liver disease Maternal Grandmother   . Diabetes Maternal Uncle   . Colon cancer Neg Hx   . Esophageal cancer Neg Hx   . Stomach cancer Neg Hx   . Rectal cancer Neg Hx     Social History   Tobacco Use  . Smoking status: Never Smoker  . Smokeless tobacco: Never Used  Vaping Use  . Vaping Use: Never used  Substance Use Topics  . Alcohol use: Not Currently    Comment: occasional drinker  . Drug use: Yes    Types: Marijuana    Comment: 02/08/2020    Home Medications Prior to Admission medications    Medication Sig Start Date End Date Taking? Authorizing Provider  LORazepam (ATIVAN) 1 MG tablet Take 1 tablet (1 mg total) by mouth every 6 (six) hours as needed for anxiety. 07/15/20  Yes Corky Blumstein A, PA-C  potassium chloride SA (KLOR-CON) 20 MEQ tablet Take 1 tablet (20 mEq total) by mouth 2 (two) times daily for 5 days. 07/15/20 07/20/20 Yes Attie Nawabi A, PA-C  busPIRone (BUSPAR) 15 MG tablet TAKE 1 TABLET BY MOUTH AT BEDTIME 08/28/19 08/29/20  Lezlie Lye, Meda Coffee, MD  capsaicin (ZOSTRIX) 0.025 % cream Apply topically 2 (two) times daily. 07/14/20   Lorre Nick, MD  escitalopram (LEXAPRO) 20 MG tablet TAKE 1 TABLET BY MOUTH AT BEDTIME 08/28/19 08/27/20  Lezlie Lye, Meda Coffee, MD  escitalopram (LEXAPRO) 20 MG tablet Take 1 tablet (20 mg total) by mouth at bedtime. 08/28/19     famotidine (PEPCID) 10 MG tablet Take 1 tablet (10 mg total) by mouth 2 (two) times daily. 12/03/19 01/02/20  Calvert Cantor, CNM  medroxyPROGESTERone (DEPO-PROVERA) 150 MG/ML injection Inject 150 mg into the muscle every 3 (three) months.    [provider]  ondansetron (ZOFRAN ODT) 8 MG disintegrating tablet Take 1 tablet (8 mg total) by mouth every 8 (eight) hours as needed for nausea or vomiting. 02/09/20   Curatolo, Adam, DO  QUEtiapine (SEROQUEL) 100 MG tablet TAKE 1 TABLET BY MOUTH AT BEDTIME 02/11/20 02/10/21  Janeece Agee, NP  dicyclomine (BENTYL) 20 MG tablet Take 1 tablet (20 mg total) by mouth every 8 (eight) hours as needed for spasms (Abdominal cramping). Patient not taking: Reported on 04/28/2020 05/31/19 07/15/20  Ward, Layla Maw, DO  esomeprazole (NEXIUM) 40 MG capsule Take 40 mg by mouth daily at 12 noon. OTC Patient not taking: Reported on 04/28/2020  07/15/20  [provider]  pantoprazole (PROTONIX) 40 MG tablet Take 1-2 tablets (40-80 mg total) by mouth daily. Patient not taking: No sig reported 10/12/18 02/09/20  Lezlie Lye, Meda Coffee, MD  prochlorperazine (COMPAZINE) 10  MG/2ML injection Inject 2 mLs (10 mg total) into the vein every 6 (six) hours as needed. Patient not taking: No sig reported 12/17/19 02/09/20  Constant, Peggy, MD  promethazine (PHENERGAN) 25 MG suppository Place 1 suppository (25 mg total) rectally every 6 (six) hours as needed for nausea or vomiting. Patient not taking: Reported on 04/28/2020 02/09/20 07/15/20  Virgina Norfolk, DO    Allergies    Patient has no known allergies.  Review of Systems   Review of Systems  Constitutional: Negative for activity change, chills, diaphoresis and fever.  HENT: Negative for congestion  and sore throat.   Respiratory: Negative for cough, choking, shortness of breath and wheezing.   Cardiovascular: Positive for chest pain. Negative for palpitations and leg swelling.  Gastrointestinal: Positive for abdominal pain, nausea and vomiting. Negative for blood in stool, constipation and diarrhea.  Genitourinary: Negative for dysuria, flank pain, pelvic pain, urgency, vaginal bleeding, vaginal discharge and vaginal pain.  Musculoskeletal: Negative for back pain.  Skin: Negative for rash.  Allergic/Immunologic: Negative for immunocompromised state.  Neurological: Positive for weakness. Negative for seizures, syncope, numbness and headaches.  Psychiatric/Behavioral: Negative for confusion.    Physical Exam Updated Vital Signs BP (!) 164/82   Pulse 70   Temp 98.3 F (36.8 C) (Oral)   Resp 20   Ht 5\' 5"  (1.651 m)   Wt 81.6 kg   SpO2 97%   BMI 29.95 kg/m   Physical Exam Vitals and nursing note reviewed.  Constitutional:      General: She is not in acute distress.    Comments: Actively vomiting  HENT:     Head: Normocephalic.     Mouth/Throat:     Mouth: Mucous membranes are moist.  Eyes:     Conjunctiva/sclera: Conjunctivae normal.  Cardiovascular:     Rate and Rhythm: Normal rate and regular rhythm.     Pulses: Normal pulses.     Heart sounds: Normal heart sounds. No murmur heard. No friction  rub. No gallop.   Pulmonary:     Effort: Pulmonary effort is normal. No respiratory distress.     Breath sounds: No stridor. No wheezing, rhonchi or rales.  Chest:     Chest wall: No tenderness.  Abdominal:     General: There is no distension.     Palpations: Abdomen is soft. There is no mass.     Tenderness: There is abdominal tenderness. There is no right CVA tenderness, left CVA tenderness, guarding or rebound.     Hernia: No hernia is present.     Comments: Tender to palpation in the epigastric region without rebound or guarding.  Musculoskeletal:     Cervical back: Neck supple.     Right lower leg: No edema.     Left lower leg: No edema.  Skin:    General: Skin is warm.     Capillary Refill: Capillary refill takes less than 2 seconds.     Findings: No rash.  Neurological:     Mental Status: She is alert.  Psychiatric:        Behavior: Behavior normal.     ED Results / Procedures / Treatments   Labs (all labs ordered are listed, but only abnormal results are displayed) Labs Reviewed  CBC - Abnormal; Notable for the following components:      Result Value   WBC 14.6 (*)    All other components within normal limits  COMPREHENSIVE METABOLIC PANEL - Abnormal; Notable for the following components:   Potassium 3.0 (*)    Glucose, Bld 141 (*)    Total Protein 8.2 (*)    All other components within normal limits  LIPASE, BLOOD  MAGNESIUM  URINALYSIS, ROUTINE W REFLEX MICROSCOPIC    EKG None  Radiology DG ABD ACUTE 2+V W 1V CHEST  Result Date: 07/14/2020 CLINICAL DATA:  Abdominal pain, nausea/vomiting EXAM: DG ABDOMEN ACUTE WITH 1 VIEW CHEST COMPARISON:  None. FINDINGS: Lungs are clear.  No pleural effusion or pneumothorax. The heart is normal in size. Nonobstructive bowel gas pattern. No evidence of free air under the diaphragm on  the upright view. Visualized osseous structures are within normal limits. IMPRESSION: No evidence of acute cardiopulmonary disease. No  evidence of small bowel obstruction or free air. Electronically Signed   By: Charline Bills M.D.   On: 07/14/2020 10:57    Procedures Procedures   Medications Ordered in ED Medications  lactated ringers bolus 1,000 mL (0 mLs Intravenous Stopped 07/15/20 0322)  metoCLOPramide (REGLAN) injection 10 mg (10 mg Intravenous Given 07/15/20 0159)  droperidol (INAPSINE) 2.5 MG/ML injection 2.5 mg (2.5 mg Intravenous Given 07/15/20 0230)  famotidine (PEPCID) IVPB 20 mg premix (0 mg Intravenous Stopped 07/15/20 0340)  prochlorperazine (COMPAZINE) injection 10 mg (10 mg Intravenous Given 07/15/20 0400)  diphenhydrAMINE (BENADRYL) injection 12.5 mg (12.5 mg Intravenous Given 07/15/20 0359)  lactated ringers bolus 1,000 mL (0 mLs Intravenous Stopped 07/15/20 0606)  LORazepam (ATIVAN) injection 1 mg (1 mg Intravenous Given 07/15/20 0501)    ED Course  I have reviewed the triage vital signs and the nursing notes.  Pertinent labs & imaging results that were available during my care of the patient were reviewed by me and considered in my medical decision making (see chart for details).  Clinical Course as of 07/15/20 0723  Tue Jul 15, 2020  [id number] Patient rechecked.  She has had no more vomiting since Ativan.  She is ready to attempt fluid challenge. [MM]    Clinical Course User Index [MM] Ellison Leisure, Coral Else, PA-C   MDM Rules/Calculators/A&P                          25 year old female with a history of anxiety, depression, GERD, cyclical vomiting, cannabis use who presents to the emergency department for her second visit today.  She was seen for vomiting earlier today and vomiting resolved, but unfortunately she went home and smoked marijuana and vomiting returned.  She has a history of cannabis hyper emesis syndrome.  Vital signs are stable.  Abdomen with tenderness palpation in the epigastric region but no rebound or guarding.  Benign exam.  Given countless episodes of vomiting since her last ER visit, will  recheck labs.  Labs of been reviewed and independently interpreted by me.  Potassium was 3.0.  Will discharge with short course of potassium chloride.  She has a leukocytosis, likely reactive and unchanged since yesterday.  EKG with sinus rhythm.  Suspect chest pain is secondary to vomiting.    Differential diagnosis includes pancreatitis, cholecystitis, bowel obstruction, pyelonephritis, gastroenteritis, mesenteric ischemia.  Initially trialed the patient with droperidol and Reglan and on reevaluation the patient was actively vomiting at bedside.  Pepcid for pain control.  Trialed Compazine and Benadryl, but patient was found to be actively vomiting at bedside on second reevaluation.  States that Ativan has previously helped with vomiting.  After she was given Ativan, patient was no longer vomiting.  She was successfully fluid challenged.  I did review her records and saw that a year ago she was able to have her symptoms subside with Haldol and was discharged home with a short course.  Will discharge the patient home with a short course of Ativan.  She has been made aware that this is a one-time prescription to help her with her symptoms.  We discussed cannabis cessation.  ER return precautions given.  She is hemodynamically stable in no acute distress. Safe for discharge home with outpatient follow-up as discussed.    Final Clinical Impression(s) / ED Diagnoses Final diagnoses:  Cyclical vomiting  Hypokalemia  Rx / DC Orders ED Discharge Orders         Ordered    LORazepam (ATIVAN) 1 MG tablet  Every 6 hours PRN        07/15/20 0657    potassium chloride SA (KLOR-CON) 20 MEQ tablet  2 times daily        07/15/20 0718           Barkley Boards, PA-C 07/15/20 0723    Sabas Sous, MD 07/15/20 [id number]

## 2020-07-15 NOTE — ED Notes (Addendum)
Patient ambulatory to restroom with steady gait and no assistance, no vomiting since ativan given.

## 2020-07-15 NOTE — Discharge Instructions (Signed)
Thank you for allowing me to care for you today in the Emergency Department.   You can take 1 tablet of Ativan every 6-8 hours as needed for nausea and vomiting.  Please stop using cannabis and marijuana entirely as this is likely contributing to your symptoms.  Please follow-up with your primary care provider this week for recheck.  Return to the emergency department if you stop producing urine, if you pass out, if you develop black or bloody stool or vomiting, fever with severe abdominal pain, or other new, concerning symptoms.

## 2020-07-15 NOTE — ED Triage Notes (Signed)
Pt reports vomiting. She has sought emergency care for same the last 3 days. Hx of cannabinoid hyperemesis.

## 2020-07-16 ENCOUNTER — Other Ambulatory Visit: Payer: Self-pay | Admitting: Registered Nurse

## 2020-07-16 ENCOUNTER — Other Ambulatory Visit (HOSPITAL_COMMUNITY): Payer: Self-pay

## 2020-07-16 DIAGNOSIS — R112 Nausea with vomiting, unspecified: Secondary | ICD-10-CM

## 2020-07-16 NOTE — Telephone Encounter (Signed)
Quetipine LFD 02/11/20 # 90 with no refills Zofran LFD 02/09/20 #6 with no refills LOV 04/28/20 NOV none

## 2020-07-17 ENCOUNTER — Encounter: Payer: Self-pay | Admitting: Registered Nurse

## 2020-07-17 ENCOUNTER — Telehealth (INDEPENDENT_AMBULATORY_CARE_PROVIDER_SITE_OTHER): Payer: Self-pay | Admitting: Registered Nurse

## 2020-07-17 ENCOUNTER — Other Ambulatory Visit: Payer: Self-pay

## 2020-07-17 DIAGNOSIS — R1115 Cyclical vomiting syndrome unrelated to migraine: Secondary | ICD-10-CM

## 2020-07-17 MED ORDER — PROMETHAZINE HCL 12.5 MG PO TABS
12.5000 mg | ORAL_TABLET | Freq: Three times a day (TID) | ORAL | 0 refills | Status: DC | PRN
Start: 2020-07-17 — End: 2020-10-29

## 2020-07-17 MED ORDER — ALPRAZOLAM 0.5 MG PO TBDP: 0.5000 mg | ORAL_TABLET | Freq: Two times a day (BID) | ORAL | 0 refills | Status: DC | PRN

## 2020-07-17 MED ORDER — QUETIAPINE FUMARATE 100 MG PO TABS: ORAL_TABLET | Freq: Every day | ORAL | 0 refills | Status: DC

## 2020-07-17 NOTE — Patient Instructions (Signed)
Ms Mcclory -  Sorry that you're not feeling well.   I've sent the medications to [facility] at [address]  In addition, the number for the GI office is [phone]  Feel free to contact them at your convenience  Thank you  Luan Pulling

## 2020-07-17 NOTE — Progress Notes (Signed)
Telemedicine Encounter- SOAP NOTE Established Patient  This telephone encounter was conducted with the patient's (or proxy's) verbal consent via audio telecommunications: yes  Patient was instructed to have this encounter in a suitably private space; and to only have persons present to whom they give permission to participate. In addition, patient identity was confirmed by use of name plus two identifiers (DOB and address).  I discussed the limitations, risks, security and privacy concerns of performing an evaluation and management service by telephone and the availability of in person appointments. I also discussed with the patient that there may be a patient responsible charge related to this service. The patient expressed understanding and agreed to proceed.  I spent a total of 15 minutes talking with the patient or their proxy.  Patient at home Provider in office  Participants: Diana Sportsman, NP and Diana Thomas  Chief Complaint  Patient presents with  . Vomiting    Patient states she has been vomiting for the the last few days. She states that she was seen by the ER twice and know that this is just a mental issue and she need her Seroquel. She has been unable to even keeps ice down and is feeling so bad that she wishes she would just die right now.    Subjective   Diana Thomas is a 25 y.o. established patient. Telephone visit today for nv  HPI Ongoing 3-4 days Seen by ED twice Given fluids, haldol, lorazepam, potassium po Unfortunately wasn't able to take any of these at home as she vomited each pill  Interested in restarting seroquel and phenergan, which have helped in the past Very anxious as she has not had much to eat or drink since Saturday Hx of cyclic vomiting vs cannabinoid hyperemesis as well as lactose intolerance and likely IBS.  Has been referred to gastro, states that she has not yet been contacted.  Patient Active Problem List   Diagnosis Date Noted  .  Cannabinoid hyperemesis syndrome 12/12/2019  . Moderate episode of recurrent major depressive disorder (HCC) 04/29/2018  . Gastroesophageal reflux disease without esophagitis 04/29/2018  . Generalized anxiety disorder 09/16/2017    Past Medical History:  Diagnosis Date  . Allergy   . Anemia   . Anxiety   . Cannabinoid hyperemesis syndrome   . Depression   . Gastritis   . GERD (gastroesophageal reflux disease)   . Heart murmur     Current Outpatient Medications  Medication Sig Dispense Refill  . ALPRAZolam (NIRAVAM) 0.5 MG dissolvable tablet Take 1 tablet (0.5 mg total) by mouth 2 (two) times daily as needed for anxiety. 30 tablet 0  . busPIRone (BUSPAR) 15 MG tablet TAKE 1 TABLET BY MOUTH AT BEDTIME 90 tablet 3  . capsaicin (ZOSTRIX) 0.025 % cream Apply topically 2 (two) times daily. 60 g 0  . escitalopram (LEXAPRO) 20 MG tablet TAKE 1 TABLET BY MOUTH AT BEDTIME 90 tablet 1  . escitalopram (LEXAPRO) 20 MG tablet Take 1 tablet (20 mg total) by mouth at bedtime. 90 tablet 0  . LORazepam (ATIVAN) 1 MG tablet Take 1 tablet (1 mg total) by mouth every 6 (six) hours as needed for anxiety. 8 tablet 0  . medroxyPROGESTERone (DEPO-PROVERA) 150 MG/ML injection Inject 150 mg into the muscle every 3 (three) months.    . ondansetron (ZOFRAN ODT) 8 MG disintegrating tablet Take 1 tablet (8 mg total) by mouth every 8 (eight) hours as needed for nausea or vomiting. 6 tablet 0  .  promethazine (PHENERGAN) 12.5 MG tablet Take 1 tablet (12.5 mg total) by mouth every 8 (eight) hours as needed for nausea or vomiting. 20 tablet 0  . QUEtiapine (SEROQUEL) 100 MG tablet TAKE 1 TABLET BY MOUTH AT BEDTIME 90 tablet 0  . famotidine (PEPCID) 10 MG tablet Take 1 tablet (10 mg total) by mouth 2 (two) times daily. 60 tablet 0  . potassium chloride SA (KLOR-CON) 20 MEQ tablet Take 1 tablet (20 mEq total) by mouth 2 (two) times daily for 5 days. (Patient not taking: Reported on 07/17/2020) 10 tablet 0   No current  facility-administered medications for this visit.    No Known Allergies  Social History   Socioeconomic History  . Marital status: Single    Spouse name: Not on file  . Number of children: 0  . Years of education: Not on file  . Highest education level: Not on file  Occupational History  . Occupation: Charity fundraiser  Tobacco Use  . Smoking status: Never Smoker  . Smokeless tobacco: Never Used  Vaping Use  . Vaping Use: Never used  Substance and Sexual Activity  . Alcohol use: Not Currently    Comment: occasional drinker  . Drug use: Yes    Types: Marijuana    Comment: 02/08/2020  . Sexual activity: Yes  Other Topics Concern  . Not on file  Social History Narrative  . Not on file   Social Determinants of Health   Financial Resource Strain: Not on file  Food Insecurity: Not on file  Transportation Needs: Not on file  Physical Activity: Not on file  Stress: Not on file  Social Connections: Not on file  Intimate Partner Violence: Not on file    ROS Per hpi  Otherwise negative.  Objective   Vitals as reported by the patient: There were no vitals filed for this visit.  [name] was seen today for vomiting.  Diagnoses and all orders for this visit:  Cyclic vomiting syndrome -     promethazine (PHENERGAN) 12.5 MG tablet; Take 1 tablet (12.5 mg total) by mouth every 8 (eight) hours as needed for nausea or vomiting. -     QUEtiapine (SEROQUEL) 100 MG tablet; TAKE 1 TABLET BY MOUTH AT BEDTIME -     ALPRAZolam (NIRAVAM) 0.5 MG dissolvable tablet; Take 1 tablet (0.5 mg total) by mouth 2 (two) times daily as needed for anxiety.   PLAN  Restart seroquel  Phenergan for nvd  Alprazolam sublingual for anxiety  Reviewed r/b/se of medications  Reviewed reasons to return to ED  Will send patient contact info for [facility] to contact and make appt  Patient encouraged to call clinic with any questions, comments, or concerns.   I discussed the assessment and treatment plan with  the patient. The patient was provided an opportunity to ask questions and all were answered. The patient agreed with the plan and demonstrated an understanding of the instructions.   The patient was advised to call back or seek an in-person evaluation if the symptoms worsen or if the condition fails to improve as anticipated.  I provided 15 minutes of non-face-to-face time during this encounter.  Janeece Agee, NP  Primary Care at [facility]

## 2020-07-18 ENCOUNTER — Other Ambulatory Visit (HOSPITAL_COMMUNITY): Payer: Self-pay

## 2020-07-18 ENCOUNTER — Other Ambulatory Visit: Payer: Self-pay | Admitting: Registered Nurse

## 2020-07-18 DIAGNOSIS — R112 Nausea with vomiting, unspecified: Secondary | ICD-10-CM

## 2020-07-20 ENCOUNTER — Other Ambulatory Visit: Payer: Self-pay

## 2020-07-20 ENCOUNTER — Inpatient Hospital Stay (HOSPITAL_COMMUNITY)
Admission: EM | Admit: 2020-07-20 | Discharge: 2020-07-24 | DRG: 392 | Disposition: A | Discharge status: Home / Self Care | Payer: Self-pay | Attending: Internal Medicine | Admitting: Internal Medicine

## 2020-07-20 ENCOUNTER — Encounter (HOSPITAL_COMMUNITY): Payer: Self-pay | Admitting: Emergency Medicine

## 2020-07-20 DIAGNOSIS — K209 Esophagitis, unspecified without bleeding: Secondary | ICD-10-CM

## 2020-07-20 DIAGNOSIS — Z83438 Family history of other disorder of lipoprotein metabolism and other lipidemia: Secondary | ICD-10-CM

## 2020-07-20 DIAGNOSIS — E876 Hypokalemia: Secondary | ICD-10-CM

## 2020-07-20 DIAGNOSIS — R1115 Cyclical vomiting syndrome unrelated to migraine: Secondary | ICD-10-CM | POA: Diagnosis present

## 2020-07-20 DIAGNOSIS — Z818 Family history of other mental and behavioral disorders: Secondary | ICD-10-CM

## 2020-07-20 DIAGNOSIS — Z20822 Contact with and (suspected) exposure to covid-19: Secondary | ICD-10-CM | POA: Diagnosis present

## 2020-07-20 DIAGNOSIS — K2100 Gastro-esophageal reflux disease with esophagitis, without bleeding: Secondary | ICD-10-CM | POA: Diagnosis present

## 2020-07-20 DIAGNOSIS — F1290 Cannabis use, unspecified, uncomplicated: Secondary | ICD-10-CM | POA: Diagnosis present

## 2020-07-20 DIAGNOSIS — F32A Depression, unspecified: Secondary | ICD-10-CM | POA: Diagnosis present

## 2020-07-20 DIAGNOSIS — Z833 Family history of diabetes mellitus: Secondary | ICD-10-CM

## 2020-07-20 DIAGNOSIS — Z8249 Family history of ischemic heart disease and other diseases of the circulatory system: Secondary | ICD-10-CM

## 2020-07-20 DIAGNOSIS — F411 Generalized anxiety disorder: Secondary | ICD-10-CM | POA: Diagnosis present

## 2020-07-20 DIAGNOSIS — K589 Irritable bowel syndrome without diarrhea: Secondary | ICD-10-CM | POA: Diagnosis present

## 2020-07-20 DIAGNOSIS — R112 Nausea with vomiting, unspecified: Principal | ICD-10-CM | POA: Diagnosis present

## 2020-07-20 DIAGNOSIS — R1110 Vomiting, unspecified: Secondary | ICD-10-CM

## 2020-07-20 DIAGNOSIS — K2900 Acute gastritis without bleeding: Secondary | ICD-10-CM

## 2020-07-20 LAB — URINALYSIS, ROUTINE W REFLEX MICROSCOPIC
Bilirubin Urine: NEGATIVE
Glucose, UA: NEGATIVE mg/dL
Hgb urine dipstick: NEGATIVE
Ketones, ur: 80 mg/dL — AB
Leukocytes,Ua: NEGATIVE
Nitrite: NEGATIVE
Protein, ur: NEGATIVE mg/dL
Specific Gravity, Urine: 1.025 (ref 1.005–1.030)
pH: 6 (ref 5.0–8.0)

## 2020-07-20 LAB — I-STAT BETA HCG BLOOD, ED (MC, WL, AP ONLY): I-stat hCG, quantitative: <5 m[IU]/mL (ref <5)

## 2020-07-20 LAB — COMPREHENSIVE METABOLIC PANEL
ALT: 24 U/L (ref 0–44)
AST: 14 U/L — ABNORMAL LOW (ref 15–41)
Albumin: 4.6 g/dL (ref 3.5–5.0)
Alkaline Phosphatase: 53 U/L (ref 38–126)
Anion gap: 9 (ref 5–15)
BUN: 13 mg/dL (ref 6–20)
CO2: 29 mmol/L (ref 22–32)
Calcium: 9.2 mg/dL (ref 8.9–10.3)
Chloride: 97 mmol/L — ABNORMAL LOW (ref 98–111)
Creatinine, Ser: 0.97 mg/dL (ref 0.44–1.00)
GFR, Estimated: >60 mL/min (ref >60)
Glucose, Bld: 99 mg/dL (ref 70–99)
Potassium: 2.7 mmol/L — CL (ref 3.5–5.1)
Sodium: 135 mmol/L (ref 135–145)
Total Bilirubin: 0.8 mg/dL (ref 0.3–1.2)
Total Protein: 8 g/dL (ref 6.5–8.1)

## 2020-07-20 LAB — CBC
HCT: 42.7 % (ref 36.0–46.0)
Hemoglobin: 14.1 g/dL (ref 12.0–15.0)
MCH: 28.4 pg (ref 26.0–34.0)
MCHC: 33 g/dL (ref 30.0–36.0)
MCV: 85.9 fL (ref 80.0–100.0)
Platelets: 300 10*3/uL (ref 150–400)
RBC: 4.97 MIL/uL (ref 3.87–5.11)
RDW: 12.7 % (ref 11.5–15.5)
WBC: 16.1 10*3/uL — ABNORMAL HIGH (ref 4.0–10.5)
nRBC: 0 % (ref 0.0–0.2)

## 2020-07-20 LAB — MAGNESIUM: Magnesium: 2.2 mg/dL (ref 1.7–2.4)

## 2020-07-20 LAB — LIPASE, BLOOD: Lipase: 37 U/L (ref 11–51)

## 2020-07-20 MED ORDER — POTASSIUM CHLORIDE 10 MEQ/100ML IV SOLN
10.0000 meq | INTRAVENOUS | Status: AC
  Administered 2020-07-20 (×3): 10 meq via INTRAVENOUS
  Filled 2020-07-20 (×3): qty 100

## 2020-07-20 MED ORDER — PROCHLORPERAZINE EDISYLATE 10 MG/2ML IJ SOLN
10.0000 mg | Freq: Once | INTRAMUSCULAR | Status: AC
  Administered 2020-07-20: 10 mg via INTRAVENOUS
  Filled 2020-07-20: qty 2

## 2020-07-20 MED ORDER — SODIUM CHLORIDE 0.9 % IV BOLUS
1000.0000 mL | Freq: Once | INTRAVENOUS | Status: AC
  Administered 2020-07-20: 1000 mL via INTRAVENOUS

## 2020-07-20 MED ORDER — LORAZEPAM 2 MG/ML IJ SOLN
2.0000 mg | Freq: Once | INTRAMUSCULAR | Status: AC
  Administered 2020-07-20: 2 mg via INTRAVENOUS
  Filled 2020-07-20: qty 1

## 2020-07-20 MED ORDER — SODIUM CHLORIDE 0.9 % IV SOLN: Freq: Once | INTRAVENOUS | Status: AC

## 2020-07-20 MED ORDER — HALOPERIDOL LACTATE 5 MG/ML IJ SOLN
2.0000 mg | Freq: Once | INTRAMUSCULAR | Status: AC
  Administered 2020-07-20: 2 mg via INTRAVENOUS
  Filled 2020-07-20: qty 1

## 2020-07-20 NOTE — ED Notes (Signed)
Pt is resting and mother is at the bedside. Pt is feeling better right now but did vomit x1 since receiving Haldol. Discarded emesis bag and gave pt a new one.

## 2020-07-20 NOTE — ED Notes (Signed)
Pt is sleeping (has been since she had her IV ativan), will obtain COVID swab once she awakens as she was very emotional earlier due to exhaustion and nausea.

## 2020-07-20 NOTE — ED Triage Notes (Signed)
Patient reports vomiting x1 week. She reports being seen at ED twice and once by PCP, both discharged home with nausea medication. Reports lasting using marijuana on Tuesday. States she took 50 mg phenergan and 25 mg pepcid at 1600 today w/ no relief. Patient has a hx of hyperemesis syndrome, GERD and gastritis.

## 2020-07-20 NOTE — ED Provider Notes (Signed)
[facility]-EMERGENCY DEPT Provider Note   CSN: [id number] Arrival date & time: 07/20/20  1839     History Chief Complaint  Patient presents with  . Abdominal Pain    Diana Thomas is a 25 y.o. female.  Diana Thomas has a history of cannabinoid hyperemesis syndrome versus other hyperemesis syndrome such as gastroparesis.  She has not been formally diagnosed and has not seen GI.  Apparently, her primary doctor has put in a referral, but she has not heard from them.  She has been to the ED 3 times within approximately 1 week with similar symptoms.  She states that she is also experiencing increased depression because she cannot tolerate her Lexapro.  She states that she has recently used marijuana and does want to stop.  The history is provided by the patient.  Emesis Severity:  Moderate Timing:  Intermittent Quality:  Stomach contents Progression:  Worsening Chronicity:  Recurrent Relieved by:  Nothing Worsened by:  Nothing Ineffective treatments:  Antiemetics Associated symptoms: abdominal pain (upper abdominal pain, mild)   Associated symptoms: no arthralgias, no chills, no cough, no diarrhea, no fever and no sore throat        Past Medical History:  Diagnosis Date  . Allergy   . Anemia   . Anxiety   . Cannabinoid hyperemesis syndrome   . Depression   . Gastritis   . GERD (gastroesophageal reflux disease)   . Heart murmur     Patient Active Problem List   Diagnosis Date Noted  . Cannabinoid hyperemesis syndrome 12/12/2019  . Moderate episode of recurrent major depressive disorder (HCC) 04/29/2018  . Gastroesophageal reflux disease without esophagitis 04/29/2018  . Generalized anxiety disorder 09/16/2017    Past Surgical History:  Procedure Laterality Date  . NO PAST SURGERIES       OB History    Gravida  1   Para      Term      Preterm      AB      Living        SAB      IAB      Ectopic      Multiple      Live  Births              Family History  Problem Relation Age of Onset  . Heart disease Mother   . Hypertension Mother   . Hyperlipidemia Mother   . ADD / ADHD Father   . Healthy Father   . Healthy Sister   . Liver disease Maternal Grandmother   . Diabetes Maternal Uncle   . Colon cancer Neg Hx   . Esophageal cancer Neg Hx   . Stomach cancer Neg Hx   . Rectal cancer Neg Hx     Social History   Tobacco Use  . Smoking status: Never Smoker  . Smokeless tobacco: Never Used  Vaping Use  . Vaping Use: Never used  Substance Use Topics  . Alcohol use: Not Currently    Comment: occasional drinker  . Drug use: Yes    Types: Marijuana    Comment: 02/08/2020    Home Medications Prior to Admission medications   Medication Sig Start Date End Date Taking? Authorizing Provider  ALPRAZolam (NIRAVAM) 0.5 MG dissolvable tablet Take 1 tablet (0.5 mg total) by mouth 2 (two) times daily as needed for anxiety. 07/17/20   Janeece Agee, NP  busPIRone (BUSPAR) 15 MG tablet TAKE 1 TABLET BY  MOUTH AT BEDTIME 08/28/19 08/29/20  Lezlie Lye, Meda Coffee, MD  capsaicin (ZOSTRIX) 0.025 % cream Apply topically 2 (two) times daily. 07/14/20   Lorre Nick, MD  escitalopram (LEXAPRO) 20 MG tablet TAKE 1 TABLET BY MOUTH AT BEDTIME 08/28/19 08/27/20  Lezlie Lye, Meda Coffee, MD  escitalopram (LEXAPRO) 20 MG tablet Take 1 tablet (20 mg total) by mouth at bedtime. 08/28/19     famotidine (PEPCID) 10 MG tablet Take 1 tablet (10 mg total) by mouth 2 (two) times daily. 12/03/19 01/02/20  Calvert Cantor, CNM  LORazepam (ATIVAN) 1 MG tablet Take 1 tablet (1 mg total) by mouth every 6 (six) hours as needed for anxiety. 07/15/20   McDonald, Mia A, PA-C  medroxyPROGESTERone (DEPO-PROVERA) 150 MG/ML injection Inject 150 mg into the muscle every 3 (three) months.    [provider]  ondansetron (ZOFRAN ODT) 8 MG disintegrating tablet Take 1 tablet (8 mg total) by mouth every 8 (eight) hours as needed for nausea or  vomiting. 02/09/20   Curatolo, Adam, DO  potassium chloride SA (KLOR-CON) 20 MEQ tablet Take 1 tablet (20 mEq total) by mouth 2 (two) times daily for 5 days. Patient not taking: Reported on 07/17/2020 07/15/20 07/20/20  McDonald, Pedro Earls A, PA-C  promethazine (PHENERGAN) 12.5 MG tablet Take 1 tablet (12.5 mg total) by mouth every 8 (eight) hours as needed for nausea or vomiting. 07/17/20   Janeece Agee, NP  QUEtiapine (SEROQUEL) 100 MG tablet TAKE 1 TABLET BY MOUTH AT BEDTIME 07/17/20 07/17/21  Janeece Agee, NP  dicyclomine (BENTYL) 20 MG tablet Take 1 tablet (20 mg total) by mouth every 8 (eight) hours as needed for spasms (Abdominal cramping). Patient not taking: Reported on 04/28/2020 05/31/19 07/15/20  Ward, Layla Maw, DO  esomeprazole (NEXIUM) 40 MG capsule Take 40 mg by mouth daily at 12 noon. OTC Patient not taking: Reported on 04/28/2020  07/15/20  [provider]  pantoprazole (PROTONIX) 40 MG tablet Take 1-2 tablets (40-80 mg total) by mouth daily. Patient not taking: No sig reported 10/12/18 02/09/20  Lezlie Lye, Meda Coffee, MD  prochlorperazine (COMPAZINE) 10 MG/2ML injection Inject 2 mLs (10 mg total) into the vein every 6 (six) hours as needed. Patient not taking: No sig reported 12/17/19 02/09/20  Constant, Peggy, MD    Allergies    Patient has no known allergies.  Review of Systems   Review of Systems  Constitutional: Negative for chills and fever.  HENT: Negative for ear pain and sore throat.   Eyes: Negative for pain and visual disturbance.  Respiratory: Negative for cough and shortness of breath.   Cardiovascular: Negative for chest pain and palpitations.  Gastrointestinal: Positive for abdominal pain (upper abdominal pain, mild) and vomiting. Negative for diarrhea.  Genitourinary: Negative for dysuria and hematuria.  Musculoskeletal: Negative for arthralgias and back pain.  Skin: Negative for color change and rash.  Neurological: Negative for seizures and syncope.  All other  systems reviewed and are negative.   Physical Exam Updated Vital Signs BP (!) 130/92 (BP Location: Left Arm)   Pulse 91   Temp 98.1 F (36.7 C) (Oral)   Resp 18   SpO2 99%   Physical Exam Vitals and nursing note reviewed.  HENT:     Head: Normocephalic and atraumatic.     Mouth/Throat:     Comments: Lips are dry Eyes:     General: No scleral icterus. Pulmonary:     Effort: Pulmonary effort is normal. No respiratory distress.  Abdominal:  Tenderness: There is abdominal tenderness in the epigastric area. There is no guarding or rebound.  Musculoskeletal:     Cervical back: Normal range of motion.  Skin:    General: Skin is warm and dry.     Capillary Refill: Capillary refill takes less than 2 seconds.  Neurological:     General: No focal deficit present.     Mental Status: She is alert.  Psychiatric:     Comments: tearful     ED Results / Procedures / Treatments   Labs (all labs ordered are listed, but only abnormal results are displayed) Labs Reviewed  COMPREHENSIVE METABOLIC PANEL - Abnormal; Notable for the following components:      Result Value   Potassium 2.7 (*)    Chloride 97 (*)    AST 14 (*)    All other components within normal limits  CBC - Abnormal; Notable for the following components:   WBC 16.1 (*)    All other components within normal limits  URINALYSIS, ROUTINE W REFLEX MICROSCOPIC - Abnormal; Notable for the following components:   APPearance HAZY (*)    Ketones, ur 80 (*)    All other components within normal limits  SARS CORONAVIRUS 2 (TAT 6-24 HRS)  LIPASE, BLOOD  MAGNESIUM  I-STAT BETA HCG BLOOD, ED ([facility], [facility], [facility] ONLY)    EKG None  Radiology No results found.  Procedures Procedures   Medications Ordered in ED Medications  potassium chloride 10 mEq in 100 mL IVPB (10 mEq Intravenous New Bag/Given 07/20/20 2232)  sodium chloride 0.9 % bolus 1,000 mL (0 mLs Intravenous Stopped 07/20/20 2103)  haloperidol lactate (HALDOL)  injection 2 mg (2 mg Intravenous Given 07/20/20 2009)  prochlorperazine (COMPAZINE) injection 10 mg (10 mg Intravenous Given 07/20/20 2137)  0.9 %  sodium chloride infusion ( Intravenous New Bag/Given 07/20/20 2136)  LORazepam (ATIVAN) injection 2 mg (2 mg Intravenous Given 07/20/20 2210)    ED Course  I have reviewed the triage vital signs and the nursing notes.  Pertinent labs & imaging results that were available during my care of the patient were reviewed by me and considered in my medical decision making (see chart for details).  Clinical Course as of 07/20/20 2330  Wynelle Link Jul 20, 2020  2327 I spoke with Dr. Cyndia Bent regarding admission. [AW]    Clinical Course User Index [AW] Koleen Distance, MD   MDM Rules/Calculators/A&P                          Diana Thomas presents with nausea, vomiting.  She has a history of cannabinoid hyperemesis syndrome.  She has had multiple recent ED visits and a virtual visit for the same.  She was given multiple rounds of antiemetics and was still unable to tolerate oral fluids.  I did not image her abdomen as her abdominal exam was benign, and her symptoms were consistent with her prior episodes.  I did give her potassium runs for her hypokalemia.  She will be admitted for symptomatic management. Final Clinical Impression(s) / ED Diagnoses Final diagnoses:  Intractable vomiting with nausea, unspecified vomiting type  Hypokalemia    Rx / DC Orders ED Discharge Orders    None       Koleen Distance, MD 07/20/20 2330

## 2020-07-21 DIAGNOSIS — R1115 Cyclical vomiting syndrome unrelated to migraine: Secondary | ICD-10-CM | POA: Diagnosis present

## 2020-07-21 DIAGNOSIS — E876 Hypokalemia: Secondary | ICD-10-CM

## 2020-07-21 DIAGNOSIS — R1110 Vomiting, unspecified: Secondary | ICD-10-CM

## 2020-07-21 LAB — RAPID URINE DRUG SCREEN, HOSP PERFORMED
Amphetamines: NOT DETECTED
Barbiturates: NOT DETECTED
Benzodiazepines: POSITIVE — AB
Cocaine: NOT DETECTED
Opiates: NOT DETECTED
Tetrahydrocannabinol: POSITIVE — AB

## 2020-07-21 LAB — HIV ANTIBODY (ROUTINE TESTING W REFLEX): HIV Screen 4th Generation wRfx: NONREACTIVE

## 2020-07-21 LAB — BASIC METABOLIC PANEL
Anion gap: 9 (ref 5–15)
BUN: 9 mg/dL (ref 6–20)
CO2: 23 mmol/L (ref 22–32)
Calcium: 8.4 mg/dL — ABNORMAL LOW (ref 8.9–10.3)
Chloride: 103 mmol/L (ref 98–111)
Creatinine, Ser: 0.72 mg/dL (ref 0.44–1.00)
GFR, Estimated: >60 mL/min (ref >60)
Glucose, Bld: 96 mg/dL (ref 70–99)
Potassium: 3.2 mmol/L — ABNORMAL LOW (ref 3.5–5.1)
Sodium: 135 mmol/L (ref 135–145)

## 2020-07-21 LAB — SARS CORONAVIRUS 2 (TAT 6-24 HRS): SARS Coronavirus 2: NEGATIVE

## 2020-07-21 MED ORDER — KCL-LACTATED RINGERS-D5W 20 MEQ/L IV SOLN
INTRAVENOUS | Status: DC
  Filled 2020-07-21 (×2): qty 1000

## 2020-07-21 MED ORDER — PROMETHAZINE HCL 25 MG RE SUPP
25.0000 mg | Freq: Four times a day (QID) | RECTAL | Status: DC | PRN
  Filled 2020-07-21 (×2): qty 1

## 2020-07-21 MED ORDER — ONDANSETRON HCL 4 MG/2ML IJ SOLN
4.0000 mg | Freq: Four times a day (QID) | INTRAMUSCULAR | Status: DC
  Administered 2020-07-21 – 2020-07-22 (×4): 4 mg via INTRAVENOUS
  Filled 2020-07-21 (×4): qty 2

## 2020-07-21 MED ORDER — LORAZEPAM 2 MG/ML IJ SOLN
0.5000 mg | Freq: Once | INTRAMUSCULAR | Status: AC | PRN
  Administered 2020-07-21: 0.5 mg via INTRAVENOUS
  Filled 2020-07-21: qty 1

## 2020-07-21 MED ORDER — ENOXAPARIN SODIUM 40 MG/0.4ML IJ SOSY
40.0000 mg | PREFILLED_SYRINGE | INTRAMUSCULAR | Status: DC
  Administered 2020-07-21 – 2020-07-24 (×4): 40 mg via SUBCUTANEOUS
  Filled 2020-07-21 (×4): qty 0.4

## 2020-07-21 MED ORDER — SODIUM CHLORIDE 0.9 % IV SOLN
12.5000 mg | Freq: Four times a day (QID) | INTRAVENOUS | Status: DC | PRN
  Administered 2020-07-21: 12.5 mg via INTRAVENOUS
  Filled 2020-07-21: qty 12.5
  Filled 2020-07-21: qty 0.5

## 2020-07-21 MED ORDER — BOOST / RESOURCE BREEZE PO LIQD CUSTOM
1.0000 | Freq: Three times a day (TID) | ORAL | Status: DC
  Administered 2020-07-22: 1 via ORAL

## 2020-07-21 MED ORDER — SODIUM CHLORIDE 0.9 % IV SOLN
12.5000 mg | Freq: Four times a day (QID) | INTRAVENOUS | Status: DC | PRN
  Administered 2020-07-21 (×2): 12.5 mg via INTRAVENOUS
  Filled 2020-07-21 (×2): qty 12.5
  Filled 2020-07-21: qty 0.5

## 2020-07-21 MED ORDER — PANTOPRAZOLE SODIUM 40 MG IV SOLR
40.0000 mg | INTRAVENOUS | Status: DC
  Administered 2020-07-21 – 2020-07-24 (×4): 40 mg via INTRAVENOUS
  Filled 2020-07-21 (×4): qty 40

## 2020-07-21 MED ORDER — PROCHLORPERAZINE EDISYLATE 10 MG/2ML IJ SOLN
10.0000 mg | Freq: Four times a day (QID) | INTRAMUSCULAR | Status: DC | PRN
  Administered 2020-07-21: 10 mg via INTRAVENOUS
  Filled 2020-07-21: qty 2

## 2020-07-21 MED ORDER — POTASSIUM CHLORIDE 2 MEQ/ML IV SOLN: INTRAVENOUS | Status: DC

## 2020-07-21 MED ORDER — ESCITALOPRAM OXALATE 20 MG PO TABS
20.0000 mg | ORAL_TABLET | Freq: Every day | ORAL | Status: DC
  Administered 2020-07-21 – 2020-07-22 (×3): 20 mg via ORAL
  Filled 2020-07-21 (×2): qty 1
  Filled 2020-07-21: qty 2

## 2020-07-21 MED ORDER — BUSPIRONE HCL 5 MG PO TABS
15.0000 mg | ORAL_TABLET | Freq: Every day | ORAL | Status: DC
  Administered 2020-07-21 – 2020-07-22 (×3): 15 mg via ORAL
  Filled 2020-07-21: qty 1
  Filled 2020-07-21: qty 2
  Filled 2020-07-21: qty 1

## 2020-07-21 MED ORDER — QUETIAPINE FUMARATE 50 MG PO TABS
100.0000 mg | ORAL_TABLET | Freq: Every day | ORAL | Status: DC
  Administered 2020-07-21 – 2020-07-22 (×3): 100 mg via ORAL
  Filled 2020-07-21: qty 2
  Filled 2020-07-21: qty 1
  Filled 2020-07-21: qty 2

## 2020-07-21 MED ORDER — LORAZEPAM 2 MG/ML IJ SOLN
0.5000 mg | INTRAMUSCULAR | Status: DC | PRN
  Administered 2020-07-21 – 2020-07-24 (×12): 0.5 mg via INTRAVENOUS
  Filled 2020-07-21 (×12): qty 1

## 2020-07-21 MED ORDER — PROMETHAZINE HCL 25 MG PO TABS
25.0000 mg | ORAL_TABLET | Freq: Four times a day (QID) | ORAL | Status: DC | PRN
  Filled 2020-07-21: qty 1

## 2020-07-21 MED ORDER — HALOPERIDOL LACTATE 5 MG/ML IJ SOLN
1.0000 mg | Freq: Once | INTRAMUSCULAR | Status: AC | PRN
  Administered 2020-07-21: 1 mg via INTRAVENOUS
  Filled 2020-07-21: qty 1

## 2020-07-21 NOTE — Progress Notes (Signed)
PROGRESS NOTE    Diana Thomas  ZOX:[id number] DOB: [birth year] DOA: 07/20/2020 PCP: Diana Agee, NP  Chief Complaint  Patient presents with  . Abdominal Pain    Brief Narrative:  Diana Thomas is Diana Thomas 25 y.o. female with medical history significant for cyclic vomiting, cannabis use, anxiety and GERD who presents with persistent nausea and vomiting.  About Diana Thomas week ago she began to have acute intractable nausea and vomiting throughout the day.  Has epigastric abdominal pain worse with vomiting.  Denies any diarrhea.  No fever.  No prior abdominal surgical history.  Patient endorsed daily cannabis use.  States she has been having cyclic vomiting since about 8119 which is when she started to use cannabois. states she as Diana Thomas habit and not for any medical reasons. No other drug or alcohol use.   She has been evaluated in the ED for about 2 times in the past month for similar presentation. Had improvement with short course of PO Haldol. She had abdominal ultrasound on 5/30 that was negative.  Has GI referral planned outpatient.  Assessment & Plan:   Principal Problem:   Cyclical vomiting Active Problems:   Generalized anxiety disorder   Hypokalemia   Intractable vomiting  Cyclic vomiting with intractable nasusea/vomiting - suspect this is 2/2 MJ use (notes desire for hot showers with discomfort) - scheduled zofran, phenergan - can use ativan and/or haldol sparingly for nausea/vomiting unresponsive to zofran/phenergan (discussed haldol adverse reactions, risk - qtc appropriate) - strongly enourage cessation of cannabis - she notes willingness to do this - appreciate GI recs - amitriptyline, PPI - encouraged stopping MJ  Hypokalemia -MIVF with KCL  Anxiety/depression - Lexapro and Seroquel - buspar d/c'd, GI planning on starting amitriptyline (probably need to cross taper with lexapro)  DVT prophylaxis: lovenox Code Status: full  Family Communication: none at  bedside Disposition:   Status is: Observation  The patient remains OBS appropriate and will d/c before 2 midnights.  Dispo: The patient is from: Home              Anticipated d/c is to: Home              Patient currently is not medically stable to d/c.   Difficult to place patient No  Consultants:   GI  Procedures:  None   Antimicrobials: Anti-infectives (From admission, onward)   None         Subjective: Feels very poorly Asking for ativan/haldol  Objective: Vitals:   07/21/20 0237 07/21/20 0238 07/21/20 0646 07/21/20 0900  BP: (!) 156/110 (!) 160/92 (!) 152/98 (!) 144/84  Pulse: 81  97 82  Resp: 16  19 16   Temp: 99 F (37.2 C)  98.9 F (37.2 C) 98.5 F (36.9 C)  TempSrc: Oral  Oral Oral  SpO2: 98%  100% 100%  Weight: 81.2 kg     Height: 5\' 5"  (1.651 m)       Intake/Output Summary (Last 24 hours) at 07/21/2020 1810 Last data filed at 07/21/2020 1632 Gross per 24 hour  Intake 2244.79 ml  Output 800 ml  Net 1444.79 ml   Filed Weights   07/21/20 0237  Weight: 81.2 kg    Examination:  General exam: Appears calm and comfortable  Respiratory system: Clear to auscultation. Respiratory effort normal. Cardiovascular system: S1 & S2 heard, RRR.  Gastrointestinal system: Abdomen is nondistended, soft and nontender Central nervous system: Alert and oriented. No focal neurological deficits. Extremities: no LEE Skin: No rashes, lesions  or ulcers Psychiatry: Judgement and insight appear normal. Mood & affect appropriate.     Data Reviewed: I have personally reviewed following labs and imaging studies  CBC: Recent Labs  Lab 07/15/20 0144 07/20/20 2007  WBC 14.6* 16.1*  HGB 12.7 14.1  HCT 39.2 42.7  MCV 86.9 85.9  PLT 288 300    Basic Metabolic Panel: Recent Labs  Lab 07/15/20 0144 07/20/20 2007 07/20/20 2139 07/21/20 0322  NA 141 135  --  135  K 3.0* 2.7*  --  3.2*  CL 105 97*  --  103  CO2 25 29  --  23  GLUCOSE 141* 99  --  96  BUN 13  13  --  9  CREATININE 0.81 0.97  --  0.72  CALCIUM 9.2 9.2  --  8.4*  MG 2.0  --  2.2  --     GFR: Estimated Creatinine Clearance: 111.2 mL/min (by C-G formula based on SCr of 0.72 mg/dL).  Liver Function Tests: Recent Labs  Lab 07/15/20 0144 07/20/20 2007  AST 26 14*  ALT 21 24  ALKPHOS 55 53  BILITOT 0.6 0.8  PROT 8.2* 8.0  ALBUMIN 4.5 4.6    CBG: No results for input(s): GLUCAP in the last 168 hours.   Recent Results (from the past 240 hour(s))  SARS CORONAVIRUS 2 (TAT 6-24 HRS) Nasopharyngeal Nasopharyngeal Swab     Status: None   Collection Time: 07/21/20 12:40 AM   Specimen: Nasopharyngeal Swab  Result Value Ref Range Status   SARS Coronavirus 2 NEGATIVE NEGATIVE Final    Comment: (NOTE) SARS-CoV-2 target nucleic acids are NOT DETECTED.  The SARS-CoV-2 RNA is generally detectable in upper and lower respiratory specimens during the acute phase of infection. Negative results do not preclude SARS-CoV-2 infection, do not rule out co-infections with other pathogens, and should not be used as the sole basis for treatment or other patient management decisions. Negative results must be combined with clinical observations, patient history, and epidemiological information. The expected result is Negative.  Fact Sheet for Patients: HairSlick.no  Fact Sheet for Healthcare Providers: quierodirigir.com  This test is not yet approved or cleared by the [place] FDA and  has been authorized for detection and/or diagnosis of SARS-CoV-2 by FDA under an Emergency Use Authorization (EUA). This EUA will remain  in effect (meaning this test can be used) for the duration of the COVID-19 declaration under Se ction 564(b)(1) of the Act, 21 U.S.C. section 360bbb-3(b)(1), unless the authorization is terminated or revoked sooner.  Performed at [facility], [address] [place], [place] [address]           Radiology Studies: No results found.      Scheduled Meds: . busPIRone  15 mg Oral QHS  . enoxaparin (LOVENOX) injection  40 mg Subcutaneous Q24H  . escitalopram  20 mg Oral QHS  . feeding supplement  1 Container Oral TID BM  . ondansetron (ZOFRAN) IV  4 mg Intravenous Q6H  . pantoprazole (PROTONIX) IV  40 mg Intravenous Q24H  . QUEtiapine  100 mg Oral QHS   Continuous Infusions: . promethazine (PHENERGAN) injection (IM or IVPB) Stopped (07/21/20 1630)     LOS: 0 days    Time spent: over 30 min    Lacretia Nicks, MD [facility]   To contact the attending provider between 7A-7P or the covering provider during after hours 7P-7A, please log into the web site [email or web address] and access using universal   password for that web site. If you do not have the password, please call the hospital operator.  07/21/2020, 6:10 PM

## 2020-07-21 NOTE — ED Notes (Signed)
Pt continues to sleep, will continue to monitor.  Pt was given ice chips earlier and set some ice water at the bedside table for pt.

## 2020-07-21 NOTE — Plan of Care (Signed)

## 2020-07-21 NOTE — Consult Note (Signed)
Referring Provider:  [facility]         Primary Care Physician:  Janeece Agee, NP Primary Gastroenterologist: Ileene Patrick, MD             We were asked to see this patient for:  Nausea / vomiting           ASSESSMENT / PLAN:   # 25 yo female with acute on chronic nausea / vomiting. Prior evaluation has included RUQ x 2, CT scan , EGD. She smokes THC four times daily and this is is likely contributing or the sole cause of her chronic nausea. However, she has had nearly intractable vomiting over the last several days so other etiologies not excluded.  --She understands that [facility] is very likely cause of chronic nausea. She wants to stop using it.   --She doesn't feel like Buspar helps GI symptoms but I am reluctant to stopped it right now out of concern that it may cause some rebound anxiety.  --If discontinuation of THC doesn't help then we can consider Elavil.  --In interim for acute symptoms agree with scheduled Zofran using phenergan  / Ativan only for refractory symptoms ( ativan helped in ED)   # GERD, manages at home with Pepcid. Currently not on anything for GERD in hospital. With vomiting she is at risk for esophagitis.  --Will give IV protonix Q 24 hours.   # ? IBS. Bowel habits are mildly irregular and she has abdominal pain relieved with defecation.   --She has never tried fiber on a consistent basis. Upon discharge when acute issues have resolve she can try daily Benefiber.      HPI:                                                                                                                             Chief Complaint: nausea and vomiting  Diana Thomas is a 25 y.o. female with a past medical history significant for chronic nausea and vomiting, anxiety, GERD, chronic THC use.   Diana Thomas was seen in our office in January 2020 for evaluation of nausea with occasional vomiting, GERD symptom,  Epigastric pain , and constipation.  Prior CT scan raised concern  for ? Mild wall thickening of colon which Dr. Adela Lank felt was nonspecific. Patient wanted to hold off on a colonoscopy but did undergo EGD which was normal. She was started on Buspar and continues to take it but doesn't feel it really helps.   She has not been seen in the office since EGD in Jan 2020.    INTERVAL HISTORY   Since 2017 Diana Thomas has been having daily nausea. Zofran generally helps prevent vomiting.  About every three months the nausea does become associated with vomiting and she has to go to ED for IVF. The vomiting is not related to eating, she can wake up nauseated. She smokes THC four times a day. She feels like the nausea / vomiting  are worse when she is constipated. Her stools are soft / jagged. She may have no bowel movements for ~ 3 days then may have up to 4 in one day. She has chronic lower abdominal pain which starts just before having a BM. The pain is relieved with defecation. She hasn't seen any blood in her stool.  She has tried fiber but not on a consistent basis but she doesn't like to take medications and cannot already remember to take the fiber.   Several days ago Diana Thomas began vomiting and was seen in ED twice before being admitted yesterday with hypokalemia.  Several days ago Diana Thomas began having episodes of vomiting for which she was seen twice in ED before being admitted yesterday with hypokalemia. She does take Lexapro, Buspar and Seroquel. She also takes Pepcid for GERD and Zofran for nausea. Weight is stable   PREVIOUS ENDOSCOPIC EVALUATIONS / PERTINENT STUDIES   Aug 2019 RUQ [place] for abdominal pain and vomiting  November 2019 CT scan w/ contrast for abdominal pain  1. Mild diffuse circumferential wall thickening about the colon, favored to be related incomplete distension, although superimposed acute colitis would be difficult to exclude, and could be considered in the correct clinical setting. 2. No other acute intra-abdominal or pelvic process.  Normal appendix. 3. Moderate retained stool within the distal colon/rectal vault, suggesting constipation.  January 2020 EGD for epigastric pain, nausea / vomiting.  Normal exam. Gastric biopsies normal. No H.pylori.   April 2021 RUQ normal  Past Medical History:  Diagnosis Date  . Allergy   . Anemia   . Anxiety   . Cannabinoid hyperemesis syndrome   . Depression   . Gastritis   . GERD (gastroesophageal reflux disease)   . Heart murmur     Past Surgical History:  Procedure Laterality Date  . NO PAST SURGERIES      Prior to Admission medications   Medication Sig Start Date End Date Taking? Authorizing Provider  ALPRAZolam (NIRAVAM) 0.5 MG dissolvable tablet Take 1 tablet (0.5 mg total) by mouth 2 (two) times daily as needed for anxiety. 07/17/20  Yes Janeece Agee, NP  busPIRone (BUSPAR) 15 MG tablet TAKE 1 TABLET BY MOUTH AT BEDTIME 08/28/19 08/29/20 Yes Lezlie Lye, Irma M, MD  capsaicin (ZOSTRIX) 0.025 % cream Apply topically 2 (two) times daily. 07/14/20  Yes Lorre Nick, MD  escitalopram (LEXAPRO) 20 MG tablet Take 1 tablet (20 mg total) by mouth at bedtime. 08/28/19  Yes   famotidine (PEPCID) 10 MG tablet Take 1 tablet (10 mg total) by mouth 2 (two) times daily. 12/03/19 01/02/20 Yes Weinhold, Lelon Mast C, CNM  medroxyPROGESTERone (DEPO-PROVERA) 150 MG/ML injection Inject 150 mg into the muscle every 3 (three) months.   Yes [provider]  ondansetron (ZOFRAN ODT) 8 MG disintegrating tablet Take 1 tablet (8 mg total) by mouth every 8 (eight) hours as needed for nausea or vomiting. 02/09/20  Yes Curatolo, Adam, DO  promethazine (PHENERGAN) 12.5 MG tablet Take 1 tablet (12.5 mg total) by mouth every 8 (eight) hours as needed for nausea or vomiting. 07/17/20  Yes Janeece Agee, NP  QUEtiapine (SEROQUEL) 100 MG tablet TAKE 1 TABLET BY MOUTH AT BEDTIME 07/17/20 07/17/21 Yes Janeece Agee, NP  escitalopram (LEXAPRO) 20 MG tablet TAKE 1 TABLET BY MOUTH AT BEDTIME 08/28/19  08/27/20  Lezlie Lye, Meda Coffee, MD  LORazepam (ATIVAN) 1 MG tablet Take 1 tablet (1 mg total) by mouth every 6 (six) hours as needed for anxiety. Patient not taking:  No sig reported 07/15/20   McDonald, Mia A, PA-C  potassium chloride SA (KLOR-CON) 20 MEQ tablet Take 1 tablet (20 mEq total) by mouth 2 (two) times daily for 5 days. Patient not taking: No sig reported 07/15/20 07/20/20  McDonald, Mia A, PA-C  dicyclomine (BENTYL) 20 MG tablet Take 1 tablet (20 mg total) by mouth every 8 (eight) hours as needed for spasms (Abdominal cramping). Patient not taking: Reported on 04/28/2020 05/31/19 07/15/20  Ward, Layla MawKristen N, DO  esomeprazole (NEXIUM) 40 MG capsule Take 40 mg by mouth daily at 12 noon. OTC Patient not taking: Reported on 04/28/2020  07/15/20  [provider]  pantoprazole (PROTONIX) 40 MG tablet Take 1-2 tablets (40-80 mg total) by mouth daily. Patient not taking: No sig reported 10/12/18 02/09/20  Lezlie LyeSantiago Lago, Meda CoffeeIrma M, MD  prochlorperazine (COMPAZINE) 10 MG/2ML injection Inject 2 mLs (10 mg total) into the vein every 6 (six) hours as needed. Patient not taking: No sig reported 12/17/19 02/09/20  Constant, Peggy, MD    Current Facility-Administered Medications  Medication Dose Route Frequency Provider Last Rate Last Admin  . busPIRone (BUSPAR) tablet 15 mg  15 mg Oral QHS Tu, Ching T, DO   15 mg at 07/21/20 0053  . enoxaparin (LOVENOX) injection 40 mg  40 mg Subcutaneous Q24H Tu, Ching T, DO   40 mg at 07/21/20 1005  . escitalopram (LEXAPRO) tablet 20 mg  20 mg Oral QHS Tu, Ching T, DO   20 mg at 07/21/20 0053  . feeding supplement (BOOST / RESOURCE BREEZE) liquid 1 Container  1 Container Oral TID BM Tu, Ching T, DO      . ondansetron (ZOFRAN) injection 4 mg  4 mg Intravenous Q6H Zigmund DanielPowell, A Caldwell Jr., MD      . promethazine (PHENERGAN) tablet 25 mg  25 mg Oral Q6H PRN Zigmund DanielPowell, A Caldwell Jr., MD       Or  . promethazine (PHENERGAN) 12.5 mg in sodium chloride 0.9 % 50 mL IVPB  12.5 mg  Intravenous Q6H PRN Zigmund DanielPowell, A Caldwell Jr., MD       Or  . promethazine (PHENERGAN) suppository 25 mg  25 mg Rectal Q6H PRN Zigmund DanielPowell, A Caldwell Jr., MD      . QUEtiapine (SEROQUEL) tablet 100 mg  100 mg Oral QHS Tu, Ching T, DO   100 mg at 07/21/20 0053    Allergies as of 07/20/2020  . (No Known Allergies)    Family History  Problem Relation Age of Onset  . Heart disease Mother   . Hypertension Mother   . Hyperlipidemia Mother   . ADD / ADHD Father   . Healthy Father   . Healthy Sister   . Liver disease Maternal Grandmother   . Diabetes Maternal Uncle   . Colon cancer Neg Hx   . Esophageal cancer Neg Hx   . Stomach cancer Neg Hx   . Rectal cancer Neg Hx     Social History   Socioeconomic History  . Marital status: Single    Spouse name: Not on file  . Number of children: 0  . Years of education: Not on file  . Highest education level: Not on file  Occupational History  . Occupation: [employer] fundraiser[employer]  Tobacco Use  . Smoking status: Never Smoker  . Smokeless tobacco: Never Used  Vaping Use  . Vaping Use: Never used  Substance and Sexual Activity  . Alcohol use: Not Currently    Comment: occasional drinker  . Drug use:  Yes    Types: Marijuana    Comment: 02/08/2020  . Sexual activity: Yes  Other Topics Concern  . Not on file  Social History Narrative  . Not on file   Social Determinants of Health   Financial Resource Strain: Not on file  Food Insecurity: Not on file  Transportation Needs: Not on file  Physical Activity: Not on file  Stress: Not on file  Social Connections: Not on file  Intimate Partner Violence: Not on file    Review of Systems: All systems reviewed and negative except where noted in HPI.  OBJECTIVE:    Physical Exam: Vital signs in last 24 hours: Temp:  [98.1 F (36.7 C)-99 F (37.2 C)] 98.5 F (36.9 C) (06/06 0900) Pulse Rate:  [64-97] 82 (06/06 0900) Resp:  [16-19] 16 (06/06 0900) BP: (127-160)/(82-110) 144/84 (06/06 0900) SpO2:  [98  %-100 %] 100 % (06/06 0900) Weight:  [81.2 kg] 81.2 kg (06/06 0237)   General:   Alert  female in NAD Psych:  Pleasant, cooperative. Normal mood and affect. Eyes:  Pupils equal, sclera clear, no icterus.   Conjunctiva pink. Ears:  Normal auditory acuity. Nose:  No deformity, discharge,  or lesions. Neck:  Supple; no masses Lungs:  Clear throughout to auscultation.   No wheezes, crackles, or rhonchi.  Heart:  Regular rate and rhythm; no murmurs, no lower extremity edema Abdomen:  Soft, non-distended, nontender, BS active, no palp mass   Rectal:  Deferred  Msk:  Symmetrical without gross deformities. . Neurologic:  Alert and  oriented x4;  grossly normal neurologically. Skin:  Intact without significant lesions or rashes.  Filed Weights   07/21/20 0237  Weight: 81.2 kg     Scheduled inpatient medications . busPIRone  15 mg Oral QHS  . enoxaparin (LOVENOX) injection  40 mg Subcutaneous Q24H  . escitalopram  20 mg Oral QHS  . feeding supplement  1 Container Oral TID BM  . ondansetron (ZOFRAN) IV  4 mg Intravenous Q6H  . QUEtiapine  100 mg Oral QHS      Intake/Output from previous day: 06/05 0701 - 06/06 0700 In: 1604.7 [P.O.:90; I.V.:364.2; IV Piggyback:1150.5] Out: 400 [Urine:400] Intake/Output this shift: Total I/O In: 220.1 [P.O.:170; IV Piggyback:50.1] Out: 0    Lab Results: Recent Labs    07/20/20 2007  WBC 16.1*  HGB 14.1  HCT 42.7  PLT 300   BMET Recent Labs    07/20/20 2007 07/21/20 0322  NA 135 135  K 2.7* 3.2*  CL 97* 103  CO2 29 23  GLUCOSE 99 96  BUN 13 9  CREATININE 0.97 0.72  CALCIUM 9.2 8.4*   LFT Recent Labs    07/20/20 2007  PROT 8.0  ALBUMIN 4.6  AST 14*  ALT 24  ALKPHOS 53  BILITOT 0.8   PT/INR No results for input(s): LABPROT, INR in the last 72 hours. Hepatitis Panel No results for input(s): HEPBSAG, HCVAB, HEPAIGM, HEPBIGM in the last 72 hours.   . CBC Latest Ref Rng & Units 07/20/2020 07/15/2020 07/14/2020  WBC 4.0 -  10.5 K/uL 16.1(H) 14.6(H) 14.5(H)  Hemoglobin 12.0 - 15.0 g/dL 33.5 45.6 25.6  Hematocrit 36.0 - 46.0 % 42.7 39.2 41.7  Platelets 150 - 400 K/uL 300 288 318    . CMP Latest Ref Rng & Units 07/21/2020 07/20/2020 07/15/2020  Glucose 70 - 99 mg/dL 96 99 389(H)  BUN 6 - 20 mg/dL 9 13 13   Creatinine 0.44 - 1.00 mg/dL 7.34 2.87  Sodium 135 - 145 mmol/L 135 135 141  Potassium 3.5 - 5.1 mmol/L 3.2(L) 2.7(LL) 3.0(L)  Chloride 98 - 111 mmol/L 103 97(L) 105  CO2 22 - 32 mmol/L 23 29 25   Calcium 8.9 - 10.3 mg/dL ) 9.2 9.2  Total Protein 6.5 - 8.1 g/dL - 8.0 4.6(F)  Total Bilirubin 0.3 - 1.2 mg/dL - 0.8 0.6  Alkaline Phos 38 - 126 U/L - 53 55  AST 15 - 41 U/L - 14(L) 26  ALT 0 - 44 U/L - 24 21   Studies/Results: No results found.  Principal Problem:   Cyclical vomiting Active Problems:   Generalized anxiety disorder   Hypokalemia    6.8(L, NP-C @  07/21/2020, 11:12 AM

## 2020-07-21 NOTE — H&P (Signed)
History and Physical    Diana Thomas Diana Thomas DOB: May 17, 1995 DOA: 07/20/2020  PCP: Janeece Agee, NP  Patient coming from: Home  I have personally briefly reviewed patient's old medical records in [facility]  Chief Complaint: nausea/vomiting  HPI: Diana Thomas is a 25 y.o. female with medical history significant for cyclic vomiting, [place] use, anxiety and GERD who presents with persistent nausea and vomiting.  About a week ago she began to have acute intractable nausea and vomiting throughout the day.  Has epigastric abdominal pain worse with vomiting.  Denies any diarrhea.  No fever.  No prior abdominal surgical history.  Patient endorsed daily cannabis use.  States she has been having cyclic vomiting since about 0300 which is when she started to use cannabois. states she as a habit and not for any medical reasons. No other drug or alcohol use.   She has been evaluated in the ED for about 2 times in the past month for similar presentation. Had improvement with short course of PO Haldol. She had abdominal ultrasound on 5/30 that was negative.  Has GI referral planned outpatient.  ED Course:  She was afebrile normotensive on room air.  WBC of 16 which is chronic.  K of 2.7.  Magnesium of 2.2.  She was given Haldol, ativan with some improvement and hospitalist was called for admission.  Review of Systems: Constitutional: No Weight Change, No Fever ENT/Mouth: No sore throat, No Rhinorrhea Eyes: No Eye Pain, No Vision Changes Cardiovascular: No Chest Pain, no SO Respiratory: No Cough, No Sputum, No Wheezing, no Dyspnea  Gastrointestinal: + Nausea, + Vomiting, No Diarrhea, No Constipation, + Pain Genitourinary: no Urinary Incontinence, No Urgency, No Flank Pain Musculoskeletal: No Arthralgias, No Myalgias Skin: No Skin Lesions, No Pruritus, Neuro: no Weakness, No Numbness Psych: No Anxiety/Panic, No Depression, + decrease appetite Heme/Lymph: No Bruising, No  Bleeding  Past Medical History:  Diagnosis Date  . Allergy   . Anemia   . Anxiety   . Cannabinoid hyperemesis syndrome   . Depression   . Gastritis   . GERD (gastroesophageal reflux disease)   . Heart murmur     Past Surgical History:  Procedure Laterality Date  . NO PAST SURGERIES       reports that she has never smoked. She has never used smokeless tobacco. She reports previous alcohol use. She reports current drug use. Drug: Marijuana. Social History  No Known Allergies  Family History  Problem Relation Age of Onset  . Heart disease Mother   . Hypertension Mother   . Hyperlipidemia Mother   . ADD / ADHD Father   . Healthy Father   . Healthy Sister   . Liver disease Maternal Grandmother   . Diabetes Maternal Uncle   . Colon cancer Neg Hx   . Esophageal cancer Neg Hx   . Stomach cancer Neg Hx   . Rectal cancer Neg Hx      Prior to Admission medications   Medication Sig Start Date End Date Taking? Authorizing Provider  ALPRAZolam (NIRAVAM) 0.5 MG dissolvable tablet Take 1 tablet (0.5 mg total) by mouth 2 (two) times daily as needed for anxiety. 07/17/20  Yes Janeece Agee, NP  busPIRone (BUSPAR) 15 MG tablet TAKE 1 TABLET BY MOUTH AT BEDTIME 08/28/19 08/29/20 Yes Lezlie Lye, Irma M, MD  capsaicin (ZOSTRIX) 0.025 % cream Apply topically 2 (two) times daily. 07/14/20  Yes Lorre Nick, MD  escitalopram (LEXAPRO) 20 MG tablet Take 1 tablet (20  mg total) by mouth at bedtime. 08/28/19  Yes   famotidine (PEPCID) 10 MG tablet Take 1 tablet (10 mg total) by mouth 2 (two) times daily. 12/03/19 01/02/20 Yes Weinhold, Lelon MastSamantha C, CNM  medroxyPROGESTERone (DEPO-PROVERA) 150 MG/ML injection Inject 150 mg into the muscle every 3 (three) months.   Yes [provider]  ondansetron (ZOFRAN ODT) 8 MG disintegrating tablet Take 1 tablet (8 mg total) by mouth every 8 (eight) hours as needed for nausea or vomiting. 02/09/20  Yes Curatolo, Adam, DO  promethazine (PHENERGAN) 12.5  MG tablet Take 1 tablet (12.5 mg total) by mouth every 8 (eight) hours as needed for nausea or vomiting. 07/17/20  Yes Janeece AgeeMorrow, Richard, NP  QUEtiapine (SEROQUEL) 100 MG tablet TAKE 1 TABLET BY MOUTH AT BEDTIME 07/17/20 07/17/21 Yes Janeece AgeeMorrow, Richard, NP  escitalopram (LEXAPRO) 20 MG tablet TAKE 1 TABLET BY MOUTH AT BEDTIME 08/28/19 08/27/20  Lezlie LyeSantiago Lago, Meda CoffeeIrma M, MD  LORazepam (ATIVAN) 1 MG tablet Take 1 tablet (1 mg total) by mouth every 6 (six) hours as needed for anxiety. Patient not taking: No sig reported 07/15/20   McDonald, Mia A, PA-C  potassium chloride SA (KLOR-CON) 20 MEQ tablet Take 1 tablet (20 mEq total) by mouth 2 (two) times daily for 5 days. Patient not taking: No sig reported 07/15/20 07/20/20  McDonald, Mia A, PA-C  dicyclomine (BENTYL) 20 MG tablet Take 1 tablet (20 mg total) by mouth every 8 (eight) hours as needed for spasms (Abdominal cramping). Patient not taking: Reported on 04/28/2020 05/31/19 07/15/20  Ward, Layla MawKristen N, DO  esomeprazole (NEXIUM) 40 MG capsule Take 40 mg by mouth daily at 12 noon. OTC Patient not taking: Reported on 04/28/2020  07/15/20  [provider]  pantoprazole (PROTONIX) 40 MG tablet Take 1-2 tablets (40-80 mg total) by mouth daily. Patient not taking: No sig reported 10/12/18 02/09/20  Lezlie LyeSantiago Lago, Meda CoffeeIrma M, MD  prochlorperazine (COMPAZINE) 10 MG/2ML injection Inject 2 mLs (10 mg total) into the vein every 6 (six) hours as needed. Patient not taking: No sig reported 12/17/19 02/09/20  Constant, Gigi GinPeggy, MD    Physical Exam: Vitals:   07/20/20 2015 07/20/20 2124 07/20/20 2200 07/20/20 2300  BP: (!) 141/91 139/84 (!) 142/82 127/82  Pulse: 70 90 72 73  Resp:  17  16  Temp:      TempSrc:      SpO2: 100% 100% 100% 100%    Constitutional: NAD, fatigue appearing young female laying asleep flat in bed Vitals:   07/20/20 2015 07/20/20 2124 07/20/20 2200 07/20/20 2300  BP: (!) 141/91 139/84 (!) 142/82 127/82  Pulse: 70 90 72 73  Resp:  17  16  Temp:       TempSrc:      SpO2: 100% 100% 100% 100%   Eyes: PERRL, lids and conjunctivae normal ENMT: Mucous membranes are moist. Neck: normal, supple Respiratory: clear to auscultation bilaterally, no wheezing, no crackles. Normal respiratory effort.   Cardiovascular: Regular rate and rhythm, no murmurs / rubs / gallops. No extremity edema. 2+ pedal pulses. No carotid bruits.  Abdomen: no tenderness, no masses palpated.  Bowel sounds positive.  Musculoskeletal: no clubbing / cyanosis. No joint deformity upper and lower extremities. Good ROM, no contractures. Normal muscle tone.  Skin: no rashes, lesions, ulcers. No induration Neurologic: CN 2-12 grossly intact. Sensation intact,Strength 5/5 in all 4.  Psychiatric: Normal judgment and insight. Alert and oriented x 3. Normal mood.     Labs on Admission: I have personally reviewed  following labs and imaging studies  CBC: Recent Labs  Lab 07/14/20 0941 07/15/20 0144 07/20/20 2007  WBC 14.5* 14.6* 16.1*  NEUTROABS 12.3*  --   --   HGB 13.7 12.7 14.1  HCT 41.7 39.2 42.7  MCV 86.0 86.9 85.9  PLT 318 288 300   Basic Metabolic Panel: Recent Labs  Lab 07/14/20 0941 07/15/20 0144 07/20/20 2007 07/20/20 2139  NA 138 141 135  --   K 3.3* 3.0* 2.7*  --   CL 103 105 97*  --   CO2 23 25 29   --   GLUCOSE 136* 141* 99  --   BUN 14 13 13   --   CREATININE 0.71 0.81 0.97  --   CALCIUM 10.1 9.2 9.2  --   MG  --  2.0  --  2.2   GFR: Estimated Creatinine Clearance: 91.9 mL/min (by C-G formula based on SCr of 0.97 mg/dL). Liver Function Tests: Recent Labs  Lab 07/14/20 0941 07/15/20 0144 07/20/20 2007  AST 24 26 14*  ALT 22 21 24   ALKPHOS 66 55 53  BILITOT 0.2* 0.6 0.8  PROT 9.0* 8.2* 8.0  ALBUMIN 5.0 4.5 4.6   Recent Labs  Lab 07/14/20 0941 07/15/20 0144 07/20/20 2008  LIPASE 24 30 37   No results for input(s): AMMONIA in the last 168 hours. Coagulation Profile: No results for input(s): INR, PROTIME in the last 168  hours. Cardiac Enzymes: No results for input(s): CKTOTAL, CKMB, CKMBINDEX, TROPONINI in the last 168 hours. BNP (last 3 results) No results for input(s): PROBNP in the last 8760 hours. HbA1C: No results for input(s): HGBA1C in the last 72 hours. CBG: No results for input(s): GLUCAP in the last 168 hours. Lipid Profile: No results for input(s): CHOL, HDL, LDLCALC, TRIG, CHOLHDL, LDLDIRECT in the last 72 hours. Thyroid Function Tests: No results for input(s): TSH, T4TOTAL, FREET4, T3FREE, THYROIDAB in the last 72 hours. Anemia Panel: No results for input(s): VITAMINB12, FOLATE, FERRITIN, TIBC, IRON, RETICCTPCT in the last 72 hours. Urine analysis:    Component Value Date/Time   COLORURINE YELLOW 07/20/2020 2007   APPEARANCEUR HAZY (A) 07/20/2020 2007   LABSPEC 1.025 07/20/2020 2007   PHURINE 6.0 07/20/2020 2007   GLUCOSEU NEGATIVE 07/20/2020 2007   HGBUR NEGATIVE 07/20/2020 2007   BILIRUBINUR NEGATIVE 07/20/2020 2007   BILIRUBINUR negative 02/21/2018 0934   KETONESUR 80 (A) 07/20/2020 2007   PROTEINUR NEGATIVE 07/20/2020 2007   UROBILINOGEN 0.2 02/21/2018 0934   NITRITE NEGATIVE 07/20/2020 2007   LEUKOCYTESUR NEGATIVE 07/20/2020 2007    Radiological Exams on Admission: No results found.    Assessment/Plan  Cyclic vomiting with intractable nasusea/vomiting secondary to cannabis use  - PRN IV Compazine with PRN IV phenergan if refractory - Also could use IV ativan sparingly - strongly enourage cessation of cannabis - agree with outpatient GI follow up   Hypokalemia -repleted with IV K   Anxiety/depression - Continue BuSpar, Lexapro and Seroquel  DVT prophylaxis:.Lovenox Code Status: Full Family Communication: Plan discussed with patient at bedside  disposition Plan: Home with observation Consults called:  Admission status: Observation  Level of care: Med-Surg  Status is: Observation  The patient remains OBS appropriate and will d/c before 2  midnights.  Dispo: The patient is from: Home              Anticipated d/c is to: Home              Patient currently is not medically stable to d/c.  Difficult to place patient No         Anselm Jungling DO [facility]   If 7PM-7AM, please contact night-coverage [email or web address]   07/21/2020, 12:34 AM

## 2020-07-21 NOTE — Plan of Care (Signed)
Plan of care dicussed 

## 2020-07-21 NOTE — ED Notes (Signed)
Call report  

## 2020-07-21 NOTE — ED Notes (Signed)
Call to Lab to add UDS to urine that was sent at 8pm

## 2020-07-22 DIAGNOSIS — R112 Nausea with vomiting, unspecified: Secondary | ICD-10-CM | POA: Diagnosis present

## 2020-07-22 LAB — COMPREHENSIVE METABOLIC PANEL
ALT: 20 U/L (ref 0–44)
AST: 14 U/L — ABNORMAL LOW (ref 15–41)
Albumin: 4.3 g/dL (ref 3.5–5.0)
Alkaline Phosphatase: 48 U/L (ref 38–126)
Anion gap: 9 (ref 5–15)
BUN: <5 mg/dL — ABNORMAL LOW (ref 6–20)
CO2: 24 mmol/L (ref 22–32)
Calcium: 9.1 mg/dL (ref 8.9–10.3)
Chloride: 99 mmol/L (ref 98–111)
Creatinine, Ser: 0.76 mg/dL (ref 0.44–1.00)
GFR, Estimated: >60 mL/min (ref >60)
Glucose, Bld: 121 mg/dL — ABNORMAL HIGH (ref 70–99)
Potassium: 3.1 mmol/L — ABNORMAL LOW (ref 3.5–5.1)
Sodium: 132 mmol/L — ABNORMAL LOW (ref 135–145)
Total Bilirubin: 1.3 mg/dL — ABNORMAL HIGH (ref 0.3–1.2)
Total Protein: 7.5 g/dL (ref 6.5–8.1)

## 2020-07-22 LAB — CBC WITH DIFFERENTIAL/PLATELET
Abs Immature Granulocytes: 0.05 10*3/uL (ref 0.00–0.07)
Basophils Absolute: 0 10*3/uL (ref 0.0–0.1)
Basophils Relative: 0 %
Eosinophils Absolute: 0 10*3/uL (ref 0.0–0.5)
Eosinophils Relative: 0 %
HCT: 41.1 % (ref 36.0–46.0)
Hemoglobin: 13.3 g/dL (ref 12.0–15.0)
Immature Granulocytes: 0 %
Lymphocytes Relative: 14 %
Lymphs Abs: 1.7 10*3/uL (ref 0.7–4.0)
MCH: 27.7 pg (ref 26.0–34.0)
MCHC: 32.4 g/dL (ref 30.0–36.0)
MCV: 85.6 fL (ref 80.0–100.0)
Monocytes Absolute: 0.9 10*3/uL (ref 0.1–1.0)
Monocytes Relative: 8 %
Neutro Abs: 9.2 10*3/uL — ABNORMAL HIGH (ref 1.7–7.7)
Neutrophils Relative %: 78 %
Platelets: 257 10*3/uL (ref 150–400)
RBC: 4.8 MIL/uL (ref 3.87–5.11)
RDW: 12.3 % (ref 11.5–15.5)
WBC: 11.9 10*3/uL — ABNORMAL HIGH (ref 4.0–10.5)
nRBC: 0 % (ref 0.0–0.2)

## 2020-07-22 LAB — HEMOGLOBIN A1C
Hgb A1c MFr Bld: 5.6 % (ref 4.8–5.6)
Mean Plasma Glucose: 114 mg/dL

## 2020-07-22 LAB — PHOSPHORUS: Phosphorus: 2.9 mg/dL (ref 2.5–4.6)

## 2020-07-22 LAB — MAGNESIUM: Magnesium: 2 mg/dL (ref 1.7–2.4)

## 2020-07-22 MED ORDER — POTASSIUM CHLORIDE 10 MEQ/100ML IV SOLN
10.0000 meq | INTRAVENOUS | Status: AC
  Administered 2020-07-22 (×4): 10 meq via INTRAVENOUS
  Filled 2020-07-22 (×3): qty 100

## 2020-07-22 MED ORDER — MORPHINE SULFATE (PF) 2 MG/ML IV SOLN
2.0000 mg | INTRAVENOUS | Status: DC | PRN
Start: 2020-07-22 — End: 2020-07-24
  Administered 2020-07-22 – 2020-07-23 (×6): 2 mg via INTRAVENOUS
  Filled 2020-07-22 (×7): qty 1

## 2020-07-22 MED ORDER — HALOPERIDOL LACTATE 5 MG/ML IJ SOLN
1.0000 mg | Freq: Two times a day (BID) | INTRAMUSCULAR | Status: DC | PRN
  Administered 2020-07-22 – 2020-07-23 (×3): 1 mg via INTRAVENOUS
  Filled 2020-07-22 (×4): qty 1

## 2020-07-22 MED ORDER — SODIUM CHLORIDE 0.9 % IV SOLN
8.0000 mg | Freq: Three times a day (TID) | INTRAVENOUS | Status: DC
  Administered 2020-07-22 – 2020-07-24 (×6): 8 mg via INTRAVENOUS
  Filled 2020-07-22 (×10): qty 4

## 2020-07-22 MED ORDER — SODIUM CHLORIDE 0.9 % IV SOLN
8.0000 mg | Freq: Four times a day (QID) | INTRAVENOUS | Status: DC
  Filled 2020-07-22: qty 4

## 2020-07-22 MED ORDER — HALOPERIDOL LACTATE 5 MG/ML IJ SOLN: 1.0000 mg | Freq: Four times a day (QID) | INTRAMUSCULAR | Status: DC | PRN

## 2020-07-22 MED ORDER — POTASSIUM CHLORIDE 10 MEQ/100ML IV SOLN
INTRAVENOUS | Status: AC
  Administered 2020-07-22: 10 meq
  Filled 2020-07-22: qty 100

## 2020-07-22 MED ORDER — KCL IN DEXTROSE-NACL 40-5-0.45 MEQ/L-%-% IV SOLN
INTRAVENOUS | Status: DC
  Filled 2020-07-22 (×3): qty 1000

## 2020-07-22 NOTE — Plan of Care (Signed)
  Problem: Activity: Goal: Risk for activity intolerance will decrease Outcome: Progressing   Problem: Nutrition: Goal: Adequate nutrition will be maintained Outcome: Progressing   

## 2020-07-22 NOTE — Consult Note (Signed)
HPI: Diana Hauk Carteris a 25 y.o.femalewith medical history significant forcyclic vomiting, cannabis use, anxiety and GERD who presents with persistent nausea and vomiting.  About a week ago she began to have acute intractable nausea and vomiting throughout the day. Has epigastric abdominal pain worse with vomiting. Denies any diarrhea. No fever. No prior abdominal surgical history. Patient endorsed daily cannabis use. States she has been having cyclic vomiting since about [id number] which is when she started to use cannabois.states she as a habit and not for any medical reasons. No other drug or alcohol use.  She has been evaluated in the ED for about 2 times in the past month for similar presentation.Had improvement with short course of PO Haldol.She had abdominal ultrasound on 5/30 that was negative. Has GI referral planned outpatient.  Psych consult placed for cyclic vomiting-on Lexapro, Seroquel, BuSpar-Geodon want to consider adding TCA for cyclic vomiting.  Patient notes she is doing best on Lexapro and does not want to discontinue.  Recommendation regarding possibility of adding amitriptyline to her regimen.  Discussed with patient via telephone reason for consult. She states she has a history of depression, currently describes it as in remission. She reports being on Lexapro 4 years, and has seemingly worked well for her. She does not have an outpatient behavioral health provider however she sees her PCP Janeece Agee, NP who has been prescribing her Lexapro, Seroquel and Busapr. She denies any depressive symptoms to include sadness, anhedonia, worthlessness, hopelessness, guilty, suicidal thoughts and or insomnia. She states her mood is not the best as it relates to her cyclic vomiting, but no depressive symptoms. She expresses much interest in wishing to continue this medication. Discussed with patient she can safely take both medications, although she will require monitoring by her PCP  to include EKG.  Discussed her cannabis use which she has been using recreational for quite some time. Prior to this admission she was smoking about 5 blunts a day.   25 year old female who was admitted for cyclic vomiting, history of depression, and anxiety, with chronic cannabis use.  Psych consult was placed for medication recommendations, as gastroenterology wishes to add amitriptyline to her daily medication regimen.  Patient denies any current depressive symptoms, reports compliance with her psychiatric medications which are currently being prescribed by her primary care provider.  She denies any other current outpatient behavioral health services.  Patient is currently taking Lexapro 20 mg, Seroquel 100 mg, and buspirone 15 mg p.o. 3 times daily.  Patient is able to continue her current medication regimen, in addition to adding amitriptyline for management of cyclic vomiting.  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  In the interim will obtain EKG to assess for QTC prolongation and obtain baseline prior to initiating a TCA.  Last EKG obtained shows QTC of 405.  -It is safe to start TCA, in conjunction with psychotropic medications. Routine monitoring will be required for QTc prolongation and CNS depression. Reviewed side effects wth patient to include photosenitivity  and urine discoloration, slowed speech, lightheadedness, and drowsiness.  -Recommend referral to substance abuse intensive outpatient programming, as patient has been smoking cannabis for quite some time and will benefit from outpatient therapy. -Suggest the cessation of marijuana. Continue current medications. -EKG ordered -Recommend follow up with PCP for ongoing management of medications and QTc.   Psychiatry to sign off at this time.

## 2020-07-22 NOTE — Plan of Care (Signed)
Care plan reviewed and discussed with the patient. 

## 2020-07-22 NOTE — Progress Notes (Signed)
Progress Note  Chief Complaint: nausea and vomiting         ASSESSMENT / PLAN:    # 25 yo female with acute on chronic nausea / vomiting. Prior evaluation has included RUQ x 2, CT scan , EGD. She smokes THC four times daily and this is is likely contributing to, or is the sole cause of her chronic nausea. However, she has had nearly intractable vomiting over the last several days so other etiologies not excluded. She says this flare in symptoms is not any different in severity, just duration(she typically feels better after 3 days) --She understands that [facility] is very likely cause of chronic nausea. She wants to stop using it.   --She doesn't feel like Buspar helps GI symptoms but I am reluctant to stopped it right now out of concern that it may cause some rebound anxiety.  --If discontinuation of THC doesn't help then we can consider Elavil as outpatient. [facility] contacted me regarding this and feels that she should probably be tapered off Lexapro if going to start Elavil though she feels like Lexapro is beneficial for her. She is also on Seroquel. At some point me may have to involve Psych to help manage SSRI, atypical antipsychotic med and need for a TCA.   --Haldol + Ativan have provided the most relief but she still had breakthrough N/V after drinking juice this am. TR[facility] has increased Zofran dose.    # Chronic, intermittent constipation. She hasn't had a BM in a week but then has had minimal PO during that time. Since constipation known to exacerbate her N/V will give a suppository. She cannot tolerate Dulcolax suppository so will give glycerin.   # GERD, manages at home with Pepcid. Currently not on anything for GERD in hospital. With vomiting she is at risk for esophagitis.  --Will give IV protonix Q 24 hours.   # ? IBS. Bowel habits are mildly irregular and she has abdominal pain relieved with defecation.   --She has never tried fiber on a consistent basis. Upon discharge when acute  issues have resolve she can try daily Benefiber.   # Chronic leukocytosis. ED labs dating back to 2019 have shown WBC count elevated up to 18K at times. Most if not all these times patient was in ED for nausea / vomiting. Reactive ? WBD this admission up to 15, down to 11.9 today.       SUBJECTIVE:   Feels terrible. Ativan, Haldol and shower all that make the nausea / vomiting better. Vomited the half cup of juice that she had earlier this am.     OBJECTIVE:    Scheduled inpatient medications:  . busPIRone  15 mg Oral QHS  . enoxaparin (LOVENOX) injection  40 mg Subcutaneous Q24H  . escitalopram  20 mg Oral QHS  . feeding supplement  1 Container Oral TID BM  . ondansetron (ZOFRAN) IV  4 mg Intravenous Q6H  . pantoprazole (PROTONIX) IV  40 mg Intravenous Q24H  . QUEtiapine  100 mg Oral QHS   Continuous inpatient infusions:  . dextrose 5% lactated ringers with KCl 20 mEq/L 100 mL/hr at 07/22/20 0516  . promethazine (PHENERGAN) injection (IM or IVPB) Stopped (07/21/20 1630)   PRN inpatient medications: LORazepam, promethazine **OR** promethazine (PHENERGAN) injection (IM or IVPB) **OR** promethazine  Vital signs in last 24 hours: Temp:  [97.7 F (36.5 C)-98.4 F (36.9 C)] 97.7 F (36.5 C) (06/07 0534) Pulse Rate:  [68-86] 86 (06/07 0534) Resp:  [  18] 18 (06/07 0534) BP: (150-168)/(92-98) 168/98 (06/07 0534) SpO2:  [100 %] 100 % (06/07 0534) Last BM Date: 07/20/20  Intake/Output Summary (Last 24 hours) at 07/22/2020 0905 Last data filed at 07/22/2020 0600 Gross per 24 hour  Intake 2110.2 ml  Output 1750 ml  Net 360.2 ml     Physical Exam:  . General: Alert *female in NAD . Heart:  Regular rate and rhythm. No lower extremity edema . Pulmonary: Normal respiratory effort . Abdomen: Soft, nondistended, nontender. Normal bowel sounds.  . Neurologic: Alert and oriented . Psych: Pleasant. Cooperative.   Filed Weights   07/21/20 0237  Weight: 81.2 kg    Intake/Output from  previous day: 06/06 0701 - 06/07 0700 In: 2160.2 [P.O.:990; I.V.:1070.1; IV Piggyback:100.1] Out: 1750 [Urine:1000; Emesis/NG output:750] Intake/Output this shift: No intake/output data recorded.    Lab Results: Recent Labs    07/20/20 2007 07/22/20 0334  WBC 16.1* 11.9*  HGB 14.1 13.3  HCT 42.7 41.1  PLT 300 257   BMET Recent Labs    07/20/20 2007 07/21/20 0322 07/22/20 0334  NA 135 135 132*  K 2.7* 3.2* 3.1*  CL 97* 103 99  CO2 29 23 24   GLUCOSE 99 96 121*  BUN 13 9 <5*  CREATININE 0.97 0.72 0.76  CALCIUM 9.2 8.4* 9.1   LFT Recent Labs    07/22/20 0334  PROT 7.5  ALBUMIN 4.3  AST 14*  ALT 20  ALKPHOS 48  BILITOT 1.3*   PT/INR No results for input(s): LABPROT, INR in the last 72 hours. Hepatitis Panel No results for input(s): HEPBSAG, HCVAB, HEPAIGM, HEPBIGM in the last 72 hours.  No results found.    Principal Problem:   Cyclical vomiting Active Problems:   Generalized anxiety disorder   Hypokalemia   Intractable vomiting     LOS: 0 days   09/21/20 ,NP 07/22/2020, 9:05 AM

## 2020-07-22 NOTE — Progress Notes (Signed)
IV came out, Assessed for site to insert new IV, unable to find any good vein, patient states she is hard stick and would like IV team to do it. IV team consulted.

## 2020-07-22 NOTE — Progress Notes (Signed)
Pt. Is expressing the need/desire to speak with someone re: her sadness and feeling so depressed at this time.  Denies the desire to harm herself and says she would like to talk with someone from psych, but "(she) doesn't want to be IVC'd.  I spent about 20 min. With her, offering encouragement, therapeutic touch, and support, and assured her I would convey her desire to speak to a psych provider.

## 2020-07-22 NOTE — Progress Notes (Signed)
Noted alarm and nurse in room. Pt had IV pump on pause mode, stated her IV site hurts her. Site clear, no infiltration or infection noted. Notified pharmacy and and suggested decrease rate to 12ml/hr.

## 2020-07-22 NOTE — Progress Notes (Signed)
PROGRESS NOTE    Diana Thomas  CZY:[id number] DOB: 10/17/1995 DOA: 07/20/2020 PCP: Janeece Agee, NP  Chief Complaint  Patient presents with  . Abdominal Pain    Brief Narrative:  Diana Thomas is Diana Thomas 25 y.o. female with medical history significant for cyclic vomiting, cannabis use, anxiety and GERD who presents with persistent nausea and vomiting.  About Diana Thomas week ago she began to have acute intractable nausea and vomiting throughout the day.  Has epigastric abdominal pain worse with vomiting.  Denies any diarrhea.  No fever.  No prior abdominal surgical history.  Patient endorsed daily cannabis use.  States she has been having cyclic vomiting since about 0932 which is when she started to use cannabois. states she as Diana Thomas habit and not for any medical reasons. No other drug or alcohol use.   She has been evaluated in the ED for about 2 times in the past month for similar presentation. Had improvement with short course of PO Haldol. She had abdominal ultrasound on 5/30 that was negative.  Has GI referral planned outpatient.  Assessment & Plan:   Principal Problem:   Cyclical vomiting Active Problems:   Generalized anxiety disorder   Hypokalemia   Intractable vomiting  Cyclic vomiting with intractable nasusea/vomiting - suspect this is 2/2 MJ use (notes desire for hot showers with discomfort) - scheduled zofran, phenergan (increase zofran to 8 mg) - can use ativan and/or haldol sparingly for nausea/vomiting unresponsive to zofran/phenergan (discussed haldol adverse reactions, risk - qtc appropriate) - daily EKG for QTc with multiple QT prolonging meds - strongly enourage cessation of cannabis - she notes willingness to do this - appreciate GI recs -  PPI - encouraged stopping MJ - I think discussion of amitriptyline would be outpatient discussion as she's on lexapro)  Hypokalemia -MIVF with K[facility]  Anxiety/depression - Lexapro and Seroquel - buspar, GI planning on starting  amitriptyline (probably need to cross taper with lexapro - would defer question of this to PCP/prescriber outpatient)  DVT prophylaxis: lovenox Code Status: full  Family Communication: none at bedside - mother on phone  Disposition:   Status is: Observation  The patient remains OBS appropriate and will d/c before 2 midnights.  Dispo: The patient is from: Home              Anticipated d/c is to: Home              Patient currently is not medically stable to d/c.   Difficult to place patient No  Consultants:   GI  Procedures:  None   Antimicrobials: Anti-infectives (From admission, onward)   None         Subjective: Still feels poorly Asking when she'll be able to go home   Objective: Vitals:   07/21/20 0646 07/21/20 0900 07/21/20 2149 07/22/20 0534  BP: (!) 152/98 (!) 144/84 (!) 150/92 (!) 168/98  Pulse: 97 82 68 86  Resp: 19 16 18 18   Temp: 98.9 F (37.2 C) 98.5 F (36.9 C) 98.4 F (36.9 C) 97.7 F (36.5 C)  TempSrc: Oral Oral Oral Oral  SpO2: 100% 100% 100% 100%  Weight:      Height:        Intake/Output Summary (Last 24 hours) at 07/22/2020 0934 Last data filed at 07/22/2020 0600 Gross per 24 hour  Intake 2110.2 ml  Output 1750 ml  Net 360.2 ml   Filed Weights   07/21/20 0237  Weight: 81.2 kg    Examination:  General:  No acute distress. Cardiovascular: Heart sounds show Diana Thomas regular rate, and rhythm.  Lungs: CTAB Abdomen: Soft, nontender, nondistended  Neurological: Alert and oriented 3. Moves all extremities 4. Cranial nerves II through XII grossly intact. Skin: Warm and dry. No rashes or lesions. Extremities: No clubbing or cyanosis. No edema.   Data Reviewed: I have personally reviewed following labs and imaging studies  CBC: Recent Labs  Lab 07/20/20 2007 07/22/20 0334  WBC 16.1* 11.9*  NEUTROABS  --  9.2*  HGB 14.1 13.3  HCT 42.7 41.1  MCV 85.9 85.6  PLT 300 257    Basic Metabolic Panel: Recent Labs  Lab 07/20/20 2007  07/20/20 2139 07/21/20 0322 07/22/20 0334  NA 135  --  135 132*  K 2.7*  --  3.2* 3.1*  CL 97*  --  103 99  CO2 29  --  23 24  GLUCOSE 99  --  96 121*  BUN 13  --  9 <5*  CREATININE 0.97  --  0.72 0.76  CALCIUM 9.2  --  8.4* 9.1  MG  --  2.2  --  2.0  PHOS  --   --   --  2.9    GFR: Estimated Creatinine Clearance: 111.2 mL/min (by C-G formula based on SCr of 0.76 mg/dL).  Liver Function Tests: Recent Labs  Lab 07/20/20 2007 07/22/20 0334  AST 14* 14*  ALT 24 20  ALKPHOS 53 48  BILITOT 0.8 1.3*  PROT 8.0 7.5  ALBUMIN 4.6 4.3    CBG: No results for input(s): GLUCAP in the last 168 hours.   Recent Results (from the past 240 hour(s))  SARS CORONAVIRUS 2 (TAT 6-24 HRS) Nasopharyngeal Nasopharyngeal Swab     Status: None   Collection Time: 07/21/20 12:40 AM   Specimen: Nasopharyngeal Swab  Result Value Ref Range Status   SARS Coronavirus 2 NEGATIVE NEGATIVE Final    Comment: (NOTE) SARS-CoV-2 target nucleic acids are NOT DETECTED.  The SARS-CoV-2 RNA is generally detectable in upper and lower respiratory specimens during the acute phase of infection. Negative results do not preclude SARS-CoV-2 infection, do not rule out co-infections with other pathogens, and should not be used as the sole basis for treatment or other patient management decisions. Negative results must be combined with clinical observations, patient history, and epidemiological information. The expected result is Negative.  Fact Sheet for Patients: HairSlick.no  Fact Sheet for Healthcare Providers: quierodirigir.com  This test is not yet approved or cleared by the [place] FDA and  has been authorized for detection and/or diagnosis of SARS-CoV-2 by FDA under an Emergency Use Authorization (EUA). This EUA will remain  in effect (meaning this test can be used) for the duration of the COVID-19 declaration under Se ction 564(b)(1) of the  Act, 21 U.S.C. section 360bbb-3(b)(1), unless the authorization is terminated or revoked sooner.  Performed at [facility], [address], [place], [place] [address]          Radiology Studies: No results found.      Scheduled Meds: . busPIRone  15 mg Oral QHS  . enoxaparin (LOVENOX) injection  40 mg Subcutaneous Q24H  . escitalopram  20 mg Oral QHS  . feeding supplement  1 Container Oral TID BM  . pantoprazole (PROTONIX) IV  40 mg Intravenous Q24H  . QUEtiapine  100 mg Oral QHS   Continuous Infusions: . dextrose 5% lactated ringers with KCl 20 mEq/L 100 mL/hr at 07/22/20 0516  . ondansetron (ZOFRAN)  IV    . promethazine (PHENERGAN) injection (IM or IVPB) Stopped (07/21/20 1630)     LOS: 0 days    Time spent: over 30 min    Lacretia Nicks, MD [facility]   To contact the attending provider between 7A-7P or the covering provider during after hours 7P-7A, please log into the web site [email or web address] and access using universal [place] password for that web site. If you do not have the password, please call the hospital operator.  07/22/2020, 9:34 AM

## 2020-07-23 ENCOUNTER — Inpatient Hospital Stay (HOSPITAL_COMMUNITY): Payer: Self-pay | Admitting: Anesthesiology

## 2020-07-23 ENCOUNTER — Encounter (HOSPITAL_COMMUNITY)
Admission: EM | Disposition: A | Discharge status: Home / Self Care | Payer: Self-pay | Source: Home / Self Care | Attending: Family Medicine

## 2020-07-23 ENCOUNTER — Encounter (HOSPITAL_COMMUNITY): Payer: Self-pay | Admitting: Family Medicine

## 2020-07-23 DIAGNOSIS — R112 Nausea with vomiting, unspecified: Principal | ICD-10-CM

## 2020-07-23 DIAGNOSIS — K2900 Acute gastritis without bleeding: Secondary | ICD-10-CM

## 2020-07-23 DIAGNOSIS — R1115 Cyclical vomiting syndrome unrelated to migraine: Secondary | ICD-10-CM

## 2020-07-23 DIAGNOSIS — F1290 Cannabis use, unspecified, uncomplicated: Secondary | ICD-10-CM

## 2020-07-23 DIAGNOSIS — K209 Esophagitis, unspecified without bleeding: Secondary | ICD-10-CM

## 2020-07-23 HISTORY — PX: BIOPSY: SHX5522

## 2020-07-23 HISTORY — PX: ESOPHAGOGASTRODUODENOSCOPY (EGD) WITH PROPOFOL: SHX5813

## 2020-07-23 LAB — CBC WITH DIFFERENTIAL/PLATELET
Abs Immature Granulocytes: 0.05 10*3/uL (ref 0.00–0.07)
Basophils Absolute: 0 10*3/uL (ref 0.0–0.1)
Basophils Relative: 0 %
Eosinophils Absolute: 0 10*3/uL (ref 0.0–0.5)
Eosinophils Relative: 0 %
HCT: 40.8 % (ref 36.0–46.0)
Hemoglobin: 13.3 g/dL (ref 12.0–15.0)
Immature Granulocytes: 0 %
Lymphocytes Relative: 24 %
Lymphs Abs: 2.9 10*3/uL (ref 0.7–4.0)
MCH: 28.2 pg (ref 26.0–34.0)
MCHC: 32.6 g/dL (ref 30.0–36.0)
MCV: 86.6 fL (ref 80.0–100.0)
Monocytes Absolute: 0.9 10*3/uL (ref 0.1–1.0)
Monocytes Relative: 7 %
Neutro Abs: 8.5 10*3/uL — ABNORMAL HIGH (ref 1.7–7.7)
Neutrophils Relative %: 69 %
Platelets: 266 10*3/uL (ref 150–400)
RBC: 4.71 MIL/uL (ref 3.87–5.11)
RDW: 12.7 % (ref 11.5–15.5)
WBC: 12.5 10*3/uL — ABNORMAL HIGH (ref 4.0–10.5)
nRBC: 0 % (ref 0.0–0.2)

## 2020-07-23 LAB — COMPREHENSIVE METABOLIC PANEL
ALT: 18 U/L (ref 0–44)
AST: 12 U/L — ABNORMAL LOW (ref 15–41)
Albumin: 4.1 g/dL (ref 3.5–5.0)
Alkaline Phosphatase: 53 U/L (ref 38–126)
Anion gap: 8 (ref 5–15)
BUN: <5 mg/dL — ABNORMAL LOW (ref 6–20)
CO2: 23 mmol/L (ref 22–32)
Calcium: 9.4 mg/dL (ref 8.9–10.3)
Chloride: 103 mmol/L (ref 98–111)
Creatinine, Ser: 0.72 mg/dL (ref 0.44–1.00)
GFR, Estimated: >60 mL/min (ref >60)
Glucose, Bld: 119 mg/dL — ABNORMAL HIGH (ref 70–99)
Potassium: 3.5 mmol/L (ref 3.5–5.1)
Sodium: 134 mmol/L — ABNORMAL LOW (ref 135–145)
Total Bilirubin: 0.9 mg/dL (ref 0.3–1.2)
Total Protein: 7.2 g/dL (ref 6.5–8.1)

## 2020-07-23 LAB — MAGNESIUM: Magnesium: 1.8 mg/dL (ref 1.7–2.4)

## 2020-07-23 LAB — PHOSPHORUS: Phosphorus: 2.6 mg/dL (ref 2.5–4.6)

## 2020-07-23 SURGERY — ESOPHAGOGASTRODUODENOSCOPY (EGD) WITH PROPOFOL
Anesthesia: Monitor Anesthesia Care

## 2020-07-23 MED ORDER — PROSOURCE PLUS PO LIQD
30.0000 mL | Freq: Two times a day (BID) | ORAL | Status: DC
  Filled 2020-07-23: qty 30

## 2020-07-23 MED ORDER — AMITRIPTYLINE HCL 50 MG PO TABS
50.0000 mg | ORAL_TABLET | Freq: Every day | ORAL | Status: DC
  Filled 2020-07-23 (×2): qty 1

## 2020-07-23 MED ORDER — ADULT MULTIVITAMIN W/MINERALS CH: 1.0000 | ORAL_TABLET | Freq: Every day | ORAL | Status: DC

## 2020-07-23 MED ORDER — ONDANSETRON HCL 4 MG/2ML IJ SOLN
4.0000 mg | Freq: Once | INTRAMUSCULAR | Status: AC
  Administered 2020-07-23: 4 mg via INTRAVENOUS

## 2020-07-23 MED ORDER — LIDOCAINE 2% (20 MG/ML) 5 ML SYRINGE
INTRAMUSCULAR | Status: DC | PRN
  Administered 2020-07-23: 60 mg via INTRAVENOUS

## 2020-07-23 MED ORDER — LACTATED RINGERS IV SOLN: INTRAVENOUS | Status: DC

## 2020-07-23 MED ORDER — SUMATRIPTAN SUCCINATE 6 MG/0.5ML ~~LOC~~ SOLN
6.0000 mg | Freq: Once | SUBCUTANEOUS | Status: AC
  Administered 2020-07-23: 6 mg via SUBCUTANEOUS
  Filled 2020-07-23: qty 0.5

## 2020-07-23 MED ORDER — ONDANSETRON HCL 4 MG/2ML IJ SOLN
INTRAMUSCULAR | Status: AC
  Filled 2020-07-23: qty 2

## 2020-07-23 MED ORDER — AMITRIPTYLINE HCL 25 MG PO TABS: 25.0000 mg | ORAL_TABLET | Freq: Every day | ORAL | Status: DC

## 2020-07-23 MED ORDER — PROPOFOL 500 MG/50ML IV EMUL
INTRAVENOUS | Status: AC
  Filled 2020-07-23: qty 50

## 2020-07-23 MED ORDER — PROPOFOL 10 MG/ML IV BOLUS
INTRAVENOUS | Status: DC | PRN
  Administered 2020-07-23: 20 mg via INTRAVENOUS

## 2020-07-23 MED ORDER — PROPOFOL 500 MG/50ML IV EMUL
INTRAVENOUS | Status: DC | PRN
  Administered 2020-07-23: 150 ug/kg/min via INTRAVENOUS

## 2020-07-23 MED ORDER — GLYCERIN (LAXATIVE) 2.1 G RE SUPP
1.0000 | Freq: Every day | RECTAL | Status: DC | PRN
  Filled 2020-07-23: qty 1

## 2020-07-23 SURGICAL SUPPLY — 15 items

## 2020-07-23 NOTE — Plan of Care (Signed)
  Problem: Activity: Goal: Risk for activity intolerance will decrease Outcome: Progressing   Problem: Pain Managment: Goal: General experience of comfort will improve Outcome: Progressing   Problem: Safety: Goal: Ability to remain free from injury will improve Outcome: Progressing   

## 2020-07-23 NOTE — Progress Notes (Signed)
PROGRESS NOTE    Diana Thomas  CWC:[id number] DOB: 1995/12/19 DOA: 07/20/2020 PCP: Janeece Agee, NP  Chief Complaint  Patient presents with  . Abdominal Pain    Brief Narrative:  Diana Thomas is a 25 y.o. female with medical history significant for cyclic vomiting, cannabis use, anxiety and GERD who presents with persistent nausea and vomiting.  About a week ago she began to have acute intractable nausea and vomiting throughout the day.  Has epigastric abdominal pain worse with vomiting.  Denies any diarrhea.  No fever.  No prior abdominal surgical history.  Patient endorsed daily cannabis use.  States she has been having cyclic vomiting since about 7616 which is when she started to use cannabois. states she as a habit and not for any medical reasons. No other drug or alcohol use.   She has been evaluated in the ED for about 2 times in the past month for similar presentation. Had improvement with short course of PO Haldol. She had abdominal ultrasound on 5/30 that was negative.  Has GI referral planned outpatient.  Assessment & Plan:   Principal Problem:   Cyclical vomiting Active Problems:   Generalized anxiety disorder   Hypokalemia   Intractable vomiting   Nausea & vomiting   Acute esophagitis   Acute gastritis without hemorrhage  Cyclic vomiting with intractable nasusea/vomiting - suspect this is 2/2 MJ use (notes desire for hot showers with discomfort) - scheduled zofran, phenergan (increase zofran to 8 mg) - can use ativan and/or haldol sparingly for nausea/vomiting unresponsive to zofran/phenergan (discussed haldol adverse reactions, risk - qtc appropriate) - daily EKG for QTc with multiple QT prolonging meds - strongly enourage cessation of cannabis - she notes willingness to do this - appreciate GI recs -  PPI - encouraged stopping MJ  -started on amitriptyline for prophylactic therapy -will give a dose of sumatriptan, since she has not tried this in the past  and imitrex is first line for abortive therapy -continue on clear liquids for now, advance as tolerated  Hypokalemia -MIVF with KCL  Anxiety/depression - Lexapro and Seroquel - buspar, started on amitriptyline per GI  -will need follow up with PCP to follow QTc  DVT prophylaxis: lovenox Code Status: full  Family Communication: none at bedside - mother on phone  Disposition:   Status is: Inpatient  The patient will require care spanning > 2 midnights and should be moved to inpatient because: Ongoing active pain requiring inpatient pain management and IV treatments appropriate due to intensity of illness or inability to take PO  Dispo: The patient is from: Home              Anticipated d/c is to: Home              Patient currently is not medically stable to d/c.   Difficult to place patient No  Consultants:   GI  Psychiatry  Procedures: 6/8 EGD: - LA Grade B reflux esophagitis with no bleeding.                            Likely as result of recent vomiting. Biopsied.                           - Gastritis in the gastric fundus. Likely as result  of retching. Biopsied.                           - Gastroesophageal flap valve classified as Hill                            Grade IV (no fold, wide open lumen, hiatal hernia                            present).                           - Normal gastric body, incisura and antrum.                            Biopsied.                            - Normal examined duodenum. Biopsied.  Antimicrobials: Anti-infectives (From admission, onward)   None         Subjective: Patient seen in room after endoscopy, she is laying with her eyes closed, reports continued pain in her periumbilical area.  She has continued nausea.  Objective: Vitals:   07/23/20 1601 07/23/20 1610 07/23/20 1621 07/23/20 1653  BP: (!) 165/98 (!) 160/105 (!) 157/99 140/80  Pulse:    97  Resp: 16 18 (!) 26 16  Temp:    98.9 F  (37.2 C)  TempSrc:    Oral  SpO2:   100%   Weight:      Height:        Intake/Output Summary (Last 24 hours) at 07/23/2020 1820 Last data filed at 07/23/2020 1732 Gross per 24 hour  Intake 2391.74 ml  Output --  Net 2391.74 ml   Filed Weights   07/21/20 0237 07/23/20 1419  Weight: 81.2 kg 81.2 kg    Examination:  General exam: Alert, awake, oriented x 3 Respiratory system: Clear to auscultation. Respiratory effort normal. Cardiovascular system:RRR. No murmurs, rubs, gallops. Gastrointestinal system: Abdomen is nondistended, soft and tender in periumbilical area. No organomegaly or masses felt. Normal bowel sounds heard. Central nervous system: Alert and oriented. No focal neurological deficits. Extremities: No C/C/E, +pedal pulses Skin: No rashes, lesions or ulcers Psychiatry: Judgement and insight appear normal. Mood & affect appropriate.   Data Reviewed: I have personally reviewed following labs and imaging studies  CBC: Recent Labs  Lab 07/20/20 2007 07/22/20 0334 07/23/20 0336  WBC 16.1* 11.9* 12.5*  NEUTROABS  --  9.2* 8.5*  HGB 14.1 13.3 13.3  HCT 42.7 41.1 40.8  MCV 85.9 85.6 86.6  PLT 300 257 266    Basic Metabolic Panel: Recent Labs  Lab 07/20/20 2007 07/20/20 2139 07/21/20 0322 07/22/20 0334 07/23/20 0336  NA 135  --  135 132* 134*  K 2.7*  --  3.2* 3.1* 3.5  CL 97*  --  103 99 103  CO2 29  --  23 24 23   GLUCOSE 99  --  96 121* 119*  BUN 13  --  9 <5* <5*  CREATININE 0.97  --  0.72 0.76 0.72  CALCIUM 9.2  --  8.4* 9.1 9.4  MG  --  2.2  --  2.0 1.8  PHOS  --   --   --  2.9 2.6    GFR: Estimated Creatinine Clearance: 111.2 mL/min (by C-G formula based on SCr of 0.72 mg/dL).  Liver Function Tests: Recent Labs  Lab 07/20/20 2007 07/22/20 0334 07/23/20 0336  AST 14* 14* 12*  ALT 24 20 18   ALKPHOS 53 48 53  BILITOT 0.8 1.3* 0.9  PROT 8.0 7.5 7.2  ALBUMIN 4.6 4.3 4.1    CBG: No results for input(s): GLUCAP in the last 168  hours.   Recent Results (from the past 240 hour(s))  SARS CORONAVIRUS 2 (TAT 6-24 HRS) Nasopharyngeal Nasopharyngeal Swab     Status: None   Collection Time: 07/21/20 12:40 AM   Specimen: Nasopharyngeal Swab  Result Value Ref Range Status   SARS Coronavirus 2 NEGATIVE NEGATIVE Final    Comment: (NOTE) SARS-CoV-2 target nucleic acids are NOT DETECTED.  The SARS-CoV-2 RNA is generally detectable in upper and lower respiratory specimens during the acute phase of infection. Negative results do not preclude SARS-CoV-2 infection, do not rule out co-infections with other pathogens, and should not be used as the sole basis for treatment or other patient management decisions. Negative results must be combined with clinical observations, patient history, and epidemiological information. The expected result is Negative.  Fact Sheet for Patients: 09/20/20  Fact Sheet for Healthcare Providers: HairSlick.no  This test is not yet approved or cleared by the quierodirigir.com FDA and  has been authorized for detection and/or diagnosis of SARS-CoV-2 by FDA under an Emergency Use Authorization (EUA). This EUA will remain  in effect (meaning this test can be used) for the duration of the COVID-19 declaration under Se ction 564(b)(1) of the Act, 21 U.S.C. section 360bbb-3(b)(1), unless the authorization is terminated or revoked sooner.  Performed at [facility], [address] [place], [place]          Radiology Studies: No results found.      Scheduled Meds: . (feeding supplement) PROSource Plus  30 mL Oral BID BM  . amitriptyline  50 mg Oral QHS  . busPIRone  15 mg Oral QHS  . enoxaparin (LOVENOX) injection  40 mg Subcutaneous Q24H  . escitalopram  20 mg Oral QHS  . feeding supplement  1 Container Oral TID BM  . multivitamin with minerals  1 tablet Oral Daily  . pantoprazole (PROTONIX) IV  40 mg  Intravenous Q24H  . QUEtiapine  100 mg Oral QHS   Continuous Infusions: . dextrose 5 % and 0.45 % NaCl with KCl 40 mEq/L Stopped (07/23/20 1349)  . lactated ringers 125 mL/hr at 07/23/20 1421  . ondansetron [facility]) IV 8 mg (07/23/20 0606)  . promethazine (PHENERGAN) injection (IM or IVPB) Stopped (07/21/20 1630)     LOS: 1 day    Time spent: over 30 min    09/20/20, MD [facility]   To contact the attending provider between 7A-7P or the covering provider during after hours 7P-7A, please log into the web site [email or web address] and access using universal [place] password for that web site. If you do not have the password, please call the hospital operator.  07/23/2020, 6:20 PM

## 2020-07-23 NOTE — Anesthesia Postprocedure Evaluation (Signed)
Anesthesia Post Note  Patient: Diana Thomas  Procedure(s) Performed: ESOPHAGOGASTRODUODENOSCOPY (EGD) WITH PROPOFOL (N/A ) BIOPSY     Patient location during evaluation: Endoscopy Anesthesia Type: MAC Level of consciousness: awake and alert Pain management: pain level controlled Vital Signs Assessment: post-procedure vital signs reviewed and stable Respiratory status: spontaneous breathing, nonlabored ventilation and respiratory function stable Cardiovascular status: blood pressure returned to baseline and stable Postop Assessment: no apparent nausea or vomiting Anesthetic complications: no   No complications documented.  Last Vitals:  Vitals:   07/23/20 1610 07/23/20 1621  BP: (!) 160/105 (!) 157/99  Pulse:    Resp: 18 (!) 26  Temp:    SpO2:  100%    Last Pain:  Vitals:   07/23/20 1621  TempSrc:   PainSc: 0-No pain                 Merlinda Frederick

## 2020-07-23 NOTE — Op Note (Signed)
[facility] Patient Name: Diana SalkKierra Thomas Procedure Date: 07/23/2020 MRN: [id number] Attending MD: Beverley FiedlerJay M Daylan Boggess , MD Date of Birth: 03/29/1995 CSN: [id number] Age: [age] Admit Type: Inpatient Procedure:                Upper GI endoscopy Indications:              Persistent vomiting with nausea despite multiple                            antiemetics Providers:                Carie CaddyJay M. Rhea BeltonPyrtle, MD, Blenda MountsZharia Burton, RN, Brion AlimentShayla                            Proctor, Technician Referring MD:             [facility] Medicines:                Monitored Anesthesia Care Complications:            No immediate complications. Estimated Blood Loss:     Estimated blood loss was minimal. Procedure:                Pre-Anesthesia Assessment:                           - Prior to the procedure, a History and Physical                            was performed, and patient medications and                            allergies were reviewed. The patient's tolerance of                            previous anesthesia was also reviewed. The risks                            and benefits of the procedure and the sedation                            options and risks were discussed with the patient.                            All questions were answered, and informed consent                            was obtained. Prior Anticoagulants: The patient has                            taken no previous anticoagulant or antiplatelet                            agents. ASA Grade Assessment: II - A patient with  mild systemic disease. After reviewing the risks                            and benefits, the patient was deemed in                            satisfactory condition to undergo the procedure.                           After obtaining informed consent, the endoscope was                            passed under direct vision. Throughout the                            procedure, the  patient's blood pressure, pulse, and                            oxygen saturations were monitored continuously. The                            GIF-H190 ([id number]) Olympus gastroscope was                            introduced through the mouth, and advanced to the                            second part of duodenum. The upper GI endoscopy was                            accomplished without difficulty. The patient                            tolerated the procedure well. Scope In: Scope Out: Findings:      LA Grade B (one or more mucosal breaks greater than 5 mm, not extending       between the tops of two mucosal folds) esophagitis with no bleeding was       found in the lower third of the esophagus. Biopsies were taken with a       cold forceps for histology.      Localized mild inflammation characterized by congestion (edema) and       erythema was found in the gastric fundus. Biopsies were taken with a       cold forceps for histology.      The gastroesophageal flap valve was visualized endoscopically and       classified as Hill Grade IV (no fold, wide open lumen, hiatal hernia       present).      The gastric body, incisura and gastric antrum were normal. Biopsies were       taken with a cold forceps for histology and Helicobacter pylori testing.      The examined duodenum was normal. Biopsies were taken with a cold       forceps for histology. Impression:               - LA Grade  B reflux esophagitis with no bleeding.                            Likely as result of recent vomiting. Biopsied.                           - Gastritis in the gastric fundus. Likely as result                            of retching. Biopsied.                           - Gastroesophageal flap valve classified as Hill                            Grade IV (no fold, wide open lumen, hiatal hernia                            present).                           - Normal gastric body, incisura and antrum.                             Biopsied.                           - Normal examined duodenum. Biopsied. Moderate Sedation:      N/A Recommendation:           - Return patient to hospital ward for ongoing care.                           - Advance diet as tolerated.                           - Continue present medications. Amitriptyline has                            been started, though this will work better long                            term for prophylaxis (as opposed to abortive                            therapy). She reports having tried capsaicin cream                            without much benefit.                           - Await pathology results.                           - If ongoing vomiting despite medical management  the last test to consider is MRI brain to exclude                            central pathology.                           - Stop marijuana. Procedure Code(s):        --- Professional ---                           [phone], Esophagogastroduodenoscopy, flexible,                            transoral; with biopsy, single or multiple Diagnosis Code(s):        --- Professional ---                           K21.00, Gastro-esophageal reflux disease with                            esophagitis, without bleeding                           K29.70, Gastritis, unspecified, without bleeding                           K44.9, Diaphragmatic hernia without obstruction or                            gangrene                           R11.15, Cyclical vomiting syndrome unrelated to                            migraine CPT copyright 2019 American Medical Association. All rights reserved. The codes documented in this report are preliminary and upon coder review may  be revised to meet current compliance requirements. Beverley Fiedler, MD 07/23/2020 3:56:35 PM This report has been signed electronically. Number of Addenda: 0

## 2020-07-23 NOTE — Progress Notes (Addendum)
Progress Note  Chief Complaint:    Nausea and vomiting     ASSESSMENT / PLAN:    # 25 yo female with acute on chronic nausea / vomiting. Pror  evaluation has included RUQ x 2, CT scan , EGD. She smokes THC four times daily . Nausea / vomiting most likely related to cannibis hyperemesis vr cyclic vomiting.  --Vomited PO meds last night. She says Haldol and Ativan seem to be the most effective. Though she had only one dose of Haldol yesterday morning she did have % doses of IV Ativan yesterday and still vomited evening medications. She hasn't taken anything PO.  She has both Haldol and Ativan this am and feels like she want to drink something. Will see how she does with PO.  --She doesn't feel like Buspar helps GI symptoms but I am reluctant to stop it right now out of concern that it may cause some rebound anxiety.  --Despite treatment her nausea / vomiting have been present for days. At some point we may need to exclude other etiologies of persistent symptoms.  --Appreciate Psychiatry's help. Will start Elavil 25 gm Q HS. This will likely need upward titration in a few weeks. I made her a follow up with Dr. Adela Lank in July.    ADDENDUM:  After I saw patient she consumed a small amount of apple juice with almost immediate vomiting. Will proceed with EGD, hopefully this afternoon but if not then tomorrow.    # Chronic, intermittent constipation. She hasn't had a BM in a week but then has had minimal PO during that time. Since constipation known to exacerbate her N/V  --Glycerin supp prn   # GERD, manages at home with Pepcid. Currently not on anything for GERD in hospital. With vomiting she is at risk for esophagitis.  --Will give IV protonix Q 24 hours.   # ? IBS. Bowel habits are mildly irregular and she has abdominal pain relieved with defecation.  --She has never tried fiber on a consistent basis. Upon discharge when acute issues have resolve she can try daily Benefiber.  #  Chronic leukocytosis. ED labs dating back to 2019 have shown WBC count elevated up to 18K at times. Most if not all these times patient was in ED for nausea / vomiting. Reactive ? WBD this admission up to 15, down to 12.5 today.      SUBJECTIVE:   Vomited medications last night. Hasn't had anything PO this am. Feels okay right now. Had both Ativan and Haldol earlier.    OBJECTIVE:    Scheduled inpatient medications:  . busPIRone  15 mg Oral QHS  . enoxaparin (LOVENOX) injection  40 mg Subcutaneous Q24H  . escitalopram  20 mg Oral QHS  . feeding supplement  1 Container Oral TID BM  . pantoprazole (PROTONIX) IV  40 mg Intravenous Q24H  . QUEtiapine  100 mg Oral QHS   Continuous inpatient infusions:  . dextrose 5 % and 0.45 % NaCl with KCl 40 mEq/L 100 mL/hr at 07/23/20 0604  . ondansetron [facility]) IV 8 mg (07/23/20 0606)  . promethazine (PHENERGAN) injection (IM or IVPB) Stopped (07/21/20 1630)   PRN inpatient medications: haloperidol lactate, LORazepam, morphine injection, promethazine **OR** promethazine (PHENERGAN) injection (IM or IVPB) **OR** promethazine  Vital signs in last 24 hours: Temp:  [98.4 F (36.9 C)] 98.4 F (36.9 C) (06/07 2148) Pulse Rate:  [87] 87 (06/07 2148) Resp:  [16-18] 16 (06/07 2148) BP: (135-151)/(92-94) 151/94 (06/07  2148) SpO2:  [100 %] 100 % (06/07 2148) Last BM Date: 07/20/20  Intake/Output Summary (Last 24 hours) at 07/23/2020 1007 Last data filed at 07/23/2020 0604 Gross per 24 hour  Intake 1426.5 ml  Output --  Net 1426.5 ml     Physical Exam:  . General: Alert female in NAD . Heart:  Regular rate and rhythm. No lower extremity edema . Pulmonary: Normal respiratory effort . Abdomen: Soft, nondistended, nontender. Normal bowel sounds.  . Neurologic: Alert and oriented . Psych: Pleasant. Cooperative.   Filed Weights   07/21/20 0237  Weight: 81.2 kg    Intake/Output from previous day: 06/07 0701 - 06/08 0700 In: 1426.5 [P.O.:360;  I.V.:1016.5; IV Piggyback:50] Out: -  Intake/Output this shift: No intake/output data recorded.    Lab Results: Recent Labs    07/20/20 2007 07/22/20 0334 07/23/20 0336  WBC 16.1* 11.9* 12.5*  HGB 14.1 13.3 13.3  HCT 42.7 41.1 40.8  PLT 300 257 266   BMET Recent Labs    07/21/20 0322 07/22/20 0334 07/23/20 0336  NA 135 132* 134*  K 3.2* 3.1* 3.5  CL 103 99 103  CO2 23 24 23   GLUCOSE 96 121* 119*  BUN 9 <5* <5*  CREATININE 0.72 0.76 0.72  CALCIUM 8.4* 9.1 9.4   LFT Recent Labs    07/23/20 0336  PROT 7.2  ALBUMIN 4.1  AST 12*  ALT 18  ALKPHOS 53  BILITOT 0.9   PT/INR No results for input(s): LABPROT, INR in the last 72 hours. Hepatitis Panel No results for input(s): HEPBSAG, HCVAB, HEPAIGM, HEPBIGM in the last 72 hours.  No results found.  Principal Problem:   Cyclical vomiting Active Problems:   Generalized anxiety disorder   Hypokalemia   Intractable vomiting   Nausea & vomiting     LOS: 1 day   09/22/20 ,NP 07/23/2020, 10:07 AM

## 2020-07-23 NOTE — Transfer of Care (Signed)
Immediate Anesthesia Transfer of Care Note  Patient: Diana Thomas  Procedure(s) Performed: ESOPHAGOGASTRODUODENOSCOPY (EGD) WITH PROPOFOL (N/A ) BIOPSY  Patient Location: PACU and Endoscopy Unit  Anesthesia Type:MAC  Level of Consciousness: awake, drowsy and responds to stimulation  Airway & Oxygen Therapy: Patient Spontanous Breathing and Patient connected to face mask oxygen  Post-op Assessment: Report given to RN and Post -op Vital signs reviewed and stable  Post vital signs: Reviewed and stable  Last Vitals:  Vitals Value Taken Time  BP    Temp    Pulse 69 07/23/20 1547  Resp 17 07/23/20 1547  SpO2 100 % 07/23/20 1547  Vitals shown include unvalidated device data.  Last Pain:  Vitals:   07/23/20 1419  TempSrc: Oral  PainSc: 3          Complications: No complications documented.

## 2020-07-23 NOTE — Progress Notes (Signed)
No anesthesia time left for today to proceed with EGD. Will put on schedule for tomorrow.

## 2020-07-23 NOTE — Anesthesia Preprocedure Evaluation (Addendum)
Anesthesia Evaluation  Patient identified by MRN, date of birth, ID band Patient awake    Reviewed: Allergy & Precautions, NPO status , Patient's Chart, lab work & pertinent test results  Airway Mallampati: II  TM Distance: >3 FB Neck ROM: Full    Dental no notable dental hx.    Pulmonary neg pulmonary ROS, Patient abstained from smoking.,    Pulmonary exam normal breath sounds clear to auscultation       Cardiovascular negative cardio ROS Normal cardiovascular exam Rhythm:Regular Rate:Normal     Neuro/Psych PSYCHIATRIC DISORDERS Anxiety Depression negative neurological ROS     GI/Hepatic GERD  ,(+)     substance abuse  marijuana use, +vomiting only after po intake   Endo/Other  negative endocrine ROS  Renal/GU negative Renal ROS  negative genitourinary   Musculoskeletal negative musculoskeletal ROS (+)   Abdominal   Peds negative pediatric ROS (+)  Hematology negative hematology ROS (+)   Anesthesia Other Findings   Reproductive/Obstetrics negative OB ROS                            Anesthesia Physical Anesthesia Plan  ASA: II and emergent  Anesthesia Plan: MAC   Post-op Pain Management:    Induction: Intravenous  PONV Risk Score and Plan: 2 and Propofol infusion, TIVA and Treatment may vary due to age or medical condition  Airway Management Planned: Natural Airway and Nasal Cannula  Additional Equipment:   Intra-op Plan:   Post-operative Plan:   Informed Consent: I have reviewed the patients History and Physical, chart, labs and discussed the procedure including the risks, benefits and alternatives for the proposed anesthesia with the patient or authorized representative who has indicated his/her understanding and acceptance.       Plan Discussed with: CRNA and Anesthesiologist  Anesthesia Plan Comments:        Anesthesia Quick Evaluation

## 2020-07-23 NOTE — Anesthesia Procedure Notes (Signed)
Procedure Name: MAC Date/Time: 07/23/2020 3:29 PM Performed by: Lollie Sails, CRNA Pre-anesthesia Checklist: Patient identified, Emergency Drugs available, Suction available, Patient being monitored and Timeout performed Oxygen Delivery Method: Simple face mask Preoxygenation: POM used.

## 2020-07-23 NOTE — Progress Notes (Signed)
Initial Nutrition Assessment  DOCUMENTATION CODES:   Not applicable  INTERVENTION:  - continue Boost Breeze TID, each supplement provides 250 kcal and 9 grams of protein. - will order 30 ml Prosource Plus BID, each supplement provides 100 kcal and 15 grams protein.  - will order 1 tablet multivitamin with minerals/day. - diet advancement as medically feasible.  - complete NFPE at follow-up.    NUTRITION DIAGNOSIS:   Inadequate oral intake related to acute illness,nausea,vomiting as evidenced by per patient/family report.  GOAL:   Patient will meet greater than or equal to 90% of their needs  MONITOR:   PO intake,Supplement acceptance,Diet advancement,Labs,Weight trends,I & O's  REASON FOR ASSESSMENT:   Malnutrition Screening Tool  ASSESSMENT:   25 y.o. female with medical history of cyclic vomiting since 2017, daily cannabis use, anxiety, and GERD. She presented to the ED d/t persistent N/V with associated epigastric pain which began a week prior to presentation.  Diet advanced from NPO and CLD on 6/6 at 0033. She consumed 0% of all meals on 6/6 and no other meals documented since that time.   Boost Breeze ordered TID on 6/6 and she has accepted this supplement 100% of the time offered.   Patient noted to be out of the room to Endoscopy.   Weight today is 179 lb and weight on 04/28/20 was also 179 lb; weight has been stable throughout the past 3 months. Prior to that, the most recently documented weight was on 02/09/20 when she weighed 162 lb.   Per notes: - cyclic vomiting with daily EKG and ongoing discussions about cessation   Labs reviewed; Na: 134 mmol/l, BUN: <5 mg/dl.  Medications reviewed; 40 mg IV protonix/day, 10 mEq IV KCl x4 runs 6/7. IVF; D5-1/2 NS-40 mEq IV KCl @ 100 ml/hr (408 kcal/24 hrs).     NUTRITION - FOCUSED PHYSICAL EXAM:  unable to complete at this time.  Diet Order:   Diet Order            Diet NPO time specified Except for: Sips with  Meds  Diet effective 0500 tomorrow           Diet clear liquid Room service appropriate? Yes; Fluid consistency: Thin  Diet effective now                 EDUCATION NEEDS:   Not appropriate for education at this time  Skin:  Skin Assessment: Reviewed RN Assessment  Last BM:  PTA  Height:   Ht Readings from Last 1 Encounters:  07/23/20 5\' 5"  (1.651 m)    Weight:   Wt Readings from Last 1 Encounters:  07/23/20 81.2 kg    Estimated Nutritional Needs:  Kcal:  1700-1900 kcal Protein:  80-90 grams Fluid:  >/= 2.3 L/day      09/22/20, MS, RD, LDN, CNSC Inpatient Clinical Dietitian RD pager # available in AMION  After hours/weekend pager # available in [facility]

## 2020-07-24 DIAGNOSIS — E876 Hypokalemia: Secondary | ICD-10-CM

## 2020-07-24 DIAGNOSIS — F411 Generalized anxiety disorder: Secondary | ICD-10-CM

## 2020-07-24 DIAGNOSIS — K2900 Acute gastritis without bleeding: Secondary | ICD-10-CM

## 2020-07-24 DIAGNOSIS — K209 Esophagitis, unspecified without bleeding: Secondary | ICD-10-CM

## 2020-07-24 LAB — CBC
HCT: 39.9 % (ref 36.0–46.0)
Hemoglobin: 13 g/dL (ref 12.0–15.0)
MCH: 28.4 pg (ref 26.0–34.0)
MCHC: 32.6 g/dL (ref 30.0–36.0)
MCV: 87.3 fL (ref 80.0–100.0)
Platelets: 246 10*3/uL (ref 150–400)
RBC: 4.57 MIL/uL (ref 3.87–5.11)
RDW: 12.9 % (ref 11.5–15.5)
WBC: 10.9 10*3/uL — ABNORMAL HIGH (ref 4.0–10.5)
nRBC: 0 % (ref 0.0–0.2)

## 2020-07-24 LAB — BASIC METABOLIC PANEL
Anion gap: 8 (ref 5–15)
BUN: 5 mg/dL — ABNORMAL LOW (ref 6–20)
CO2: 23 mmol/L (ref 22–32)
Calcium: 9 mg/dL (ref 8.9–10.3)
Chloride: 101 mmol/L (ref 98–111)
Creatinine, Ser: 0.8 mg/dL (ref 0.44–1.00)
GFR, Estimated: >60 mL/min (ref >60)
Glucose, Bld: 95 mg/dL (ref 70–99)
Potassium: 3.2 mmol/L — ABNORMAL LOW (ref 3.5–5.1)
Sodium: 132 mmol/L — ABNORMAL LOW (ref 135–145)

## 2020-07-24 MED ORDER — PANTOPRAZOLE SODIUM 40 MG PO TBEC: 40.0000 mg | DELAYED_RELEASE_TABLET | Freq: Every day | ORAL | 1 refills | Status: DC

## 2020-07-24 MED ORDER — AMITRIPTYLINE HCL 50 MG PO TABS
50.0000 mg | ORAL_TABLET | Freq: Every day | ORAL | 1 refills | Status: DC
Start: 2020-07-24 — End: 2020-10-29

## 2020-07-24 MED ORDER — SUMATRIPTAN SUCCINATE 6 MG/0.5ML ~~LOC~~ SOLN
6.0000 mg | Freq: Once | SUBCUTANEOUS | Status: AC
  Administered 2020-07-24: 6 mg via SUBCUTANEOUS
  Filled 2020-07-24: qty 0.5

## 2020-07-24 MED ORDER — SUMATRIPTAN 20 MG/ACT NA SOLN: 20.0000 mg | NASAL | 0 refills | Status: DC | PRN

## 2020-07-24 MED ORDER — ONDANSETRON 8 MG PO TBDP: 8.0000 mg | ORAL_TABLET | Freq: Three times a day (TID) | ORAL | 0 refills | Status: DC | PRN

## 2020-07-24 MED ORDER — POTASSIUM CHLORIDE CRYS ER 20 MEQ PO TBCR
40.0000 meq | EXTENDED_RELEASE_TABLET | Freq: Once | ORAL | Status: AC
  Administered 2020-07-24: 40 meq via ORAL
  Filled 2020-07-24: qty 2

## 2020-07-24 NOTE — Plan of Care (Signed)
  Problem: Pain Managment: Goal: General experience of comfort will improve Outcome: Progressing   Problem: Nutrition: Goal: Adequate nutrition will be maintained Outcome: Progressing   

## 2020-07-24 NOTE — Progress Notes (Signed)
Discharge package printed and instructions given to patient. Verbalizes understanding. No question. 

## 2020-07-24 NOTE — Discharge Summary (Signed)
Physician Discharge Summary  Diana Thomas UKG:[id number] DOB: 07/03/1995 DOA: 07/20/2020  PCP: Janeece Agee, NP  Admit date: 07/20/2020 Discharge date: 07/24/2020  Admitted From: home Disposition:  home  Recommendations for Outpatient Follow-up:  Follow up with PCP in 1-2 weeks Please obtain BMP/CBC in one week Follow up scheduled with GI, Dr. Adela Lank on 7/14 Follow up with primary care physician in 1-2 weeks Will need serial EKGs to monitor QTC on amitriptyline  She will be referred to outpatient psychiatry  Discharge Condition:stable CODE STATUS:full code Diet recommendation: full liquids, advance as tolerated  Brief/Interim Summary: 25 y/o female with history of cyclical vomiting, cannabis use, anxiety, GERD, presented with persistent nausea, vomiting and abdominal pain. Symptoms started approximately 1 week prior to admission. No diarrhea. She was seen by GI. Treated with antiemetics, benzodiazepines. She underwent EGD that did show esophagitis and gastritis, that was felt to be related to recurrent vomiting. She was started on amitriptyline for prophylactic therapy, but did seem to respond to sumatriptan as abortive therapy. She did receive 2 dose of sumatriptan while in the hospital. Currently abdominal pain has resolved and she not required any further pain medications. Nausea and vomiting has improved and she is tolerating full liquids. She will plan on following up with GI as an outpatient. She was strongly advised to abstain from any further cannabis use, since this can also trigger her symptoms.  Discharge Diagnoses:  Principal Problem:   Cyclical vomiting Active Problems:   Generalized anxiety disorder   Hypokalemia   Intractable vomiting   Nausea & vomiting   Acute esophagitis   Acute gastritis without hemorrhage  Cyclic vomiting with intractable nasusea/vomiting - suspect this is 2/2 MJ use (notes desire for hot showers with discomfort) - she received scheduled  antiemetics - received ativan sparingly for nausea/vomiting unresponsive to zofran/phenergan  - QTC was monitored and noted to be normal - strongly enourage cessation of cannabis - she notes willingness to do this - appreciate GI recs -  PPI - encouraged stopping MJ -started on amitriptyline for prophylactic therapy - She received 2 dose of sumatriptan as abortive therapy and seemed to respond to this treatment -will prescribe intranasal sumatriptan to be used as needed at home if symptoms recur -she is tolerating full liquids and wishes to return home and advance diet at home  Esophagitis/Gastritis -noted on EGD -felt to be related to reflux from recurrent vomiting -continue on PPI   Hypokalemia -replaced   Anxiety/depression - Lexapro and Seroquel - buspar, started on amitriptyline per GI -will need follow up with PCP to follow QTc  Discharge Instructions  Discharge Instructions     Diet - low sodium heart healthy   Complete by: As directed    Increase activity slowly   Complete by: As directed       Allergies as of 07/24/2020   No Known Allergies      Medication List     STOP taking these medications    LORazepam 1 MG tablet Commonly known as: Ativan   potassium chloride SA 20 MEQ tablet Commonly known as: KLOR-CON       TAKE these medications    ALPRAZolam 0.5 MG dissolvable tablet Commonly known as: NIRAVAM Take 1 tablet (0.5 mg total) by mouth 2 (two) times daily as needed for anxiety.   amitriptyline 50 MG tablet Commonly known as: ELAVIL Take 1 tablet (50 mg total) by mouth at bedtime.   busPIRone 15 MG tablet Commonly known as: BUSPAR TAKE 1  TABLET BY MOUTH AT BEDTIME   capsaicin 0.025 % cream Commonly known as: ZOSTRIX Apply topically 2 (two) times daily.   escitalopram 20 MG tablet Commonly known as: LEXAPRO TAKE 1 TABLET BY MOUTH AT BEDTIME What changed: Another medication with the same name was removed. Continue taking this  medication, and follow the directions you see here.   famotidine 10 MG tablet Commonly known as: PEPCID Take 1 tablet (10 mg total) by mouth 2 (two) times daily.   medroxyPROGESTERone 150 MG/ML injection Commonly known as: DEPO-PROVERA Inject 150 mg into the muscle every 3 (three) months.   ondansetron 8 MG disintegrating tablet Commonly known as: Zofran ODT Take 1 tablet (8 mg total) by mouth every 8 (eight) hours as needed for nausea or vomiting.   pantoprazole 40 MG tablet Commonly known as: Protonix Take 1 tablet (40 mg total) by mouth daily.   promethazine 12.5 MG tablet Commonly known as: PHENERGAN Take 1 tablet (12.5 mg total) by mouth every 8 (eight) hours as needed for nausea or vomiting.   QUEtiapine 100 MG tablet Commonly known as: SEROQUEL TAKE 1 TABLET BY MOUTH AT BEDTIME   SUMAtriptan 20 MG/ACT nasal spray Commonly known as: IMITREX Place 1 spray (20 mg total) into the nose every 2 (two) hours as needed for migraine or headache. May repeat in 2 hours if abdominal pain/vomiting persists or recurs. Do not take more than 2 doses in a day and 6 doses in a week        Follow-up Information     Armbruster, Willaim RayasSteven P, MD Follow up on 08/28/2020.   Specialty: Gastroenterology Why: at 8:30. Please arrive 10 minutes early.  Contact information: [address] [place] [address] [phone]                No Known Allergies  Consultations: Gastroenterology Psychiatry   Procedures/Studies: DG ABD ACUTE 2+V W 1V CHEST  Result Date: 07/14/2020 CLINICAL DATA:  Abdominal pain, nausea/vomiting EXAM: DG ABDOMEN ACUTE WITH 1 VIEW CHEST COMPARISON:  None. FINDINGS: Lungs are clear.  No pleural effusion or pneumothorax. The heart is normal in size. Nonobstructive bowel gas pattern. No evidence of free air under the diaphragm on the upright view. Visualized osseous structures are within normal limits. IMPRESSION: No evidence of acute cardiopulmonary  disease. No evidence of small bowel obstruction or free air. Electronically Signed   By: Charline BillsSriyesh  Krishnan M.D.   On: 07/14/2020 10:57      Subjective: Feeling better today. Abdominal pain is better and she has not required any morphine. She did get one dose of zofran today, but vomiting is better and she is tolerating liquids  Discharge Exam: Vitals:   07/23/20 1653 07/23/20 2107 07/24/20 0452 07/24/20 1352  BP: 140/80 126/87 130/79 (!) 133/94  Pulse: 97 (!) 56 74 72  Resp: 16 16 16    Temp: 98.9 F (37.2 C) 98.2 F (36.8 C) 99 F (37.2 C) 98.7 F (37.1 C)  TempSrc: Oral Oral Oral   SpO2:  99% 99% 100%  Weight:      Height:        General: Pt is alert, awake, not in acute distress Cardiovascular: RRR, S1/S2 +, no rubs, no gallops Respiratory: CTA bilaterally, no wheezing, no rhonchi Abdominal: Soft, NT, ND, bowel sounds + Extremities: no edema, no cyanosis    The results of significant diagnostics from this hospitalization (including imaging, microbiology, ancillary and laboratory) are listed below for reference.  Microbiology: Recent Results (from the past 240 hour(s))  SARS CORONAVIRUS 2 (TAT 6-24 HRS) Nasopharyngeal Nasopharyngeal Swab     Status: None   Collection Time: 07/21/20 12:40 AM   Specimen: Nasopharyngeal Swab  Result Value Ref Range Status   SARS Coronavirus 2 NEGATIVE NEGATIVE Final    Comment: (NOTE) SARS-CoV-2 target nucleic acids are NOT DETECTED.  The SARS-CoV-2 RNA is generally detectable in upper and lower respiratory specimens during the acute phase of infection. Negative results do not preclude SARS-CoV-2 infection, do not rule out co-infections with other pathogens, and should not be used as the sole basis for treatment or other patient management decisions. Negative results must be combined with clinical observations, patient history, and epidemiological information. The expected result is Negative.  Fact Sheet for  Patients: HairSlick.no  Fact Sheet for Healthcare Providers: quierodirigir.com  This test is not yet approved or cleared by the [place] FDA and  has been authorized for detection and/or diagnosis of SARS-CoV-2 by FDA under an Emergency Use Authorization (EUA). This EUA will remain  in effect (meaning this test can be used) for the duration of the COVID-19 declaration under Se ction 564(b)(1) of the Act, 21 U.S.C. section 360bbb-3(b)(1), unless the authorization is terminated or revoked sooner.  Performed at [facility], [address], [place], [place] [address]      Labs: BNP (last 3 results) No results for input(s): BNP in the last 8760 hours. Basic Metabolic Panel: Recent Labs  Lab 07/20/20 2007 07/20/20 2139 07/21/20 0322 07/22/20 0334 07/23/20 0336 07/24/20 0336  NA 135  --  135 132* 134* 132*  K 2.7*  --  3.2* 3.1* 3.5 3.2*  CL 97*  --  103 99 103 101  CO2 29  --  23 24 23 23   GLUCOSE 99  --  96 121* 119* 95  BUN 13  --  9 <5* <5* 5*  CREATININE 0.97  --  0.72 0.76 0.72 0.80  CALCIUM 9.2  --  8.4* 9.1 9.4 9.0  MG  --  2.2  --  2.0 1.8  --   PHOS  --   --   --  2.9 2.6  --    Liver Function Tests: Recent Labs  Lab 07/20/20 2007 07/22/20 0334 07/23/20 0336  AST 14* 14* 12*  ALT 24 20 18   ALKPHOS 53 48 53  BILITOT 0.8 1.3* 0.9  PROT 8.0 7.5 7.2  ALBUMIN 4.6 4.3 4.1   Recent Labs  Lab 07/20/20 2008  LIPASE 37   No results for input(s): AMMONIA in the last 168 hours. CBC: Recent Labs  Lab 07/20/20 2007 07/22/20 0334 07/23/20 0336 07/24/20 0336  WBC 16.1* 11.9* 12.5* 10.9*  NEUTROABS  --  9.2* 8.5*  --   HGB 14.1 13.3 13.3 13.0  HCT 42.7 41.1 40.8 39.9  MCV 85.9 85.6 86.6 87.3  PLT 300 257 266 246   Cardiac Enzymes: No results for input(s): CKTOTAL, CKMB, CKMBINDEX, TROPONINI in the last 168 hours. BNP: Invalid input(s): POCBNP CBG: No results for input(s): GLUCAP in the  last 168 hours. D-Dimer No results for input(s): DDIMER in the last 72 hours. Hgb A1c No results for input(s): HGBA1C in the last 72 hours. Lipid Profile No results for input(s): CHOL, HDL, LDLCALC, TRIG, CHOLHDL, LDLDIRECT in the last 72 hours. Thyroid function studies No results for input(s): TSH, T4TOTAL, T3FREE, THYROIDAB in the last 72 hours.  Invalid input(s): FREET3 Anemia work up No results for input(s):  VITAMINB12, FOLATE, FERRITIN, TIBC, IRON, RETICCTPCT in the last 72 hours. Urinalysis    Component Value Date/Time   COLORURINE YELLOW 07/20/2020 2007   APPEARANCEUR HAZY (A) 07/20/2020 2007   LABSPEC 1.025 07/20/2020 2007   PHURINE 6.0 07/20/2020 2007   GLUCOSEU NEGATIVE 07/20/2020 2007   HGBUR NEGATIVE 07/20/2020 2007   BILIRUBINUR NEGATIVE 07/20/2020 2007   BILIRUBINUR negative 02/21/2018 0934   KETONESUR 80 (A) 07/20/2020 2007   PROTEINUR NEGATIVE 07/20/2020 2007   UROBILINOGEN 0.2 02/21/2018 0934   NITRITE NEGATIVE 07/20/2020 2007   LEUKOCYTESUR NEGATIVE 07/20/2020 2007   Sepsis Labs Invalid input(s): PROCALCITONIN,  WBC,  LACTICIDVEN Microbiology Recent Results (from the past 240 hour(s))  SARS CORONAVIRUS 2 (TAT 6-24 HRS) Nasopharyngeal Nasopharyngeal Swab     Status: None   Collection Time: 07/21/20 12:40 AM   Specimen: Nasopharyngeal Swab  Result Value Ref Range Status   SARS Coronavirus 2 NEGATIVE NEGATIVE Final    Comment: (NOTE) SARS-CoV-2 target nucleic acids are NOT DETECTED.  The SARS-CoV-2 RNA is generally detectable in upper and lower respiratory specimens during the acute phase of infection. Negative results do not preclude SARS-CoV-2 infection, do not rule out co-infections with other pathogens, and should not be used as the sole basis for treatment or other patient management decisions. Negative results must be combined with clinical observations, patient history, and epidemiological information. The expected result is Negative.  Fact  Sheet for Patients: HairSlick.no  Fact Sheet for Healthcare Providers: quierodirigir.com  This test is not yet approved or cleared by the [place] FDA and  has been authorized for detection and/or diagnosis of SARS-CoV-2 by FDA under an Emergency Use Authorization (EUA). This EUA will remain  in effect (meaning this test can be used) for the duration of the COVID-19 declaration under Se ction 564(b)(1) of the Act, 21 U.S.C. section 360bbb-3(b)(1), unless the authorization is terminated or revoked sooner.  Performed at [facility], [address], [place], [place] [address]      Time coordinating discharge:  SIGNED:   Erick Blinks, MD  [facility] 07/24/2020, 4:09 PM   If 7PM-7AM, please contact night-coverage [email or web address]

## 2020-07-24 NOTE — Progress Notes (Addendum)
Progress Note  Chief Complaint:    nausea, vomiting     ASSESSMENT / PLAN:     # 25 yo female with acute on chronic intermittent nausea / vomiting. Cannabis hyperemesis vr cyclic vomiting syndrome. Prior evaluation has included RUQ x 2, CT scan , EGD in 2020 and another this admission. EGD yesterday showed reflux esophagitis ( not surprising with all the vomiting ) and gastritis.  --She received a dose of Sumatriptan yesterday and today ( used as abortive therapy  --She feels about 80 % better today. Tolerated juice. Looking forward to her Mother bringing chicken noodle soup --Started on 50 mg of Elavil yesterday ( cyclic vomiting prophylaxis). This can be titrated upward in a few weeks.  --She doesn't feel like Buspar helps GI symptoms but I am reluctant to stop it right now out of concern that it may cause some rebound anxiety.  --She smokes THC four times daily but wants to quit.   --Made her a follow up with Dr. Adela Lank in July.     # Chronic, intermittent constipation. She hasn't had a BM in a week but then has had minimal PO during that time. Since constipation known to exacerbate her N/V  --Glycerin supp prn     # GERD, manages at home with Pepcid. Currently not on anything for GERD in hospital. With vomiting she is at risk for esophagitis. --Will give IV protonix Q 24 hours.   # ? IBS. Bowel habits are mildly irregular and she has abdominal pain relieved with defecation.   --She has never tried fiber on a consistent basis. Upon discharge when acute issues have resolve she can try daily Benefiber.    # Chronic leukocytosis. ED labs dating back to 2019 have shown WBC count elevated up to 18K at times. Most if not all these times patient was in ED for nausea / vomiting. Reactive ? WBC this admission up to 15, down to 10.9 today.      Addendum: I have taken an interval history, reviewed the chart, and examined the patient. I agree with the Advanced Practitioner's note and  impression. --Improved with triptan for abortive therapy --Continue supportive care --esophageal, stomach and duodenal bx pending, but I do not expect this to change management --Amitriptyline is prophylactic and can be titrated up as needed --Needs to stop THC and she seems motivated to do so --GI will sign off, but remain available if needed      SUBJECTIVE:   Feels about 80% better today. Tolerated juice. Wants chicken noodle soup - her mother is bringing some    OBJECTIVE:    Scheduled inpatient medications:   (feeding supplement) PROSource Plus  30 mL Oral BID BM   amitriptyline  50 mg Oral QHS   busPIRone  15 mg Oral QHS   enoxaparin (LOVENOX) injection  40 mg Subcutaneous Q24H   escitalopram  20 mg Oral QHS   feeding supplement  1 Container Oral TID BM   multivitamin with minerals  1 tablet Oral Daily   pantoprazole (PROTONIX) IV  40 mg Intravenous Q24H   potassium chloride  40 mEq Oral Once   QUEtiapine  100 mg Oral QHS   SUMAtriptan  6 mg Subcutaneous Once   Continuous inpatient infusions:   ondansetron (ZOFRAN) IV 8 mg (07/24/20 0557)   promethazine (PHENERGAN) injection (IM or IVPB) Stopped (07/21/20 1630)   PRN inpatient medications: Glycerin (Adult), haloperidol lactate, LORazepam, morphine injection, promethazine **OR** promethazine (PHENERGAN) injection (IM  or IVPB) **OR** promethazine  Vital signs in last 24 hours: Temp:  [98.1 F (36.7 C)-99.4 F (37.4 C)] 99 F (37.2 C) (06/09 0452) Pulse Rate:  [56-97] 74 (06/09 0452) Resp:  [11-26] 16 (06/09 0452) BP: (112-185)/(78-140) 130/79 (06/09 0452) SpO2:  [99 %-100 %] 99 % (06/09 0452) Weight:  [81.2 kg] 81.2 kg (06/08 1419) Last BM Date: 07/20/20  Intake/Output Summary (Last 24 hours) at 07/24/2020 1031 Last data filed at 07/23/2020 1732 Gross per 24 hour  Intake 965.24 ml  Output --  Net 965.24 ml     Physical Exam:  General: Alert female in NAD Heart:  Regular rate and rhythm. No lower extremity  edema Pulmonary: Normal respiratory effort Abdomen: Soft, nondistended, nontender. Normal bowel sounds.  Neurologic: Alert and oriented Psych: Pleasant. Cooperative.   Filed Weights   07/21/20 0237 07/23/20 1419  Weight: 81.2 kg 81.2 kg    Intake/Output from previous day: 06/08 0701 - 06/09 0700 In: 965.2 [I.V.:965.2] Out: -  Intake/Output this shift: No intake/output data recorded.    Lab Results: Recent Labs    07/22/20 0334 07/23/20 0336 07/24/20 0336  WBC 11.9* 12.5* 10.9*  HGB 13.3 13.3 13.0  HCT 41.1 40.8 39.9  PLT 257 266 246   BMET Recent Labs    07/22/20 0334 07/23/20 0336 07/24/20 0336  NA 132* 134* 132*  K 3.1* 3.5 3.2*  CL 99 103 101  CO2 24 23 23   GLUCOSE 121* 119* 95  BUN <5* <5* 5*  CREATININE 0.76 0.72 0.80  CALCIUM 9.1 9.4 9.0   LFT Recent Labs    07/23/20 0336  PROT 7.2  ALBUMIN 4.1  AST 12*  ALT 18  ALKPHOS 53  BILITOT 0.9   PT/INR No results for input(s): LABPROT, INR in the last 72 hours. Hepatitis Panel No results for input(s): HEPBSAG, HCVAB, HEPAIGM, HEPBIGM in the last 72 hours.  No results found.      Principal Problem:   Cyclical vomiting Active Problems:   Generalized anxiety disorder   Hypokalemia   Intractable vomiting   Nausea & vomiting   Acute esophagitis   Acute gastritis without hemorrhage     LOS: 2 days   09/22/20 ,NP 07/24/2020, 10:31 AM

## 2020-07-24 NOTE — TOC Transition Note (Signed)
Transition of Care [facility]) - CM/SW Discharge Note  Patient Details  Name: Diana Thomas MRN: [id number] Date of Birth: 09-19-1995  [facility] CM/SW Contact:  Ewing Schlein, LCSW Phone Number: 07/24/2020, 4:07 PM  Clinical Narrative: Patient provided outpatient BH resource to patient. TOC signing off.  Final next level of care: Home/Self Care Barriers to Discharge: Barriers Resolved  Discharge Plan and Services      DME Arranged: N/A DME Agency: NA  Readmission Risk Interventions No flowsheet data found.

## 2020-07-25 ENCOUNTER — Encounter (HOSPITAL_COMMUNITY): Payer: Self-pay | Admitting: Internal Medicine

## 2020-07-25 LAB — SURGICAL PATHOLOGY

## 2020-07-26 ENCOUNTER — Encounter: Payer: Self-pay | Admitting: Internal Medicine

## 2020-07-31 ENCOUNTER — Encounter: Payer: Self-pay | Admitting: Registered Nurse

## 2020-08-13 ENCOUNTER — Encounter: Payer: Self-pay | Admitting: Registered Nurse

## 2020-08-27 ENCOUNTER — Other Ambulatory Visit: Payer: Self-pay | Admitting: Registered Nurse

## 2020-08-27 DIAGNOSIS — Z793 Long term (current) use of hormonal contraceptives: Secondary | ICD-10-CM

## 2020-08-27 MED ORDER — MEDROXYPROGESTERONE ACETATE 150 MG/ML IM SUSP: 150.0000 mg | INTRAMUSCULAR | 2 refills | Status: DC

## 2020-08-27 NOTE — Telephone Encounter (Signed)
Sent thanks rich

## 2020-08-28 ENCOUNTER — Ambulatory Visit: Payer: Self-pay | Admitting: Gastroenterology

## 2020-09-26 ENCOUNTER — Other Ambulatory Visit: Payer: Self-pay | Admitting: Registered Nurse

## 2020-09-26 ENCOUNTER — Other Ambulatory Visit (HOSPITAL_COMMUNITY): Payer: Self-pay

## 2020-09-26 MED ORDER — ESCITALOPRAM OXALATE 20 MG PO TABS
ORAL_TABLET | Freq: Every day | ORAL | 1 refills | Status: DC
  Filled 2020-09-26: qty 90, 90d supply, fill #0

## 2020-10-15 ENCOUNTER — Encounter: Payer: Self-pay | Admitting: Registered Nurse

## 2020-10-15 ENCOUNTER — Encounter: Payer: Self-pay | Admitting: Gastroenterology

## 2020-10-15 ENCOUNTER — Other Ambulatory Visit: Payer: Self-pay

## 2020-10-15 ENCOUNTER — Ambulatory Visit (INDEPENDENT_AMBULATORY_CARE_PROVIDER_SITE_OTHER): Payer: No Typology Code available for payment source | Admitting: Registered Nurse

## 2020-10-15 ENCOUNTER — Ambulatory Visit (INDEPENDENT_AMBULATORY_CARE_PROVIDER_SITE_OTHER): Payer: No Typology Code available for payment source | Admitting: Gastroenterology

## 2020-10-15 VITALS — BP 120/70 | HR 92 | Ht 65.0 in | Wt 195.8 lb

## 2020-10-15 VITALS — BP 120/78 | HR 118 | Temp 98.1°F | Resp 17 | Ht 65.0 in | Wt 196.4 lb

## 2020-10-15 DIAGNOSIS — R1115 Cyclical vomiting syndrome unrelated to migraine: Secondary | ICD-10-CM | POA: Diagnosis not present

## 2020-10-15 DIAGNOSIS — F1290 Cannabis use, unspecified, uncomplicated: Secondary | ICD-10-CM

## 2020-10-15 DIAGNOSIS — R9389 Abnormal findings on diagnostic imaging of other specified body structures: Secondary | ICD-10-CM | POA: Diagnosis not present

## 2020-10-15 DIAGNOSIS — F411 Generalized anxiety disorder: Secondary | ICD-10-CM

## 2020-10-15 NOTE — Progress Notes (Signed)
Established Patient Office Visit  Subjective:  Patient ID: Diana Thomas, female    DOB: 24-May-1995  Age: 25 y.o. MRN: [id number]  CC:  Chief Complaint  Patient presents with   Follow-up    Med recheck    HPI Diana Thomas presents for med recheck  Depression and Anxiety No acute concerns Going well without AE Taking alprazolam 0.5mg  po bid PRN Seroquel 100mg  po qhs   Also for headaches taking elavil 50mg  po qhs and sumatriptan 20mg  nasal spray qd prn  Past Medical History:  Diagnosis Date   Allergy    Anemia    Anxiety    Cannabinoid hyperemesis syndrome    Depression    Esophagitis    Gastritis    GERD (gastroesophageal reflux disease)    Heart murmur     Past Surgical History:  Procedure Laterality Date   BIOPSY  07/23/2020   Procedure: BIOPSY;  Surgeon: , MD;  Location: [facility];  Service: Gastroenterology;;   ESOPHAGOGASTRODUODENOSCOPY (EGD) WITH PROPOFOL N/A 07/23/2020   Procedure: ESOPHAGOGASTRODUODENOSCOPY (EGD) WITH PROPOFOL;  Surgeon: 09/22/2020, MD;  Location: [facility];  Service: Gastroenterology;  Laterality: N/A;   NO PAST SURGERIES      Family History  Problem Relation Age of Onset   Heart disease Mother    Hypertension Mother    Hyperlipidemia Mother    ADD / ADHD Father    Healthy Father    Healthy Sister    Liver disease Maternal Grandmother    Diabetes Maternal Uncle    Colon cancer Neg Hx    Esophageal cancer Neg Hx    Stomach cancer Neg Hx    Rectal cancer Neg Hx    Pancreatic cancer Neg Hx     Social History   Socioeconomic History   Marital status: Single    Spouse name: Not on file   Number of children: 0   Years of education: Not on file   Highest education level: Not on file  Occupational History   Occupation: RN  Tobacco Use   Smoking status: Never   Smokeless tobacco: Never  Vaping Use   Vaping Use: Never used  Substance and Sexual Activity   Alcohol use: Not Currently   Drug use: Yes     Types: Marijuana    Comment: rare   Sexual activity: Yes  Other Topics Concern   Not on file  Social History Narrative   Not on file   Social Determinants of Health   Financial Resource Strain: Not on file  Food Insecurity: Not on file  Transportation Needs: Not on file  Physical Activity: Not on file  Stress: Not on file  Social Connections: Not on file  Intimate Partner Violence: Not on file    Outpatient Medications Prior to Visit  Medication Sig Dispense Refill   amitriptyline (ELAVIL) 50 MG tablet Take 1 tablet (50 mg total) by mouth at bedtime. 30 tablet 1   capsaicin (ZOSTRIX) 0.025 % cream Apply topically 2 (two) times daily. 60 g 0   famotidine (PEPCID) 10 MG tablet Take 1 tablet (10 mg total) by mouth 2 (two) times daily. 60 tablet 0   medroxyPROGESTERone (DEPO-PROVERA) 150 MG/ML injection Inject 1 mL (150 mg total) into the muscle every 3 (three) months. 1 mL 2   ondansetron (ZOFRAN ODT) 8 MG disintegrating tablet Take 1 tablet (8 mg total) by mouth every 8 (eight) hours as needed for nausea or vomiting. 30 tablet 0  pantoprazole (PROTONIX) 40 MG tablet Take 1 tablet (40 mg total) by mouth daily. 30 tablet 1   promethazine (PHENERGAN) 12.5 MG tablet Take 1 tablet (12.5 mg total) by mouth every 8 (eight) hours as needed for nausea or vomiting. 20 tablet 0   SUMAtriptan (IMITREX) 20 MG/ACT nasal spray Place 1 spray (20 mg total) into the nose every 2 (two) hours as needed for migraine or headache. May repeat in 2 hours if abdominal pain/vomiting persists or recurs. Do not take more than 2 doses in a day and 6 doses in a week 1 each 0   escitalopram (LEXAPRO) 20 MG tablet TAKE 1 TABLET BY MOUTH AT BEDTIME 90 tablet 1   QUEtiapine (SEROQUEL) 100 MG tablet TAKE 1 TABLET BY MOUTH AT BEDTIME 90 tablet 0   ALPRAZolam (NIRAVAM) 0.5 MG dissolvable tablet Take 1 tablet (0.5 mg total) by mouth 2 (two) times daily as needed for anxiety. 30 tablet 0   dicyclomine (BENTYL) 20 MG  tablet Take 1 tablet (20 mg total) by mouth every 8 (eight) hours as needed for spasms (Abdominal cramping). (Patient not taking: Reported on 04/28/2020) 15 tablet 0   esomeprazole (NEXIUM) 40 MG capsule Take 40 mg by mouth daily at 12 noon. OTC (Patient not taking: Reported on 04/28/2020)     prochlorperazine (COMPAZINE) 10 MG/2ML injection Inject 2 mLs (10 mg total) into the vein every 6 (six) hours as needed. (Patient not taking: No sig reported) 240 mL 0   No facility-administered medications prior to visit.    No Known Allergies  ROS Review of Systems  Constitutional: Negative.   HENT: Negative.    Eyes: Negative.   Respiratory: Negative.    Cardiovascular: Negative.   Gastrointestinal: Negative.   Genitourinary: Negative.   Musculoskeletal: Negative.   Skin: Negative.   Neurological: Negative.   Psychiatric/Behavioral: Negative.    All other systems reviewed and are negative.    Objective:    Physical Exam Vitals and nursing note reviewed.  Constitutional:      General: She is not in acute distress.    Appearance: Normal appearance. She is normal weight. She is not ill-appearing, toxic-appearing or diaphoretic.  Cardiovascular:     Rate and Rhythm: Normal rate and regular rhythm.     Heart sounds: Normal heart sounds. No murmur heard.   No friction rub. No gallop.  Pulmonary:     Effort: Pulmonary effort is normal. No respiratory distress.     Breath sounds: Normal breath sounds. No stridor. No wheezing, rhonchi or rales.  Chest:     Chest wall: No tenderness.  Skin:    General: Skin is warm and dry.  Neurological:     General: No focal deficit present.     Mental Status: She is alert and oriented to person, place, and time. Mental status is at baseline.  Psychiatric:        Mood and Affect: Mood normal.        Behavior: Behavior normal.        Thought Content: Thought content normal.        Judgment: Judgment normal.    BP 120/78   Pulse (!) 118   Temp 98.1  F (36.7 C) (Temporal)   Resp 17   Ht 5\' 5"  (1.651 m)   Wt 196 lb 6.4 oz (89.1 kg)   SpO2 99%   BMI 32.68 kg/m  Wt Readings from Last 3 Encounters:  10/15/20 195 lb 12.8 oz (88.8 kg)  10/15/20  196 lb 6.4 oz (89.1 kg)  07/23/20 179 lb 0.2 oz (81.2 kg)     Health Maintenance Due  Topic Date Due   INFLUENZA VACCINE  09/15/2020   PAP-Cervical Cytology Screening  09/16/2020   PAP SMEAR-Modifier  09/16/2020    There are no preventive care reminders to display for this patient.  Lab Results  Component Value Date   TSH 0.575 09/16/2017   Lab Results  Component Value Date   WBC 10.9 (H) 07/24/2020   HGB 13.0 07/24/2020   HCT 39.9 07/24/2020   MCV 87.3 07/24/2020   PLT 246 07/24/2020   Lab Results  Component Value Date   NA 132 (L) 07/24/2020   K 3.2 (L) 07/24/2020   CO2 23 07/24/2020   GLUCOSE 95 07/24/2020   BUN 5 (L) 07/24/2020   CREATININE 0.80 07/24/2020   BILITOT 0.9 07/23/2020   ALKPHOS 53 07/23/2020   AST 12 (L) 07/23/2020   ALT 18 07/23/2020   PROT 7.2 07/23/2020   ALBUMIN 4.1 07/23/2020   CALCIUM 9.0 07/24/2020   ANIONGAP 8 07/24/2020   No results found for: CHOL No results found for: HDL No results found for: LDLCALC No results found for: TRIG No results found for: CHOLHDL Lab Results  Component Value Date   HGBA1C 5.6 07/20/2020      Assessment & Plan:   Problem List Items Addressed This Visit       Other   Generalized anxiety disorder - Primary    No orders of the defined types were placed in this encounter.   Follow-up: No follow-ups on file.   PLAN Maintain current meds at current doses. Ok to refill x 6 mo Return if subtherapeutic effect or if symptoms change. Patient encouraged to call clinic with any questions, comments, or concerns.  Janeece Agee, NP

## 2020-10-15 NOTE — Progress Notes (Signed)
HPI :  25 y/o female known to me with a history of anxiety, GERD, abdominal pain, who is here for follow up post hospitalization. She was seen previously for intermittent episodes of nausea / vomiting. EGD was normal in 2020. I had placed her on buspirone to help with anxiety / dyspepsia.  I had not seen her since 2020.  She was unfortunately mid to the hospital this past June for refractory nausea vomiting.  Dr. Rhea Belton consulted on her case, she was in the hospital for 4 to 5 days.  She has been using marijuana frequently to treat her symptoms and it was not clear if her symptoms were due to cannabinoid hyperemesis syndrome versus cyclical vomiting syndrome.  It took some time for her to improve with traditional antiemetics.  She had an upper endoscopy with Dr. Rhea Belton on June 8 which showed some grade B esophagitis in the setting of vomiting, otherwise no concerning pathology.  She had biopsies of her esophagus, stomach, small intestine which were normal.  She was given some sumatriptan to try to abort her nausea vomiting which did help some.  She had her BuSpar stopped and was started on Elavil 50 mg and eventually discharged on that and Protonix and antiemetics as needed.  She states since her discharge she has been doing much better.  She is had only 1 episode of vomiting and generally has been eating well without nausea.  She did have some weight loss when she was in the hospital but has since regained her weight and is eating well.  She takes Protonix 40 mg a day and her Elavil 50 mg 2 nightly.  She states she tolerates both these well.  She is not taking much Pepcid, has not needed her Zofran or Phenergan.  Has not needed the Imitrex which she has at home in case recurrence.  She states she is not really using any marijuana.  She states prior to her hospitalization she was having cycles of vomiting while feeling well in between.  She is now having a trouble with her bowels at all.  Recall that she  previously declined a colonoscopy after CT showed thickening of her colon, she is not interested in pursuing that.   Prior workup thus far: H pylori breath test negative 06/23/17   US abdomen 10/03/17 - normal   CT abdomen / pelvis 01/10/18 - mild diffuse circumferential colonic wall thickening, difficult to exclude colitis.  Patient declined colonoscopy  Right upper quadrant ultrasound 06/01/2019: Normal  EGD 03/02/18 -  - The exam of the esophagus was otherwise normal. - The entire examined stomach was normal. Biopsies were taken with a cold forceps for Helicobacter pylori testing. - The duodenal bulb and second portion of the duodenum were normal.  Surgical [P], gastric antrum and gastric body - GASTRIC, ANTRAL, OXYNTIC AND INTESTINAL TYPE MUCOSA WITH NO SPECIFIC HISTOPATHOLOGIC CHANGES. SEE NOTE. - WARTHIN-STARRY STAIN IS NEGATIVE FOR HELICOBACTER PYLORI.   EGD 07/23/20 - LA Grade B (one or more mucosal breaks greater than 5 mm, not extending between the tops of two mucosal folds) esophagitis with no bleeding was found in the lower third of the esophagus. Biopsies were taken with a cold forceps for histology. Findings: Localized mild inflammation characterized by congestion (edema) and erythema was found in the gastric fundus. Biopsies were taken with a cold forceps for histology. The gastroesophageal flap valve was visualized endoscopically and classified as Hill Grade IV (no fold, wide open lumen, hiatal hernia present). The  gastric body, incisura and gastric antrum were normal. Biopsies were taken with a cold forceps for histology and Helicobacter pylori testing. The examined duodenum was normal. Biopsies were taken with a cold forceps for histology.  FINAL MICROSCOPIC DIAGNOSIS:   A. DUODENUM, BIOPSY:  - Duodenal mucosa with no specific histopathologic changes  - Negative for increased intraepithelial lymphocytes or villous  architectural changes   B. GASTRIC, ANTRUM,  BIOPSY:  - Gastric antral mucosa with no specific histopathologic changes  - Warthin Starry stain is negative for Helicobacter pylori   C. GASTRIC, FUNDUS, BODY, BIOPSY:  - Gastric oxyntic mucosa with no specific histopathologic changes  - Warthin Starry stain is negative for Helicobacter pylori   D. ESOPHAGUS, DISTAL, BIOPSY:  - Mild chronic esophagitis with reactive changes, and mild vascular  congestion, suggestive of mild reflux esophagitis  - Negative for increased intraepithelial eosinophils       Past Medical History:  Diagnosis Date   Allergy    Anemia    Anxiety    Cannabinoid hyperemesis syndrome    Depression    Esophagitis    Gastritis    GERD (gastroesophageal reflux disease)    Heart murmur      Past Surgical History:  Procedure Laterality Date   BIOPSY  07/23/2020   Procedure: BIOPSY;  Surgeon: Beverley Fiedler, MD;  Location: [facility];  Service: Gastroenterology;;   ESOPHAGOGASTRODUODENOSCOPY (EGD) WITH PROPOFOL N/A 07/23/2020   Procedure: ESOPHAGOGASTRODUODENOSCOPY (EGD) WITH PROPOFOL;  Surgeon: Beverley Fiedler, MD;  Location: [facility];  Service: Gastroenterology;  Laterality: N/A;   NO PAST SURGERIES     Family History  Problem Relation Age of Onset   Heart disease Mother    Hypertension Mother    Hyperlipidemia Mother    ADD / ADHD Father    Healthy Father    Healthy Sister    Liver disease Maternal Grandmother    Diabetes Maternal Uncle    Colon cancer Neg Hx    Esophageal cancer Neg Hx    Stomach cancer Neg Hx    Rectal cancer Neg Hx    Pancreatic cancer Neg Hx    Social History   Tobacco Use   Smoking status: Never   Smokeless tobacco: Never  Vaping Use   Vaping Use: Never used  Substance Use Topics   Alcohol use: Not Currently   Drug use: Yes    Types: Marijuana    Comment: rare   Current Outpatient Medications  Medication Sig Dispense Refill   ALPRAZolam (NIRAVAM) 0.5 MG dissolvable tablet Take 1 tablet (0.5 mg total) by  mouth 2 (two) times daily as needed for anxiety. 30 tablet 0   amitriptyline (ELAVIL) 50 MG tablet Take 1 tablet (50 mg total) by mouth at bedtime. 30 tablet 1   capsaicin (ZOSTRIX) 0.025 % cream Apply topically 2 (two) times daily. 60 g 0   famotidine (PEPCID) 10 MG tablet Take 1 tablet (10 mg total) by mouth 2 (two) times daily. 60 tablet 0   medroxyPROGESTERone (DEPO-PROVERA) 150 MG/ML injection Inject 1 mL (150 mg total) into the muscle every 3 (three) months. 1 mL 2   ondansetron (ZOFRAN ODT) 8 MG disintegrating tablet Take 1 tablet (8 mg total) by mouth every 8 (eight) hours as needed for nausea or vomiting. 30 tablet 0   pantoprazole (PROTONIX) 40 MG tablet Take 1 tablet (40 mg total) by mouth daily. 30 tablet 1   promethazine (PHENERGAN) 12.5 MG tablet Take 1 tablet (12.5 mg total) by  mouth every 8 (eight) hours as needed for nausea or vomiting. 20 tablet 0   QUEtiapine (SEROQUEL) 100 MG tablet TAKE 1 TABLET BY MOUTH AT BEDTIME 90 tablet 0   SUMAtriptan (IMITREX) 20 MG/ACT nasal spray Place 1 spray (20 mg total) into the nose every 2 (two) hours as needed for migraine or headache. May repeat in 2 hours if abdominal pain/vomiting persists or recurs. Do not take more than 2 doses in a day and 6 doses in a week 1 each 0   No current facility-administered medications for this visit.   No Known Allergies   Review of Systems: All systems reviewed and negative except where noted in HPI.   Lab Results  Component Value Date   WBC 10.9 (H) 07/24/2020   HGB 13.0 07/24/2020   HCT 39.9 07/24/2020   MCV 87.3 07/24/2020   PLT 246 07/24/2020    Lab Results  Component Value Date   CREATININE 0.80 07/24/2020   BUN 5 (L) 07/24/2020   NA 132 (L) 07/24/2020   K 3.2 (L) 07/24/2020   CL 101 07/24/2020   CO2 23 07/24/2020    Lab Results  Component Value Date   ALT 18 07/23/2020   AST 12 (L) 07/23/2020   ALKPHOS 53 07/23/2020   BILITOT 0.9 07/23/2020      Physical Exam: BP 120/70    Pulse 92   Ht 5\' 5"  (1.651 m)   Wt 195 lb 12.8 oz (88.8 kg)   BMI 32.58 kg/m  Constitutional: Pleasant,well-developed, female in no acute distress. Neurological: Alert and oriented to person place and time. Psychiatric: Normal mood and affect. Behavior is normal.   ASSESSMENT AND PLAN: 25 year old female here for reassessment following:  Cyclical vomiting Marijuana use Abnormal CT scan  As above, patient admitted to the hospital for a few days of severe/refractory nausea vomiting in setting of marijuana use.  Unclear if she had cannabinoid hyperemesis syndrome but history certainly could support cyclical vomiting syndrome.  She has since stopped her marijuana use, stopped buspirone and transition to Elavil 50 mg nightly which she is tolerating.  She has been taking Protonix daily and for the most part doing really well on this regimen.  She has not used her antiemetics much at all and has not had to use her abortive therapy, sumatriptan.  We discussed her course to date.  I recommend she avoid marijuana moving forward and continue this regimen as it is working quite well for since her hospitalization and hopefully this stops the cycle of nausea and vomiting.  Would like to follow her up over time to make sure she is feeling well and if she has any flares of symptoms in the interim she should use her sumatriptan at earliest opportunity.  Plan: - continue Elavil, protonix, antiemetics PRN - sumitriptan for recurrence of nausea / vomiting episode - continue to avoid marijuana - f/u 6 months  32, MD [facility]

## 2020-10-15 NOTE — Patient Instructions (Signed)
If you are age 27 or older, your body mass index should be between 23-30. Your Body mass index is 32.58 kg/m. If this is out of the aforementioned range listed, please consider follow up with your Primary Care Provider.  If you are age 35 or younger, your body mass index should be between 19-25. Your Body mass index is 32.58 kg/m. If this is out of the aformentioned range listed, please consider follow up with your Primary Care Provider.   __________________________________________________________  The [facility] providers would like to encourage you to use [facility] to communicate with providers for non-urgent requests or questions.  Due to long hold times on the telephone, sending your provider a message by [facility] may be a faster and more efficient way to get a response.  Please allow 48 business hours for a response.  Please remember that this is for non-urgent requests.     Continue medications.   Please follow up in 6 months.   Thank you for entrusting me with your care and for choosing [facility], Dr. Ileene Patrick

## 2020-10-21 ENCOUNTER — Other Ambulatory Visit: Payer: Self-pay | Admitting: Registered Nurse

## 2020-10-21 DIAGNOSIS — R1115 Cyclical vomiting syndrome unrelated to migraine: Secondary | ICD-10-CM

## 2020-10-22 ENCOUNTER — Encounter: Payer: Self-pay | Admitting: Obstetrics

## 2020-10-29 ENCOUNTER — Other Ambulatory Visit: Payer: Self-pay

## 2020-10-29 ENCOUNTER — Emergency Department (HOSPITAL_BASED_OUTPATIENT_CLINIC_OR_DEPARTMENT_OTHER)
Admission: EM | Admit: 2020-10-29 | Discharge: 2020-10-29 | Disposition: A | Discharge status: Home / Self Care | Payer: No Typology Code available for payment source | Attending: Emergency Medicine | Admitting: Emergency Medicine

## 2020-10-29 ENCOUNTER — Other Ambulatory Visit: Payer: Self-pay | Admitting: Registered Nurse

## 2020-10-29 ENCOUNTER — Encounter (HOSPITAL_BASED_OUTPATIENT_CLINIC_OR_DEPARTMENT_OTHER): Payer: Self-pay | Admitting: Urology

## 2020-10-29 DIAGNOSIS — R1115 Cyclical vomiting syndrome unrelated to migraine: Secondary | ICD-10-CM | POA: Diagnosis not present

## 2020-10-29 DIAGNOSIS — R112 Nausea with vomiting, unspecified: Secondary | ICD-10-CM

## 2020-10-29 DIAGNOSIS — R1110 Vomiting, unspecified: Secondary | ICD-10-CM | POA: Diagnosis present

## 2020-10-29 LAB — COMPREHENSIVE METABOLIC PANEL
ALT: 20 U/L (ref 0–44)
AST: 19 U/L (ref 15–41)
Albumin: 5 g/dL (ref 3.5–5.0)
Alkaline Phosphatase: 64 U/L (ref 38–126)
Anion gap: 13 (ref 5–15)
BUN: 13 mg/dL (ref 6–20)
CO2: 28 mmol/L (ref 22–32)
Calcium: 10.1 mg/dL (ref 8.9–10.3)
Chloride: 96 mmol/L — ABNORMAL LOW (ref 98–111)
Creatinine, Ser: 0.86 mg/dL (ref 0.44–1.00)
GFR, Estimated: >60 mL/min (ref >60)
Glucose, Bld: 121 mg/dL — ABNORMAL HIGH (ref 70–99)
Potassium: 3.3 mmol/L — ABNORMAL LOW (ref 3.5–5.1)
Sodium: 137 mmol/L (ref 135–145)
Total Bilirubin: 0.4 mg/dL (ref 0.3–1.2)
Total Protein: 8.7 g/dL — ABNORMAL HIGH (ref 6.5–8.1)

## 2020-10-29 LAB — CBC WITH DIFFERENTIAL/PLATELET
Abs Immature Granulocytes: 0.06 10*3/uL (ref 0.00–0.07)
Basophils Absolute: 0 10*3/uL (ref 0.0–0.1)
Basophils Relative: 0 %
Eosinophils Absolute: 0 10*3/uL (ref 0.0–0.5)
Eosinophils Relative: 0 %
HCT: 44 % (ref 36.0–46.0)
Hemoglobin: 14.4 g/dL (ref 12.0–15.0)
Immature Granulocytes: 0 %
Lymphocytes Relative: 14 %
Lymphs Abs: 2 10*3/uL (ref 0.7–4.0)
MCH: 27.7 pg (ref 26.0–34.0)
MCHC: 32.7 g/dL (ref 30.0–36.0)
MCV: 84.6 fL (ref 80.0–100.0)
Monocytes Absolute: 0.7 10*3/uL (ref 0.1–1.0)
Monocytes Relative: 5 %
Neutro Abs: 11.4 10*3/uL — ABNORMAL HIGH (ref 1.7–7.7)
Neutrophils Relative %: 81 %
Platelets: 373 10*3/uL (ref 150–400)
RBC: 5.2 MIL/uL — ABNORMAL HIGH (ref 3.87–5.11)
RDW: 12.6 % (ref 11.5–15.5)
WBC: 14.2 10*3/uL — ABNORMAL HIGH (ref 4.0–10.5)
nRBC: 0 % (ref 0.0–0.2)

## 2020-10-29 LAB — URINALYSIS, ROUTINE W REFLEX MICROSCOPIC
Bilirubin Urine: NEGATIVE
Glucose, UA: NEGATIVE mg/dL
Hgb urine dipstick: NEGATIVE
Ketones, ur: >80 mg/dL — AB
Leukocytes,Ua: NEGATIVE
Nitrite: NEGATIVE
Protein, ur: 100 mg/dL — AB
Specific Gravity, Urine: 1.036 — ABNORMAL HIGH (ref 1.005–1.030)
pH: 6.5 (ref 5.0–8.0)

## 2020-10-29 LAB — LIPASE, BLOOD: Lipase: 17 U/L (ref 11–51)

## 2020-10-29 LAB — PREGNANCY, URINE: Preg Test, Ur: NEGATIVE

## 2020-10-29 MED ORDER — PROMETHAZINE HCL 12.5 MG PO TABS: 12.5000 mg | ORAL_TABLET | Freq: Three times a day (TID) | ORAL | 0 refills | Status: DC | PRN

## 2020-10-29 MED ORDER — HALOPERIDOL LACTATE 5 MG/ML IJ SOLN
5.0000 mg | Freq: Once | INTRAMUSCULAR | Status: AC
  Administered 2020-10-29: 5 mg via INTRAVENOUS
  Filled 2020-10-29: qty 1

## 2020-10-29 MED ORDER — ALPRAZOLAM 0.5 MG PO TBDP: 0.5000 mg | ORAL_TABLET | Freq: Two times a day (BID) | ORAL | 0 refills | Status: DC | PRN

## 2020-10-29 MED ORDER — SODIUM CHLORIDE 0.9 % IV BOLUS
1000.0000 mL | Freq: Once | INTRAVENOUS | Status: AC
  Administered 2020-10-29: 1000 mL via INTRAVENOUS

## 2020-10-29 MED ORDER — LORAZEPAM 2 MG/ML IJ SOLN
0.5000 mg | Freq: Once | INTRAMUSCULAR | Status: AC
  Administered 2020-10-29: 0.5 mg via INTRAVENOUS
  Filled 2020-10-29: qty 1

## 2020-10-29 NOTE — ED Notes (Signed)
Daily THC user, last use this am.

## 2020-10-29 NOTE — ED Triage Notes (Signed)
Pt states vomiting since Sunday morning, states h/o cyclic vomiting.  Took zofran, xanax and imitrex x 1 hr ago.

## 2020-10-29 NOTE — ED Provider Notes (Signed)
[facility] EMERGENCY DEPT Provider Note   CSN: [id number] Arrival date & time: 10/29/20  1554     History Chief Complaint  Patient presents with   Emesis    Diana Thomas is a 25 y.o. female.  Patient with history of hyperemesis.  Home medications have been held.  She denies any focal abdominal pain.  More cramping abdominal pain typical of her hyperemesis.  She has run out of her Phenergan.  Denies any blood in her vomit or stool.  The history is provided by the patient.  Emesis Severity:  Moderate Timing:  Intermittent Progression:  Unchanged Chronicity:  Recurrent Relieved by:  Nothing Worsened by:  Nothing Associated symptoms: abdominal pain   Associated symptoms: no arthralgias, no chills, no cough, no diarrhea, no fever, no headaches, no myalgias, no sore throat and no URI       Past Medical History:  Diagnosis Date   Allergy    Anemia    Anxiety    Cannabinoid hyperemesis syndrome    Depression    Esophagitis    Gastritis    GERD (gastroesophageal reflux disease)    Heart murmur     Patient Active Problem List   Diagnosis Date Noted   Acute esophagitis    Acute gastritis without hemorrhage    Nausea & vomiting 07/22/2020   Cyclical vomiting 07/21/2020   Hypokalemia 07/21/2020   Intractable vomiting    Cannabinoid hyperemesis syndrome 12/12/2019   Moderate episode of recurrent major depressive disorder (HCC) 04/29/2018   Gastroesophageal reflux disease without esophagitis 04/29/2018   Generalized anxiety disorder 09/16/2017   Current moderate episode of major depressive disorder without prior episode (HCC) 07/16/2016   Acute upper respiratory infection 11/02/2007   Constipation 11/02/2007   Other atopic dermatitis and related conditions 11/02/2007    Past Surgical History:  Procedure Laterality Date   BIOPSY  07/23/2020   Procedure: BIOPSY;  Surgeon: Beverley Fiedler, MD;  Location: [facility];  Service: Gastroenterology;;    ESOPHAGOGASTRODUODENOSCOPY (EGD) WITH PROPOFOL N/A 07/23/2020   Procedure: ESOPHAGOGASTRODUODENOSCOPY (EGD) WITH PROPOFOL;  Surgeon: Beverley Fiedler, MD;  Location: [facility];  Service: Gastroenterology;  Laterality: N/A;   NO PAST SURGERIES       OB History     Gravida  1   Para      Term      Preterm      AB      Living         SAB      IAB      Ectopic      Multiple      Live Births              Family History  Problem Relation Age of Onset   Heart disease Mother    Hypertension Mother    Hyperlipidemia Mother    ADD / ADHD Father    Healthy Father    Healthy Sister    Liver disease Maternal Grandmother    Diabetes Maternal Uncle    Colon cancer Neg Hx    Esophageal cancer Neg Hx    Stomach cancer Neg Hx    Rectal cancer Neg Hx    Pancreatic cancer Neg Hx     Social History   Tobacco Use   Smoking status: Never   Smokeless tobacco: Never  Vaping Use   Vaping Use: Never used  Substance Use Topics   Alcohol use: Not Currently   Drug use: Yes  Types: Marijuana    Comment: daily    Home Medications Prior to Admission medications   Medication Sig Start Date End Date Taking? Authorizing Provider  ALPRAZolam (NIRAVAM) 0.5 MG dissolvable tablet Take 1 tablet (0.5 mg total) by mouth 2 (two) times daily as needed for anxiety. 10/29/20   Jaelen Soth, DO  amitriptyline (ELAVIL) 50 MG tablet TAKE 1 TABLET BY MOUTH EVERYDAY AT BEDTIME 10/29/20   Janeece Agee, NP  capsaicin (ZOSTRIX) 0.025 % cream Apply topically 2 (two) times daily. 07/14/20   Lorre Nick, MD  famotidine (PEPCID) 10 MG tablet Take 1 tablet (10 mg total) by mouth 2 (two) times daily. 12/03/19 10/15/20  Calvert Cantor, CNM  medroxyPROGESTERone (DEPO-PROVERA) 150 MG/ML injection Inject 1 mL (150 mg total) into the muscle every 3 (three) months. 08/27/20   Janeece Agee, NP  ondansetron (ZOFRAN ODT) 8 MG disintegrating tablet Take 1 tablet (8 mg total) by mouth every 8 (eight)  hours as needed for nausea or vomiting. 07/24/20   Erick Blinks, MD  pantoprazole (PROTONIX) 40 MG tablet TAKE 1 TABLET BY MOUTH EVERY DAY 10/29/20   Janeece Agee, NP  promethazine (PHENERGAN) 12.5 MG tablet Take 1 tablet (12.5 mg total) by mouth every 8 (eight) hours as needed for nausea or vomiting. 10/29/20   Keiland Pickering, DO  QUEtiapine (SEROQUEL) 100 MG tablet TAKE 1 TABLET BY MOUTH EVERYDAY AT BEDTIME 10/21/20   Janeece Agee, NP  SUMAtriptan (IMITREX) 20 MG/ACT nasal spray Place 1 spray (20 mg total) into the nose every 2 (two) hours as needed for migraine or headache. May repeat in 2 hours if abdominal pain/vomiting persists or recurs. Do not take more than 2 doses in a day and 6 doses in a week 07/24/20   Erick Blinks, MD    Allergies    Patient has no known allergies.  Review of Systems   Review of Systems  Constitutional:  Negative for chills and fever.  HENT:  Negative for ear pain and sore throat.   Eyes:  Negative for pain and visual disturbance.  Respiratory:  Negative for cough and shortness of breath.   Cardiovascular:  Negative for chest pain and palpitations.  Gastrointestinal:  Positive for abdominal pain and vomiting. Negative for diarrhea.  Genitourinary:  Negative for dysuria and hematuria.  Musculoskeletal:  Negative for arthralgias, back pain and myalgias.  Skin:  Negative for color change and rash.  Neurological:  Negative for seizures, syncope and headaches.  All other systems reviewed and are negative.  Physical Exam Updated Vital Signs  ED Triage Vitals  Enc Vitals Group     BP 10/29/20 1618 (!) 172/109     Pulse Rate 10/29/20 1618 86     Resp 10/29/20 1618 18     Temp 10/29/20 1618 98.5 F (36.9 C)     Temp src --      SpO2 10/29/20 1615 100 %     Weight 10/29/20 1615 195 lb 12.8 oz (88.8 kg)     Height 10/29/20 1615 5\' 5"  (1.651 m)     Head Circumference --      Peak Flow --      Pain Score 10/29/20 1615 9     Pain Loc --      Pain Edu? --       Excl. in GC? --     Physical Exam Vitals and nursing note reviewed.  Constitutional:      General: She is not in acute distress.  Appearance: She is well-developed. She is not ill-appearing.  HENT:     Head: Normocephalic and atraumatic.     Nose: Nose normal.     Mouth/Throat:     Mouth: Mucous membranes are moist.  Eyes:     Extraocular Movements: Extraocular movements intact.     Conjunctiva/sclera: Conjunctivae normal.     Pupils: Pupils are equal, round, and reactive to light.  Cardiovascular:     Rate and Rhythm: Normal rate and regular rhythm.     Pulses: Normal pulses.     Heart sounds: Normal heart sounds. No murmur heard. Pulmonary:     Effort: Pulmonary effort is normal. No respiratory distress.     Breath sounds: Normal breath sounds.  Abdominal:     Palpations: Abdomen is soft.     Tenderness: There is no abdominal tenderness.  Musculoskeletal:     Cervical back: Neck supple.  Skin:    General: Skin is warm and dry.     Capillary Refill: Capillary refill takes less than 2 seconds.  Neurological:     General: No focal deficit present.     Mental Status: She is alert.  Psychiatric:        Mood and Affect: Mood normal.    ED Results / Procedures / Treatments   Labs (all labs ordered are listed, but only abnormal results are displayed) Labs Reviewed  CBC WITH DIFFERENTIAL/PLATELET - Abnormal; Notable for the following components:      Result Value   WBC 14.2 (*)    RBC 5.20 (*)    Neutro Abs 11.4 (*)    All other components within normal limits  COMPREHENSIVE METABOLIC PANEL - Abnormal; Notable for the following components:   Potassium 3.3 (*)    Chloride 96 (*)    Glucose, Bld 121 (*)    Total Protein 8.7 (*)    All other components within normal limits  URINALYSIS, ROUTINE W REFLEX MICROSCOPIC - Abnormal; Notable for the following components:   Specific Gravity, Urine 1.036 (*)    Ketones, ur >80 (*)    Protein, ur 100 (*)    All other  components within normal limits  LIPASE, BLOOD  PREGNANCY, URINE    EKG EKG Interpretation  Date/Time:  Wednesday October 29 2020 19:25:39 EDT Ventricular Rate:  69 PR Interval:  123 QRS Duration: 77 QT Interval:  369 QTC Calculation: 396 R Axis:   79 Text Interpretation: Ectopic atrial rhythm Confirmed by Lockie Mola, Rona Tomson ([id number]) on 10/29/2020 8:39:12 PM  Radiology No results found.  Procedures Procedures   Medications Ordered in ED Medications  haloperidol lactate (HALDOL) injection 5 mg (5 mg Intravenous Given 10/29/20 1931)  sodium chloride 0.9 % bolus 1,000 mL (0 mLs Intravenous Stopped 10/29/20 2134)  LORazepam (ATIVAN) injection 0.5 mg (0.5 mg Intravenous Given 10/29/20 2138)    ED Course  I have reviewed the triage vital signs and the nursing notes.  Pertinent labs & imaging results that were available during my care of the patient were reviewed by me and considered in my medical decision making (see chart for details).    MDM Rules/Calculators/A&P                           [name] is here with nausea and vomiting.  History of hyperemesis syndrome.  States similar feeling but home medications not helping.  She has been out of her Phenergan and alprazolam.  No major focal abdominal  pain on exam.  Just has cramping in her abdomen at times.  Pregnancy test is negative.  No UTI.  Lab work is overall unremarkable.  Lipase within normal limits.  Gallbladder liver enzymes within normal limits.  Doubt pancreatitis or hepatobiliary process such as cholecystitis.  Exam is not consistent with appendicitis.  She felt much improved after Haldol and Ativan and fluids.  Overall suspect hyperemesis flare.  We will refill her alprazolam and Phenergan.  Discharged in good condition.  This chart was dictated using voice recognition software.  Despite best efforts to proofread,  errors can occur which can change the documentation meaning.   Final Clinical Impression(s) / ED  Diagnoses Final diagnoses:  Nausea and vomiting, intractability of vomiting not specified, unspecified vomiting type  Cyclic vomiting syndrome    Rx / DC Orders ED Discharge Orders          Ordered    ALPRAZolam (NIRAVAM) 0.5 MG dissolvable tablet  2 times daily PRN        10/29/20 2155    promethazine (PHENERGAN) 12.5 MG tablet  Every 8 hours PRN        10/29/20 2155             Virgina Norfolk, DO 10/29/20 2158

## 2020-10-29 NOTE — ED Notes (Addendum)
error 

## 2020-10-29 NOTE — ED Notes (Signed)
Pt ambulated to restroom but states she could not urinate to provide a urine sample.

## 2020-11-02 ENCOUNTER — Other Ambulatory Visit: Payer: Self-pay | Admitting: Nurse Practitioner

## 2020-11-02 ENCOUNTER — Telehealth: Payer: Self-pay | Admitting: Nurse Practitioner

## 2020-11-02 MED ORDER — SUMATRIPTAN 20 MG/ACT NA SOLN: 20.0000 mg | NASAL | 0 refills | Status: DC | PRN

## 2020-11-02 MED ORDER — ONDANSETRON 8 MG PO TBDP: 8.0000 mg | ORAL_TABLET | Freq: Three times a day (TID) | ORAL | 0 refills | Status: DC | PRN

## 2020-11-02 NOTE — Telephone Encounter (Signed)
Patient's mother called answering service. Patient having recurrent nausea and vomiting and apparently out of nausea medications. Patient seen again in ED a few days ago.   She is taking Elavil and PPI but out of prn meds per mother. I  will send refill of Zofran 8 mg ODT.  I will also give a refill on Imitrex. I did not refill her phenergan

## 2020-11-03 ENCOUNTER — Telehealth: Payer: Self-pay | Admitting: Gastroenterology

## 2020-11-03 DIAGNOSIS — R1115 Cyclical vomiting syndrome unrelated to migraine: Secondary | ICD-10-CM

## 2020-11-03 NOTE — Telephone Encounter (Signed)
Lm on vm for patient to return call 

## 2020-11-03 NOTE — Telephone Encounter (Signed)
Pt returned call, she states that [facility] sent in Zofran and refilled Imitrex. Pt is requesting a refill of Phenergran. She states that her symptoms have improved right now, she states that she will improve and then back track. She sttaes that Zofran helps some but does not work as well as the Phenergan. She states that she takes the Imitrex to stop vomiting but the abdominal pain and nausea are causing most problems for her. Patient reports that her last BM was on 9/10 and she thinks that's the last day that she had solid foods. Pt states that all she had was a glass of water this morning, she has been on liquids only. Please advise, thanks.

## 2020-11-03 NOTE — Telephone Encounter (Signed)
Okay to refill phenergan if that works better for her. She should continue Elavil, imitrex as needed. Hopefully she has stopped smoking marijuana, she should continue to avoid that.  If she needs to be on liquids only for a day or 2 while she is getting better than is okay. Hopefully the phenergan helps. If she is constipated can use some Miralax BID and see if that helps. She had been doing better when I saw her last month. She can follow up with me or APP in the office as well. Thanks

## 2020-11-03 NOTE — Telephone Encounter (Signed)
Inbound call from pt requesting a call back stating that she is experiencing some nausea, vomiting, and abd pain. Please advise. Thank you.

## 2020-11-04 NOTE — Telephone Encounter (Signed)
Lm on vm for patient to return call 

## 2020-11-05 MED ORDER — PROMETHAZINE HCL 12.5 MG PO TABS: 12.5000 mg | ORAL_TABLET | Freq: Three times a day (TID) | ORAL | 1 refills | Status: DC | PRN

## 2020-11-05 NOTE — Addendum Note (Signed)
Addended by: Missy Sabins on: 11/05/2020 10:30 AM   Modules accepted: Orders

## 2020-11-05 NOTE — Telephone Encounter (Signed)
Spoke with patient in regards to recommendations. Pt would like refill of Phenergan sent to [facility] on [address]. Pt declined to schedule follow up at this time. She will call back to schedule appt at her convenience. Pt verbalized understanding and had no concerns at the end of the call.

## 2020-11-19 ENCOUNTER — Encounter: Payer: No Typology Code available for payment source | Admitting: Obstetrics

## 2020-11-28 ENCOUNTER — Other Ambulatory Visit: Payer: Self-pay | Admitting: Registered Nurse

## 2020-11-28 ENCOUNTER — Other Ambulatory Visit (HOSPITAL_COMMUNITY): Payer: Self-pay

## 2020-11-28 DIAGNOSIS — R1115 Cyclical vomiting syndrome unrelated to migraine: Secondary | ICD-10-CM

## 2020-11-28 MED ORDER — QUETIAPINE FUMARATE 100 MG PO TABS
ORAL_TABLET | ORAL | 0 refills | Status: DC
  Filled 2020-11-28: qty 90, 90d supply, fill #0

## 2020-11-28 MED FILL — Amitriptyline HCl Tab 50 MG: ORAL | 30 days supply | Qty: 30 | Fill #0 | Status: AC

## 2020-11-28 NOTE — Telephone Encounter (Signed)
Patient is requesting a refill of the following medications: Requested Prescriptions   Pending Prescriptions Disp Refills   QUEtiapine (SEROQUEL) 100 MG tablet 90 tablet 0    Sig: TAKE 1 TABLET BY MOUTH EVERYDAY AT BEDTIME    Date of patient request: 11/28/2020 Last office visit: 10/15/2020 Date of last refill: 10/21/2020 Last refill amount: 90 tablets  Follow up time period per chart: none

## 2020-11-29 ENCOUNTER — Other Ambulatory Visit: Payer: Self-pay | Admitting: Nurse Practitioner

## 2020-12-10 NOTE — Telephone Encounter (Signed)
Yes, please refill x 1 and make sure she is current with appointments with Dr Adela Lank

## 2020-12-10 NOTE — Telephone Encounter (Signed)
Thank you. Noted: Patient has recall OV 05-2021

## 2020-12-10 NOTE — Telephone Encounter (Signed)
LOV 10/15/20. Plan indicates f/u will be needed in 6 months. Reminder being sent to Berlinda Last, CMA to ensure pt has f/u scheduled for Feb 2023. Schedule not yet available

## 2020-12-12 ENCOUNTER — Ambulatory Visit: Payer: No Typology Code available for payment source

## 2020-12-15 ENCOUNTER — Other Ambulatory Visit: Payer: Self-pay

## 2020-12-15 ENCOUNTER — Ambulatory Visit (INDEPENDENT_AMBULATORY_CARE_PROVIDER_SITE_OTHER): Payer: No Typology Code available for payment source | Admitting: Family Medicine

## 2020-12-15 DIAGNOSIS — Z23 Encounter for immunization: Secondary | ICD-10-CM

## 2020-12-15 NOTE — Progress Notes (Signed)
Diana Thomas is a 25 y.o. female presents to the office today for Flu shot per physician's orders.  Lenard Simmer

## 2020-12-25 ENCOUNTER — Other Ambulatory Visit: Payer: Self-pay | Admitting: Registered Nurse

## 2020-12-30 ENCOUNTER — Other Ambulatory Visit (HOSPITAL_COMMUNITY): Payer: Self-pay

## 2020-12-30 ENCOUNTER — Ambulatory Visit (INDEPENDENT_AMBULATORY_CARE_PROVIDER_SITE_OTHER): Payer: No Typology Code available for payment source | Admitting: Registered Nurse

## 2020-12-30 ENCOUNTER — Encounter: Payer: Self-pay | Admitting: Registered Nurse

## 2020-12-30 VITALS — BP 130/80 | HR 122 | Temp 98.1°F | Resp 18 | Wt 196.2 lb

## 2020-12-30 DIAGNOSIS — F411 Generalized anxiety disorder: Secondary | ICD-10-CM | POA: Diagnosis not present

## 2020-12-30 MED ORDER — FLUOXETINE HCL 20 MG PO CAPS
ORAL_CAPSULE | ORAL | 0 refills | Status: DC
  Filled 2020-12-30: qty 90, 59d supply, fill #0

## 2020-12-30 NOTE — Progress Notes (Signed)
Established Patient Office Visit  Subjective:  Patient ID: Diana Thomas, female    DOB: 1996/01/01  Age: 25 y.o. MRN: [id number]  CC:  Chief Complaint  Patient presents with   Follow-up    Med recheck    HPI Diana Thomas presents for med check  Seen on 10/15/20 for GAD At that time was doing well with alprazolam 0.5mg  po bid prn, seroquel 100mg  po qhs Elavil for headaches and sumatriptan nasal spray prn.  Taking lexapro 20mg  po qhs  Breakthrough anxiety and depression - mostly depression. Denies hi/si Would like to change lexapro  Past Medical History:  Diagnosis Date   Allergy    Anemia    Anxiety    Cannabinoid hyperemesis syndrome    Depression    Esophagitis    Gastritis    GERD (gastroesophageal reflux disease)    Heart murmur     Past Surgical History:  Procedure Laterality Date   BIOPSY  07/23/2020   Procedure: BIOPSY;  Surgeon: , MD;  Location: [facility];  Service: Gastroenterology;;   ESOPHAGOGASTRODUODENOSCOPY (EGD) WITH PROPOFOL N/A 07/23/2020   Procedure: ESOPHAGOGASTRODUODENOSCOPY (EGD) WITH PROPOFOL;  Surgeon: Beverley Fiedler, MD;  Location: [facility];  Service: Gastroenterology;  Laterality: N/A;   NO PAST SURGERIES      Family History  Problem Relation Age of Onset   Heart disease Mother    Hypertension Mother    Hyperlipidemia Mother    ADD / ADHD Father    Healthy Father    Healthy Sister    Liver disease Maternal Grandmother    Diabetes Maternal Uncle    Colon cancer Neg Hx    Esophageal cancer Neg Hx    Stomach cancer Neg Hx    Rectal cancer Neg Hx    Pancreatic cancer Neg Hx     Social History   Socioeconomic History   Marital status: Single    Spouse name: Not on file   Number of children: 0   Years of education: Not on file   Highest education level: Not on file  Occupational History   Occupation: RN  Tobacco Use   Smoking status: Never   Smokeless tobacco: Never  Vaping Use   Vaping Use: Never  used  Substance and Sexual Activity   Alcohol use: Not Currently   Drug use: Yes    Types: Marijuana    Comment: daily   Sexual activity: Yes  Other Topics Concern   Not on file  Social History Narrative   Not on file   Social Determinants of Health   Financial Resource Strain: Not on file  Food Insecurity: Not on file  Transportation Needs: Not on file  Physical Activity: Not on file  Stress: Not on file  Social Connections: Not on file  Intimate Partner Violence: Not on file    Outpatient Medications Prior to Visit  Medication Sig Dispense Refill   amitriptyline (ELAVIL) 50 MG tablet TAKE 1 TABLET BY MOUTH DAILY 30 tablet 1   medroxyPROGESTERone (DEPO-PROVERA) 150 MG/ML injection Inject 1 mL (150 mg total) into the muscle every 3 (three) months. 1 mL 2   ondansetron (ZOFRAN ODT) 8 MG disintegrating tablet Take 1 tablet (8 mg total) by mouth every 8 (eight) hours as needed for nausea or vomiting. 30 tablet 0   pantoprazole (PROTONIX) 40 MG tablet TAKE 1 TABLET BY MOUTH EVERY DAY 30 tablet 1   promethazine (PHENERGAN) 12.5 MG tablet Take 1 tablet (12.5 mg total)  by mouth every 8 (eight) hours as needed for nausea or vomiting. 30 tablet 1   QUEtiapine (SEROQUEL) 100 MG tablet TAKE 1 TABLET BY MOUTH NIGHTLY AT BEDTIME 90 tablet 0   SUMAtriptan (IMITREX) 20 MG/ACT nasal spray PLACE 1 SPRAY (20 MG TOTAL) INTO THE NOSE EVERY 2 (TWO) HOURS AS NEEDED FOR MIGRAINE OR HEADACHE. MAY REPEAT IN 2 HOURS IF ABDOMINAL PAIN/VOMITING PERSISTS OR RECURS. DO NOT TAKE MORE THAN 2 DOSES IN A DAY AND 6 DOSES IN A WEEK 1 each 0   ALPRAZolam (NIRAVAM) 0.5 MG dissolvable tablet Take 1 tablet (0.5 mg total) by mouth 2 (two) times daily as needed for anxiety. 10 tablet 0   capsaicin (ZOSTRIX) 0.025 % cream Apply topically 2 (two) times daily. (Patient not taking: Reported on 12/30/2020) 60 g 0   famotidine (PEPCID) 10 MG tablet Take 1 tablet (10 mg total) by mouth 2 (two) times daily. 60 tablet 0   No  facility-administered medications prior to visit.    No Known Allergies  ROS Review of Systems  Constitutional: Negative.   HENT: Negative.    Eyes: Negative.   Respiratory: Negative.    Cardiovascular: Negative.   Gastrointestinal: Negative.   Endocrine: Negative.   Genitourinary: Negative.   Musculoskeletal: Negative.   Skin: Negative.   Allergic/Immunologic: Negative.   Neurological: Negative.   Hematological: Negative.   Psychiatric/Behavioral:  Positive for sleep disturbance. The patient is nervous/anxious.   All other systems reviewed and are negative.    Objective:    Physical Exam Vitals and nursing note reviewed.  Constitutional:      General: She is not in acute distress.    Appearance: Normal appearance. She is normal weight. She is not ill-appearing, toxic-appearing or diaphoretic.  Cardiovascular:     Rate and Rhythm: Normal rate and regular rhythm.     Heart sounds: Normal heart sounds. No murmur heard.   No friction rub. No gallop.  Pulmonary:     Effort: Pulmonary effort is normal. No respiratory distress.     Breath sounds: Normal breath sounds. No stridor. No wheezing, rhonchi or rales.  Chest:     Chest wall: No tenderness.  Skin:    General: Skin is warm and dry.  Neurological:     General: No focal deficit present.     Mental Status: She is alert and oriented to person, place, and time. Mental status is at baseline.  Psychiatric:        Mood and Affect: Mood normal.        Behavior: Behavior normal.        Thought Content: Thought content normal.        Judgment: Judgment normal.    BP 130/80   Pulse (!) 122   Temp 98.1 F (36.7 C) (Temporal)   Resp 18   Wt 196 lb 3.2 oz (89 kg)   SpO2 98%   BMI 32.65 kg/m  Wt Readings from Last 3 Encounters:  12/30/20 196 lb 3.2 oz (89 kg)  10/29/20 195 lb 12.8 oz (88.8 kg)  10/15/20 195 lb 12.8 oz (88.8 kg)     Health Maintenance Due  Topic Date Due   COVID-19 Vaccine (3 - Mixed Product risk  series) 08/29/2019   PAP-Cervical Cytology Screening  09/16/2020   PAP SMEAR-Modifier  09/16/2020    There are no preventive care reminders to display for this patient.  Lab Results  Component Value Date   TSH 0.575 09/16/2017   Lab Results  Component Value Date   WBC 14.2 (H) 10/29/2020   HGB 14.4 10/29/2020   HCT 44.0 10/29/2020   MCV 84.6 10/29/2020   PLT 373 10/29/2020   Lab Results  Component Value Date   NA 137 10/29/2020   K 3.3 (L) 10/29/2020   CO2 28 10/29/2020   GLUCOSE 121 (H) 10/29/2020   BUN 13 10/29/2020   CREATININE 0.86 10/29/2020   BILITOT 0.4 10/29/2020   ALKPHOS 64 10/29/2020   AST 19 10/29/2020   ALT 20 10/29/2020   PROT 8.7 (H) 10/29/2020   ALBUMIN 5.0 10/29/2020   CALCIUM 10.1 10/29/2020   ANIONGAP 13 10/29/2020   No results found for: CHOL No results found for: HDL No results found for: LDLCALC No results found for: TRIG No results found for: CHOLHDL Lab Results  Component Value Date   HGBA1C 5.6 07/20/2020      Assessment & Plan:   Problem List Items Addressed This Visit       Other   Generalized anxiety disorder - Primary   Relevant Medications   FLUoxetine (PROZAC) 20 MG capsule    Meds ordered this encounter  Medications   FLUoxetine (PROZAC) 20 MG capsule    Sig: Take 1 capsule (20 mg total) by mouth daily for 28 days, THEN 2 capsules (40 mg total) daily.    Dispense:  90 capsule    Refill:  0    Order Specific Question:   Supervising Provider    Answer:   Neva Seat, JEFFREY R [[id number]]    Follow-up: Return in about 7 weeks (around 02/17/2021) for Med check.   PLAN Cut lexapro in half for one week then stop Then start fluoxetine 20mg  po qd. If tolerating well, increase to 40mg  po qd after 3-4 weeks. Med check in 6-8 weeks. Reviewed risks, benefits, and AE of medication Patient encouraged to call clinic with any questions, comments, or concerns.  , NP

## 2020-12-30 NOTE — Patient Instructions (Signed)
Ms. Lengel -  [name] to see you  Cut lexapro in half for a few days then stop  Start fluoxetine after. If doing well after 3-4 weeks, can double  Check in in 6-8 weeks, we can adjust from there  Thank you,  Rich

## 2021-01-13 ENCOUNTER — Other Ambulatory Visit: Payer: Self-pay | Admitting: Registered Nurse

## 2021-01-13 ENCOUNTER — Other Ambulatory Visit (HOSPITAL_COMMUNITY): Payer: Self-pay

## 2021-01-13 MED ORDER — AMITRIPTYLINE HCL 50 MG PO TABS
ORAL_TABLET | ORAL | 1 refills | Status: DC
  Filled 2021-01-13: qty 90, 90d supply, fill #0

## 2021-02-20 ENCOUNTER — Other Ambulatory Visit: Payer: Self-pay | Admitting: Registered Nurse

## 2021-02-20 DIAGNOSIS — R1115 Cyclical vomiting syndrome unrelated to migraine: Secondary | ICD-10-CM

## 2021-02-20 DIAGNOSIS — F411 Generalized anxiety disorder: Secondary | ICD-10-CM

## 2021-02-21 ENCOUNTER — Other Ambulatory Visit (HOSPITAL_COMMUNITY): Payer: Self-pay

## 2021-02-21 ENCOUNTER — Other Ambulatory Visit: Payer: Self-pay | Admitting: Registered Nurse

## 2021-02-21 DIAGNOSIS — R1115 Cyclical vomiting syndrome unrelated to migraine: Secondary | ICD-10-CM

## 2021-02-21 DIAGNOSIS — F411 Generalized anxiety disorder: Secondary | ICD-10-CM

## 2021-02-21 MED ORDER — FLUOXETINE HCL 40 MG PO CAPS
40.0000 mg | ORAL_CAPSULE | Freq: Every day | ORAL | 3 refills | Status: DC
Start: 2021-02-21 — End: 2021-04-27

## 2021-02-21 MED ORDER — QUETIAPINE FUMARATE 100 MG PO TABS: ORAL_TABLET | ORAL | 3 refills | Status: DC

## 2021-03-03 ENCOUNTER — Other Ambulatory Visit (HOSPITAL_COMMUNITY): Payer: Self-pay

## 2021-03-03 ENCOUNTER — Encounter: Payer: Self-pay | Admitting: Registered Nurse

## 2021-03-03 ENCOUNTER — Ambulatory Visit (INDEPENDENT_AMBULATORY_CARE_PROVIDER_SITE_OTHER): Payer: No Typology Code available for payment source | Admitting: Registered Nurse

## 2021-03-03 VITALS — BP 144/86 | HR 104 | Temp 98.2°F | Resp 16 | Ht 65.0 in | Wt 205.8 lb

## 2021-03-03 DIAGNOSIS — R1115 Cyclical vomiting syndrome unrelated to migraine: Secondary | ICD-10-CM

## 2021-03-03 DIAGNOSIS — I10 Essential (primary) hypertension: Secondary | ICD-10-CM | POA: Diagnosis not present

## 2021-03-03 MED ORDER — TELMISARTAN 20 MG PO TABS
20.0000 mg | ORAL_TABLET | Freq: Every day | ORAL | 1 refills | Status: DC
Start: 2021-03-03 — End: 2021-04-27
  Filled 2021-03-03: qty 90, 90d supply, fill #0

## 2021-03-03 MED ORDER — PROMETHAZINE HCL 12.5 MG PO TABS
12.5000 mg | ORAL_TABLET | Freq: Three times a day (TID) | ORAL | 1 refills | Status: DC | PRN
  Filled 2021-03-03: qty 30, 10d supply, fill #0

## 2021-03-03 MED ORDER — ONDANSETRON 8 MG PO TBDP
8.0000 mg | ORAL_TABLET | Freq: Three times a day (TID) | ORAL | 0 refills | Status: DC | PRN
  Filled 2021-03-03: qty 30, 10d supply, fill #0

## 2021-03-03 NOTE — Patient Instructions (Addendum)
Diana Thomas -   [name] to see you!  Start telmisartan 20mg  daily.  BP check in 3-4 weeks. Bring home cuff with you  See you within 6 mo to recheck  Thanks,  Rich

## 2021-03-03 NOTE — Progress Notes (Signed)
Established Patient Office Visit  Subjective:  Patient ID: Diana Thomas, female    DOB: 09/01/1995  Age: [age] y.oLowella Thomas. MRN: [id number]  CC:  Chief Complaint  Patient presents with   Hypertension    Pt reports 150/90 home readings consistently, denies physical sxs, been taking bp since apx Christmas elevated pule as well     HPI Diana BandyKierra A Thomas presents for elevated bp without past dx of htn  Has home bp cuff and notes 140s/90s at home. Pulse running high as well with 100s-120s. Denies CV symptoms including: chest pain, shob, doe, headache, visual changes, fatigue, claudication, and dependent edema.   Previous readings and labs: BP Readings from Last 3 Encounters:  03/03/21 (!) 144/86  12/30/20 130/80  10/29/20 140/89   Lab Results  Component Value Date   CREATININE 0.86 10/29/2020    Does need refill on ondansetron and promethazine. Prn use for nvd.  Past Medical History:  Diagnosis Date   Allergy    Anemia    Anxiety    Cannabinoid hyperemesis syndrome    Depression    Esophagitis    Gastritis    GERD (gastroesophageal reflux disease)    Heart murmur     Past Surgical History:  Procedure Laterality Date   BIOPSY  07/23/2020   Procedure: BIOPSY;  Surgeon: Beverley FiedlerPyrtle, Jay M, MD;  Location: [facility];  Service: Gastroenterology;;   ESOPHAGOGASTRODUODENOSCOPY (EGD) WITH PROPOFOL N/A 07/23/2020   Procedure: ESOPHAGOGASTRODUODENOSCOPY (EGD) WITH PROPOFOL;  Surgeon: Beverley FiedlerPyrtle, Jay M, MD;  Location: [facility];  Service: Gastroenterology;  Laterality: N/A;   NO PAST SURGERIES      Family History  Problem Relation Age of Onset   Heart disease Mother    Hypertension Mother    Hyperlipidemia Mother    ADD / ADHD Father    Healthy Father    Healthy Sister    Liver disease Maternal Grandmother    Diabetes Maternal Uncle    Colon cancer Neg Hx    Esophageal cancer Neg Hx    Stomach cancer Neg Hx    Rectal cancer Neg Hx    Pancreatic cancer Neg Hx     Social History    Socioeconomic History   Marital status: Single    Spouse name: Not on file   Number of children: 0   Years of education: Not on file   Highest education level: Not on file  Occupational History   Occupation: RN  Tobacco Use   Smoking status: Never   Smokeless tobacco: Never  Vaping Use   Vaping Use: Never used  Substance and Sexual Activity   Alcohol use: Not Currently   Drug use: Yes    Types: Marijuana    Comment: daily   Sexual activity: Yes  Other Topics Concern   Not on file  Social History Narrative   Not on file   Social Determinants of Health   Financial Resource Strain: Not on file  Food Insecurity: Not on file  Transportation Needs: Not on file  Physical Activity: Not on file  Stress: Not on file  Social Connections: Not on file  Intimate Partner Violence: Not on file    Outpatient Medications Prior to Visit  Medication Sig Dispense Refill   amitriptyline (ELAVIL) 50 MG tablet TAKE 1 TABLET BY MOUTH DAILY 90 tablet 1   FLUoxetine (PROZAC) 40 MG capsule Take 1 capsule (40 mg total) by mouth daily. 90 capsule 3   medroxyPROGESTERone (DEPO-PROVERA) 150 MG/ML injection Inject 1 mL (  150 mg total) into the muscle every 3 (three) months. 1 mL 2   pantoprazole (PROTONIX) 40 MG tablet TAKE 1 TABLET BY MOUTH EVERY DAY 30 tablet 1   QUEtiapine (SEROQUEL) 100 MG tablet TAKE 1 TABLET BY MOUTH NIGHTLY AT BEDTIME 90 tablet 3   SUMAtriptan (IMITREX) 20 MG/ACT nasal spray PLACE 1 SPRAY (20 MG TOTAL) INTO THE NOSE EVERY 2 (TWO) HOURS AS NEEDED FOR MIGRAINE OR HEADACHE. MAY REPEAT IN 2 HOURS IF ABDOMINAL PAIN/VOMITING PERSISTS OR RECURS. DO NOT TAKE MORE THAN 2 DOSES IN A DAY AND 6 DOSES IN A WEEK 1 each 0   ondansetron (ZOFRAN ODT) 8 MG disintegrating tablet Take 1 tablet (8 mg total) by mouth every 8 (eight) hours as needed for nausea or vomiting. 30 tablet 0   promethazine (PHENERGAN) 12.5 MG tablet Take 1 tablet (12.5 mg total) by mouth every 8 (eight) hours as needed for  nausea or vomiting. 30 tablet 1   ALPRAZolam (NIRAVAM) 0.5 MG dissolvable tablet Take 1 tablet (0.5 mg total) by mouth 2 (two) times daily as needed for anxiety. (Patient not taking: Reported on 03/03/2021) 10 tablet 0   capsaicin (ZOSTRIX) 0.025 % cream Apply topically 2 (two) times daily. (Patient not taking: Reported on 12/30/2020) 60 g 0   famotidine (PEPCID) 10 MG tablet Take 1 tablet (10 mg total) by mouth 2 (two) times daily. 60 tablet 0   No facility-administered medications prior to visit.    No Known Allergies  ROS Review of Systems  Constitutional: Negative.   HENT: Negative.    Eyes: Negative.   Respiratory: Negative.    Cardiovascular: Negative.   Gastrointestinal: Negative.   Genitourinary: Negative.   Musculoskeletal: Negative.   Skin: Negative.   Neurological: Negative.   Psychiatric/Behavioral: Negative.    All other systems reviewed and are negative.    Objective:    Physical Exam Vitals and nursing note reviewed.  Constitutional:      General: She is not in acute distress.    Appearance: Normal appearance. She is normal weight. She is not ill-appearing, toxic-appearing or diaphoretic.  Cardiovascular:     Rate and Rhythm: Normal rate and regular rhythm.     Heart sounds: Normal heart sounds. No murmur heard.   No friction rub. No gallop.  Pulmonary:     Effort: Pulmonary effort is normal. No respiratory distress.     Breath sounds: Normal breath sounds. No stridor. No wheezing, rhonchi or rales.  Chest:     Chest wall: No tenderness.  Skin:    General: Skin is warm and dry.  Neurological:     General: No focal deficit present.     Mental Status: She is alert and oriented to person, place, and time. Mental status is at baseline.  Psychiatric:        Mood and Affect: Mood normal.        Behavior: Behavior normal.        Thought Content: Thought content normal.        Judgment: Judgment normal.    BP (!) 144/86    Pulse (!) 104    Temp 98.2 F  (36.8 C) (Temporal)    Resp 16    Ht 5\' 5"  (1.651 m)    Wt 205 lb 12.8 oz (93.4 kg)    SpO2 96%    BMI 34.25 kg/m  Wt Readings from Last 3 Encounters:  03/03/21 205 lb 12.8 oz (93.4 kg)  12/30/20 196 lb 3.2 oz (89  kg)  10/29/20 195 lb 12.8 oz (88.8 kg)     Health Maintenance Due  Topic Date Due   Pneumococcal Vaccine 37-68 Years old (1 - PCV) Never done   COVID-19 Vaccine (3 - Mixed Product risk series) 08/29/2019   PAP-Cervical Cytology Screening  09/16/2020   PAP SMEAR-Modifier  09/16/2020    There are no preventive care reminders to display for this patient.  Lab Results  Component Value Date   TSH 0.575 09/16/2017   Lab Results  Component Value Date   WBC 14.2 (H) 10/29/2020   HGB 14.4 10/29/2020   HCT 44.0 10/29/2020   MCV 84.6 10/29/2020   PLT 373 10/29/2020   Lab Results  Component Value Date   NA 137 10/29/2020   K 3.3 (L) 10/29/2020   CO2 28 10/29/2020   GLUCOSE 121 (H) 10/29/2020   BUN 13 10/29/2020   CREATININE 0.86 10/29/2020   BILITOT 0.4 10/29/2020   ALKPHOS 64 10/29/2020   AST 19 10/29/2020   ALT 20 10/29/2020   PROT 8.7 (H) 10/29/2020   ALBUMIN 5.0 10/29/2020   CALCIUM 10.1 10/29/2020   ANIONGAP 13 10/29/2020   No results found for: CHOL No results found for: HDL No results found for: LDLCALC No results found for: TRIG No results found for: CHOLHDL Lab Results  Component Value Date   HGBA1C 5.6 07/20/2020      Assessment & Plan:   Problem List Items Addressed This Visit   None Visit Diagnoses     Elevated blood pressure reading in office with diagnosis of hypertension    -  Primary   Relevant Medications   telmisartan (MICARDIS) 20 MG tablet   Cyclic vomiting syndrome       Relevant Medications   ondansetron (ZOFRAN-ODT) 8 MG disintegrating tablet   promethazine (PHENERGAN) 12.5 MG tablet       Meds ordered this encounter  Medications   telmisartan (MICARDIS) 20 MG tablet    Sig: Take 1 tablet (20 mg total) by mouth  daily.    Dispense:  90 tablet    Refill:  1    Order Specific Question:   Supervising Provider    Answer:   Neva Seat, JEFFREY R [[id number]]   ondansetron (ZOFRAN-ODT) 8 MG disintegrating tablet    Sig: Take 1 tablet (8 mg total) by mouth every 8 (eight) hours as needed for nausea or vomiting.    Dispense:  30 tablet    Refill:  0    Order Specific Question:   Supervising Provider    Answer:   Neva Seat, JEFFREY R [[id number]]   promethazine (PHENERGAN) 12.5 MG tablet    Sig: Take 1 tablet (12.5 mg total) by mouth every 8 (eight) hours as needed for nausea or vomiting.    Dispense:  30 tablet    Refill:  1    Order Specific Question:   Supervising Provider    Answer:   Neva Seat, JEFFREY R [[id number]]    Follow-up: Return in about 6 months (around 08/31/2021) for 3-4 week bp check, 6 mo w provider.Marland Kitchen   PLAN Exam unremarkable Reviewed last labs, no findings clinically relevant to today's visit. Start telmisartan 20mg  po qd. Bnp check in 3-4 weeks. Return for visit with provider in 6 mo Refill zofran and phenergan. Patient encouraged to call clinic with any questions, comments, or concerns.  , NP

## 2021-03-04 ENCOUNTER — Encounter: Payer: Self-pay | Admitting: Gastroenterology

## 2021-03-24 ENCOUNTER — Ambulatory Visit: Payer: No Typology Code available for payment source

## 2021-04-27 ENCOUNTER — Encounter: Payer: Self-pay | Admitting: Registered Nurse

## 2021-04-27 ENCOUNTER — Ambulatory Visit (INDEPENDENT_AMBULATORY_CARE_PROVIDER_SITE_OTHER): Payer: No Typology Code available for payment source | Admitting: Registered Nurse

## 2021-04-27 ENCOUNTER — Other Ambulatory Visit (HOSPITAL_COMMUNITY): Payer: Self-pay

## 2021-04-27 DIAGNOSIS — R1115 Cyclical vomiting syndrome unrelated to migraine: Secondary | ICD-10-CM

## 2021-04-27 DIAGNOSIS — I10 Essential (primary) hypertension: Secondary | ICD-10-CM

## 2021-04-27 DIAGNOSIS — F411 Generalized anxiety disorder: Secondary | ICD-10-CM

## 2021-04-27 MED ORDER — FLUOXETINE HCL 40 MG PO CAPS
40.0000 mg | ORAL_CAPSULE | Freq: Every day | ORAL | 3 refills | Status: DC
  Filled 2021-04-27: qty 90, 90d supply, fill #0
  Filled 2021-08-29: qty 90, 90d supply, fill #1
  Filled 2021-12-26: qty 90, 90d supply, fill #2

## 2021-04-27 MED ORDER — FAMOTIDINE 20 MG PO TABS
10.0000 mg | ORAL_TABLET | Freq: Two times a day (BID) | ORAL | 0 refills | Status: DC
  Filled 2021-04-27: qty 30, 30d supply, fill #0

## 2021-04-27 MED ORDER — PANTOPRAZOLE SODIUM 40 MG PO TBEC
40.0000 mg | DELAYED_RELEASE_TABLET | Freq: Every day | ORAL | 3 refills | Status: DC
  Filled 2021-04-27: qty 90, 90d supply, fill #0
  Filled 2021-08-29: qty 90, 90d supply, fill #1
  Filled 2021-12-26: qty 90, 90d supply, fill #2

## 2021-04-27 MED ORDER — ONDANSETRON 8 MG PO TBDP
8.0000 mg | ORAL_TABLET | Freq: Three times a day (TID) | ORAL | 3 refills | Status: DC | PRN
  Filled 2021-04-27: qty 30, 10d supply, fill #0
  Filled 2021-08-29: qty 30, 10d supply, fill #1

## 2021-04-27 MED ORDER — AMITRIPTYLINE HCL 50 MG PO TABS
ORAL_TABLET | ORAL | 1 refills | Status: DC
  Filled 2021-04-27: qty 90, 90d supply, fill #0
  Filled 2021-08-29: qty 90, 90d supply, fill #1

## 2021-04-27 MED ORDER — SUMATRIPTAN 20 MG/ACT NA SOLN
20.0000 mg | NASAL | 6 refills | Status: DC | PRN
  Filled 2021-04-27: qty 6, 30d supply, fill #0
  Filled 2021-05-20: qty 6, 15d supply, fill #0

## 2021-04-27 MED ORDER — TELMISARTAN 20 MG PO TABS
20.0000 mg | ORAL_TABLET | Freq: Every day | ORAL | 1 refills | Status: DC
  Filled 2021-04-27 – 2021-06-09 (×2): qty 90, 90d supply, fill #0
  Filled 2021-11-08: qty 90, 90d supply, fill #1

## 2021-04-27 MED ORDER — PROMETHAZINE HCL 12.5 MG PO TABS
12.5000 mg | ORAL_TABLET | Freq: Three times a day (TID) | ORAL | 1 refills | Status: DC | PRN
  Filled 2021-04-27: qty 30, 10d supply, fill #0
  Filled 2021-08-29: qty 30, 10d supply, fill #1

## 2021-04-27 MED ORDER — QUETIAPINE FUMARATE 100 MG PO TABS
ORAL_TABLET | ORAL | 3 refills | Status: DC
  Filled 2021-04-27: qty 90, 90d supply, fill #0
  Filled 2021-08-29: qty 90, 90d supply, fill #1
  Filled 2021-12-26: qty 90, 90d supply, fill #2

## 2021-04-27 NOTE — Progress Notes (Signed)
? ?Established Patient Office Visit ? ?Subjective:  ?Patient ID: Diana Thomas, female    DOB: 08-28-1995  Age: 26 y.o. MRN: [id number] ? ?CC:  ?Chief Complaint  ?Patient presents with  ? Medication Refill  ?  Patient states she is here for medication refills and discuss her BP. Patient states they have been elevated at home ?  ? ? ?HPI ?Diana Thomas presents for med refill, BP ? ?High BP ?Home BP has been elevated. ?In office readings stable. ?Without meds is 150s systolic. ?Reading today wnl ?No CV symptoms ? ?Anxiety, depression, cyclic vomiting ?On prozac, seroquel, sumatriptan nasal spray, elavil. ?Good effect, no AE ?Has phenergan and zofran available prn ?No recent episodes.  ?No other concerns. ?Uses pantoprazole and famotidine for GERD ?No AE ?Hopes to continue ? ? ? ?Past Medical History:  ?Diagnosis Date  ? Allergy   ? Anemia   ? Anxiety   ? Cannabinoid hyperemesis syndrome   ? Depression   ? Esophagitis   ? Gastritis   ? GERD (gastroesophageal reflux disease)   ? Heart murmur   ? ? ?Past Surgical History:  ?Procedure Laterality Date  ? BIOPSY  07/23/2020  ? Procedure: BIOPSY;  Surgeon: Beverley Fiedler, MD;  Location: [facility];  Service: Gastroenterology;;  ? ESOPHAGOGASTRODUODENOSCOPY (EGD) WITH PROPOFOL N/A 07/23/2020  ? Procedure: ESOPHAGOGASTRODUODENOSCOPY (EGD) WITH PROPOFOL;  Surgeon: Beverley Fiedler, MD;  Location: [facility];  Service: Gastroenterology;  Laterality: N/A;  ? NO PAST SURGERIES    ? ? ?Family History  ?Problem Relation Age of Onset  ? Heart disease Mother   ? Hypertension Mother   ? Hyperlipidemia Mother   ? ADD / ADHD Father   ? Healthy Father   ? Healthy Sister   ? Liver disease Maternal Grandmother   ? Diabetes Maternal Uncle   ? Colon cancer Neg Hx   ? Esophageal cancer Neg Hx   ? Stomach cancer Neg Hx   ? Rectal cancer Neg Hx   ? Pancreatic cancer Neg Hx   ? ? ?Social History  ? ?Socioeconomic History  ? Marital status: Single  ?  Spouse name: Not on file  ? Number of  children: 0  ? Years of education: Not on file  ? Highest education level: Not on file  ?Occupational History  ? Occupation: Charity fundraiser  ?Tobacco Use  ? Smoking status: Never  ? Smokeless tobacco: Never  ?Vaping Use  ? Vaping Use: Never used  ?Substance and Sexual Activity  ? Alcohol use: Not Currently  ? Drug use: Yes  ?  Types: Marijuana  ?  Comment: daily  ? Sexual activity: Yes  ?Other Topics Concern  ? Not on file  ?Social History Narrative  ? Not on file  ? ?Social Determinants of Health  ? ?Financial Resource Strain: Not on file  ?Food Insecurity: Not on file  ?Transportation Needs: Not on file  ?Physical Activity: Not on file  ?Stress: Not on file  ?Social Connections: Not on file  ?Intimate Partner Violence: Not on file  ? ? ?Outpatient Medications Prior to Visit  ?Medication Sig Dispense Refill  ? amitriptyline (ELAVIL) 50 MG tablet TAKE 1 TABLET BY MOUTH DAILY 90 tablet 1  ? FLUoxetine (PROZAC) 40 MG capsule Take 1 capsule (40 mg total) by mouth daily. 90 capsule 3  ? medroxyPROGESTERone (DEPO-PROVERA) 150 MG/ML injection Inject 1 mL (150 mg total) into the muscle every 3 (three) months. 1 mL 2  ? ondansetron (ZOFRAN-ODT) 8 MG  disintegrating tablet Dissolve 1 tablet by mouth every 8 hours as needed for nausea or vomiting. 30 tablet 0  ? pantoprazole (PROTONIX) 40 MG tablet TAKE 1 TABLET BY MOUTH EVERY DAY 30 tablet 1  ? promethazine (PHENERGAN) 12.5 MG tablet Take 1 tablet by mouth every 8 hours as needed for nausea or vomiting. 30 tablet 1  ? QUEtiapine (SEROQUEL) 100 MG tablet TAKE 1 TABLET BY MOUTH NIGHTLY AT BEDTIME 90 tablet 3  ? SUMAtriptan (IMITREX) 20 MG/ACT nasal spray PLACE 1 SPRAY (20 MG TOTAL) INTO THE NOSE EVERY 2 (TWO) HOURS AS NEEDED FOR MIGRAINE OR HEADACHE. MAY REPEAT IN 2 HOURS IF ABDOMINAL PAIN/VOMITING PERSISTS OR RECURS. DO NOT TAKE MORE THAN 2 DOSES IN A DAY AND 6 DOSES IN A WEEK 1 each 0  ? telmisartan (MICARDIS) 20 MG tablet Take 1 tablet by mouth daily. 90 tablet 1  ? ALPRAZolam (NIRAVAM)  0.5 MG dissolvable tablet Take 1 tablet (0.5 mg total) by mouth 2 (two) times daily as needed for anxiety. (Patient not taking: Reported on 03/03/2021) 10 tablet 0  ? capsaicin (ZOSTRIX) 0.025 % cream Apply topically 2 (two) times daily. (Patient not taking: Reported on 12/30/2020) 60 g 0  ? famotidine (PEPCID) 10 MG tablet Take 1 tablet (10 mg total) by mouth 2 (two) times daily. 60 tablet 0  ? ?No facility-administered medications prior to visit.  ? ? ?No Known Allergies ? ?ROS ?Review of Systems  ?Constitutional: Negative.   ?HENT: Negative.    ?Eyes: Negative.   ?Respiratory: Negative.    ?Cardiovascular: Negative.   ?Gastrointestinal: Negative.   ?Genitourinary: Negative.   ?Musculoskeletal: Negative.   ?Skin: Negative.   ?Neurological: Negative.   ?Psychiatric/Behavioral: Negative.    ?All other systems reviewed and are negative. ? ?  ?Objective:  ?  ?Physical Exam ?Vitals and nursing note reviewed.  ?Constitutional:   ?   General: She is not in acute distress. ?   Appearance: Normal appearance. She is normal weight. She is not ill-appearing, toxic-appearing or diaphoretic.  ?Cardiovascular:  ?   Rate and Rhythm: Normal rate and regular rhythm.  ?   Heart sounds: Normal heart sounds. No murmur heard. ?  No friction rub. No gallop.  ?Pulmonary:  ?   Effort: Pulmonary effort is normal. No respiratory distress.  ?   Breath sounds: Normal breath sounds. No stridor. No wheezing, rhonchi or rales.  ?Chest:  ?   Chest wall: No tenderness.  ?Skin: ?   General: Skin is warm and dry.  ?Neurological:  ?   General: No focal deficit present.  ?   Mental Status: She is alert and oriented to person, place, and time. Mental status is at baseline.  ?Psychiatric:     ?   Mood and Affect: Mood normal.     ?   Behavior: Behavior normal.     ?   Thought Content: Thought content normal.     ?   Judgment: Judgment normal.  ? ? ?BP 131/77   Pulse (!) 119   Temp 97.8 ?F (36.6 ?C) (Temporal)   Resp 18   Ht 5\' 5"  (1.651 m)   Wt 205  lb 12.8 oz (93.4 kg)   SpO2 100%   BMI 34.25 kg/m?  ?Wt Readings from Last 3 Encounters:  ?04/27/21 205 lb 12.8 oz (93.4 kg)  ?03/03/21 205 lb 12.8 oz (93.4 kg)  ?12/30/20 196 lb 3.2 oz (89 kg)  ? ? ? ?Health Maintenance Due  ?Topic Date Due  ?  COVID-19 Vaccine (3 - Mixed Product risk series) 08/29/2019  ? PAP-Cervical Cytology Screening  09/16/2020  ? PAP SMEAR-Modifier  09/16/2020  ? ? ?There are no preventive care reminders to display for this patient. ? ?Lab Results  ?Component Value Date  ? TSH 0.575 09/16/2017  ? ?Lab Results  ?Component Value Date  ? WBC 14.2 (H) 10/29/2020  ? HGB 14.4 10/29/2020  ? HCT 44.0 10/29/2020  ? MCV 84.6 10/29/2020  ? PLT 373 10/29/2020  ? ?Lab Results  ?Component Value Date  ? NA 137 10/29/2020  ? K 3.3 (L) 10/29/2020  ? CO2 28 10/29/2020  ? GLUCOSE 121 (H) 10/29/2020  ? BUN 13 10/29/2020  ? CREATININE 0.86 10/29/2020  ? BILITOT 0.4 10/29/2020  ? ALKPHOS 64 10/29/2020  ? AST 19 10/29/2020  ? ALT 20 10/29/2020  ? PROT 8.7 (H) 10/29/2020  ? ALBUMIN 5.0 10/29/2020  ? CALCIUM 10.1 10/29/2020  ? ANIONGAP 13 10/29/2020  ? ?No results found for: CHOL ?No results found for: HDL ?No results found for: LDLCALC ?No results found for: TRIG ?No results found for: CHOLHDL ?Lab Results  ?Component Value Date  ? HGBA1C 5.6 07/20/2020  ? ? ?  ?Assessment & Plan:  ? ?Problem List Items Addressed This Visit   ?None ? ? ?No orders of the defined types were placed in this encounter. ? ? ?Follow-up: No follow-ups on file.  ? ? ?Janeece Ageeichard Kaynen Minner, NP ?

## 2021-04-27 NOTE — Patient Instructions (Signed)
Ms. Rupert -  ? ?Great to see you ? ?Call with concerns ? ?See you in 6 mo for a physical and labs ? ?Thank you, ? ?Rich  ?

## 2021-04-28 ENCOUNTER — Other Ambulatory Visit (HOSPITAL_COMMUNITY): Payer: Self-pay

## 2021-05-06 ENCOUNTER — Other Ambulatory Visit (HOSPITAL_COMMUNITY): Payer: Self-pay

## 2021-05-20 ENCOUNTER — Other Ambulatory Visit (HOSPITAL_COMMUNITY): Payer: Self-pay

## 2021-06-09 ENCOUNTER — Other Ambulatory Visit (HOSPITAL_COMMUNITY): Payer: Self-pay

## 2021-06-12 ENCOUNTER — Other Ambulatory Visit (HOSPITAL_COMMUNITY): Payer: Self-pay

## 2021-06-16 ENCOUNTER — Other Ambulatory Visit (HOSPITAL_COMMUNITY): Payer: Self-pay

## 2021-07-13 IMAGING — US US OB TRANSVAGINAL
1 series · 15 of 28 positions shown · non-contrast
Comparison: None.

CLINICAL DATA: Inconclusive viability

EXAM:
OBSTETRIC <14 WK US AND TRANSVAGINAL OB US
TECHNIQUE: Both transabdominal and transvaginal ultrasound examinations were
performed for complete evaluation of the gestation as well as the
maternal uterus, adnexal regions, and pelvic cul-de-sac.
Transvaginal technique was performed to assess early pregnancy.

[Series 1: us ob transvaginal · 31 acquisitions, 15 frames shown]
[im 1/31]
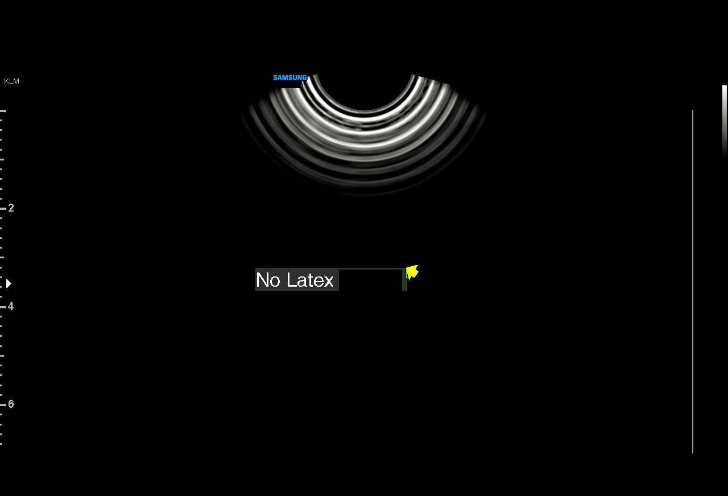
[im 3/31]
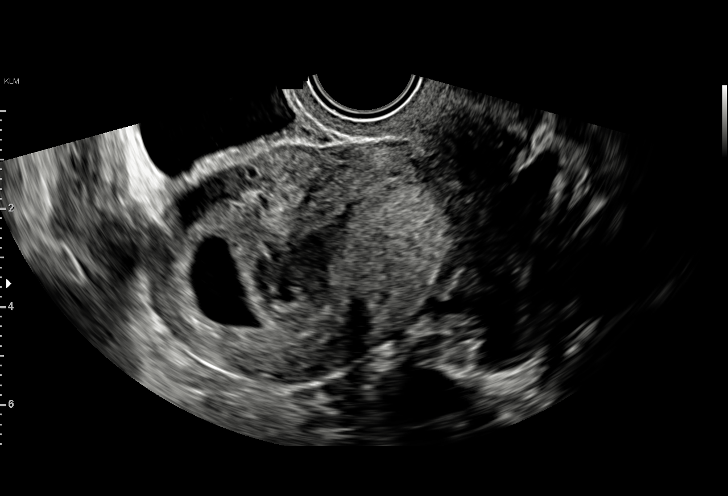
[im 5/31]
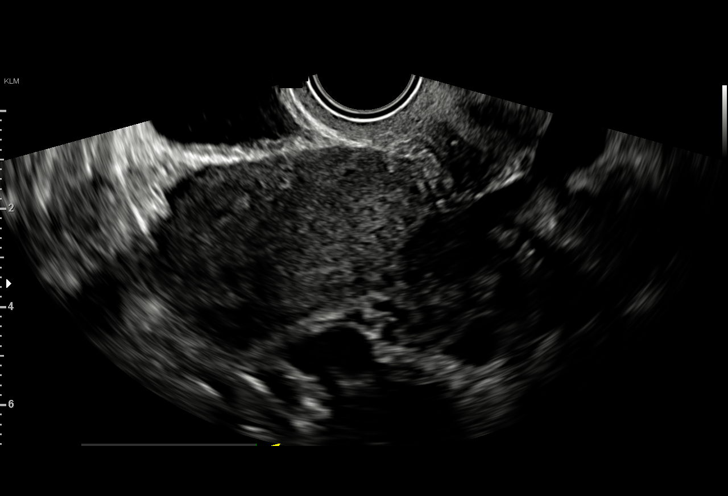
[im 7/31]
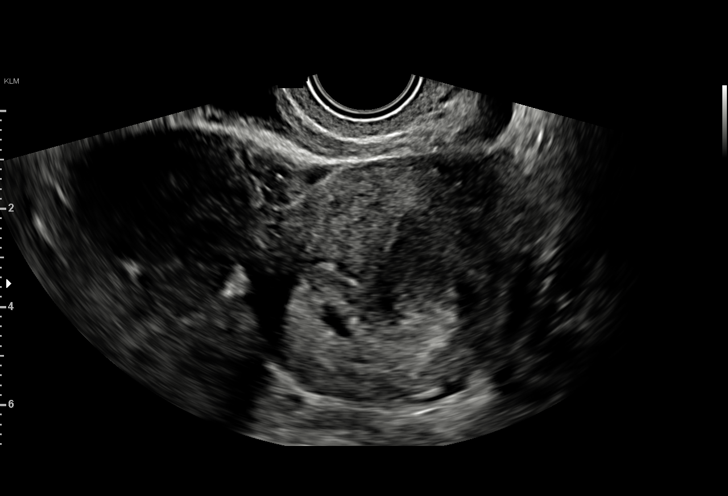
[im 9/31]
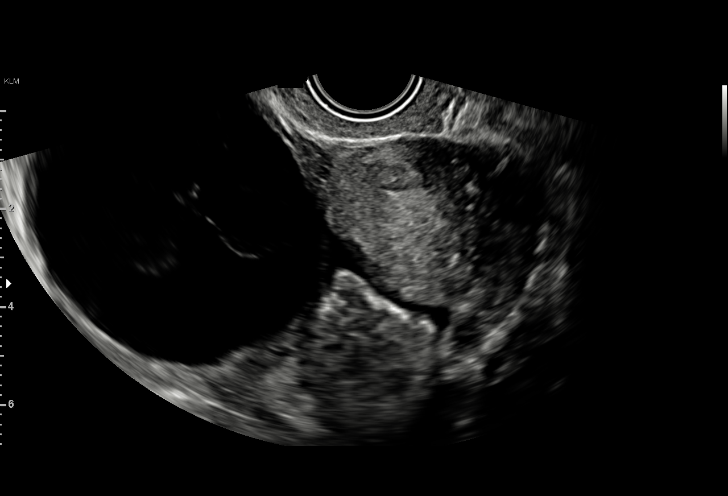
[im 12/31]
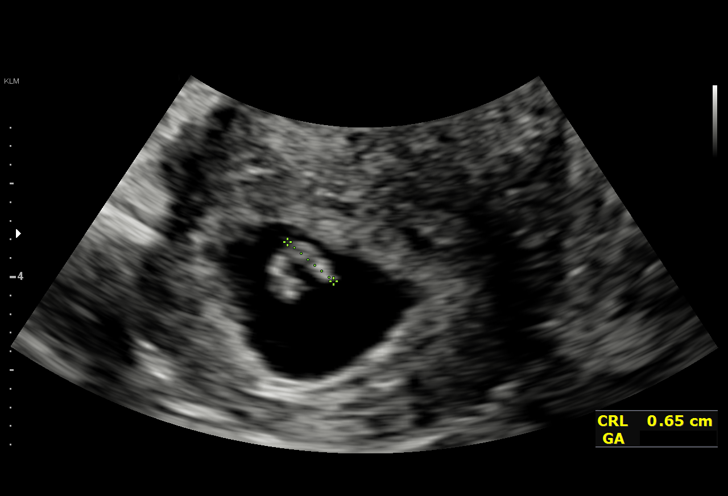
[im 14/31]
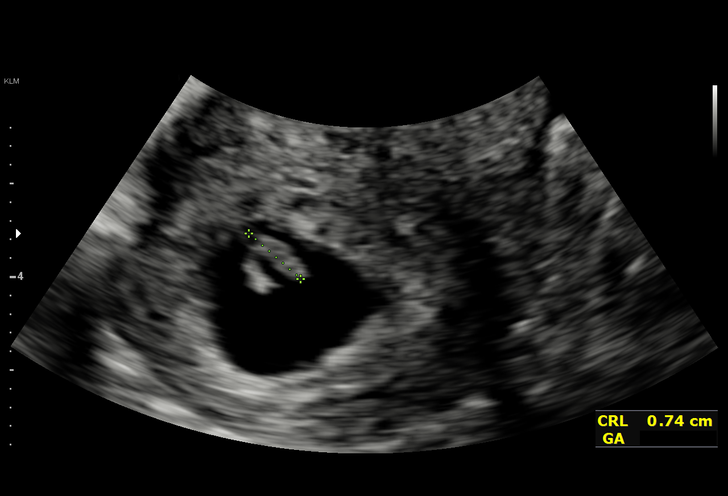
[im 16/31]
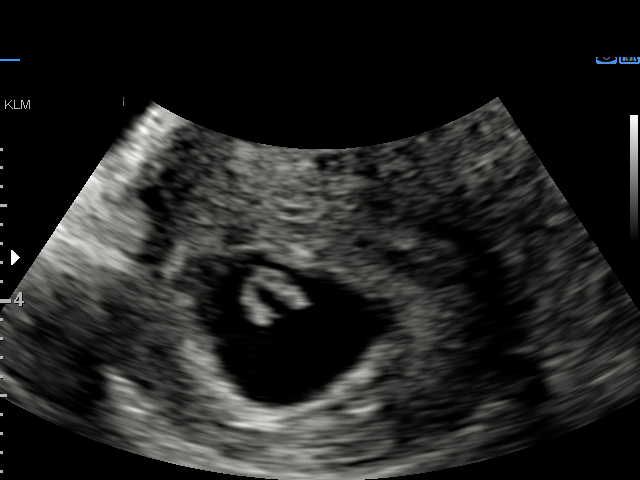
[im 17/31]
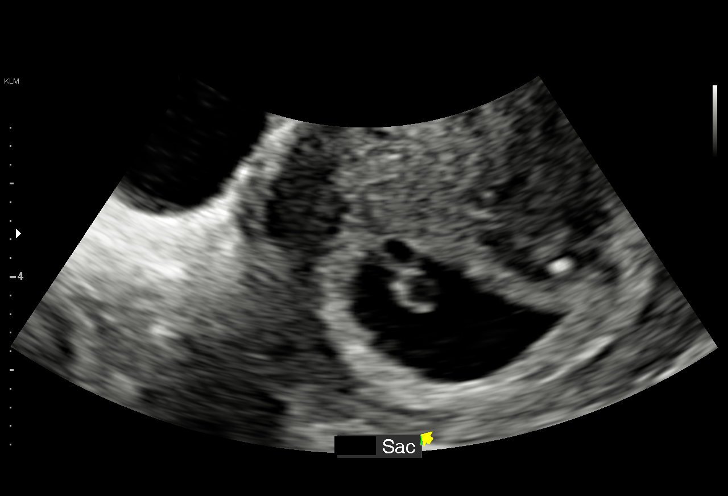
[im 19/31]
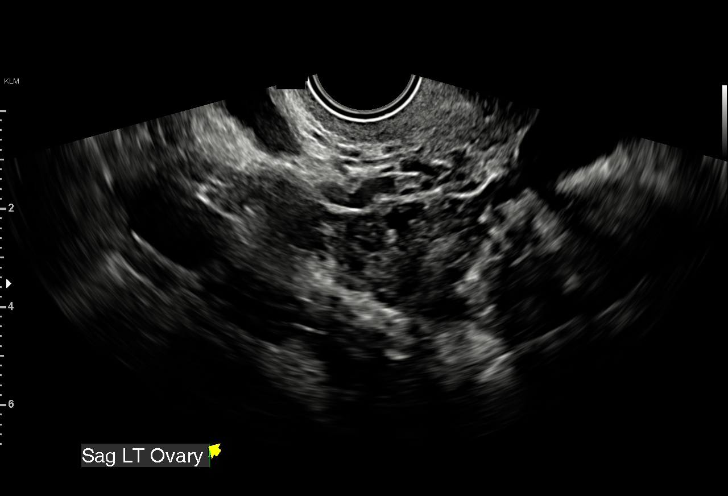
[im 22/31]
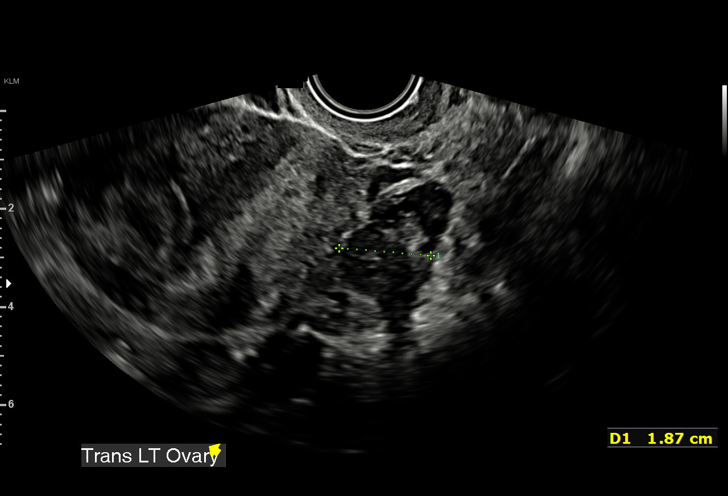
[im 24/31]
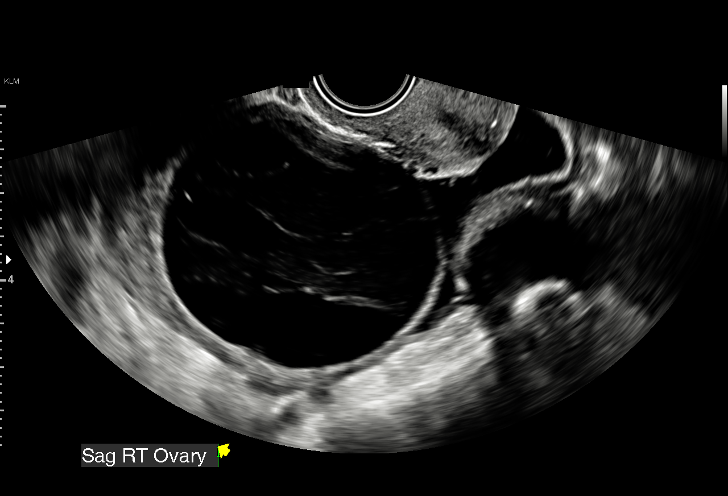
[im 26/31]
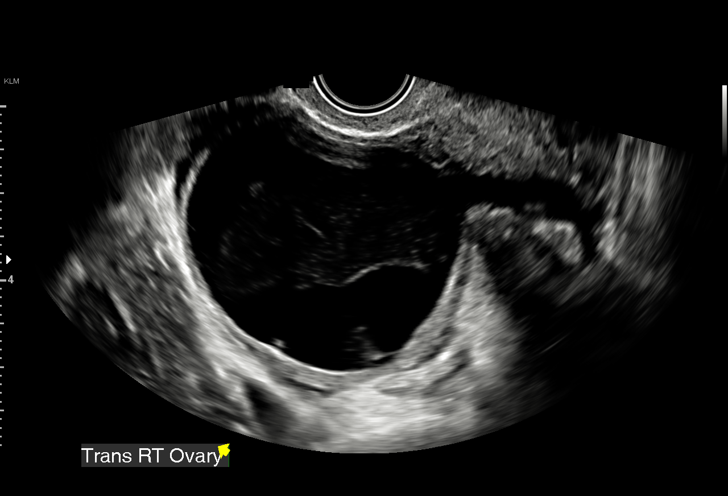
[im 28/31]
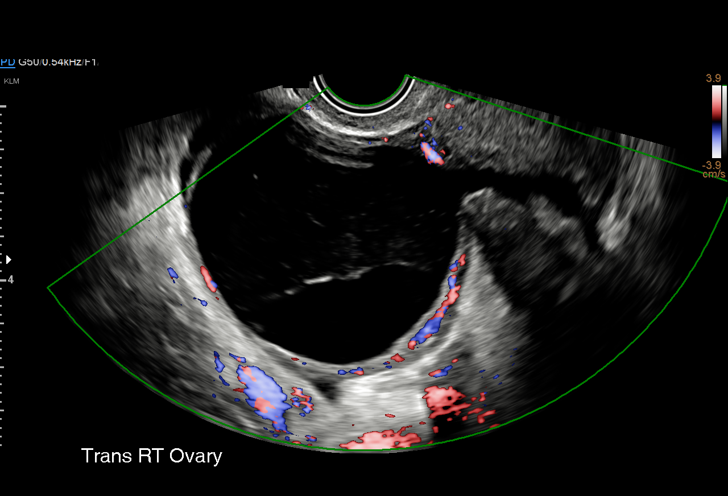
[im 31/31]
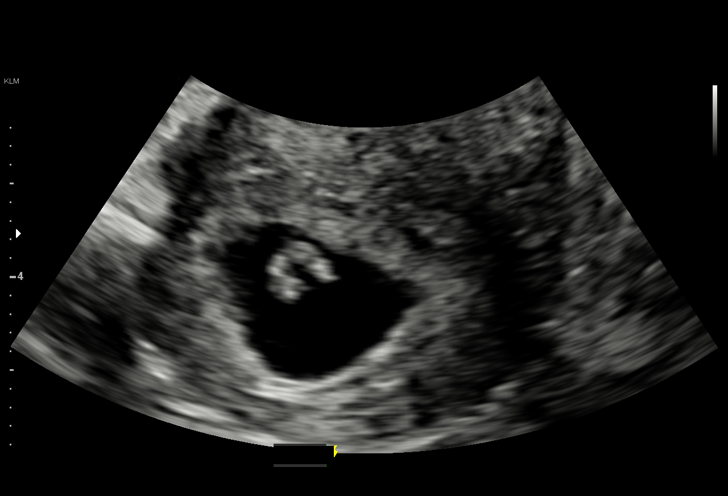

[15 of 28 positions shown; findings below may reference images not displayed]

FINDINGS: Intrauterine gestational sac: Single

Yolk sac:  Visualized.

Embryo:  Visualized.

Cardiac Activity: Visualized.

Heart Rate: 123 bpm

CRL:  6.9 mm   6 w   3 d                  US EDC: 08/03/2020

Subchorionic hemorrhage:  None visualized.

Maternal uterus/adnexae: Again noted within the right ovary is a
complex hemorrhagic cyst measuring 5.0 x 6.3 x 6.1 cm. The left
ovary is normal in appearance.
IMPRESSION: Single live intrauterine pregnancy measuring 6 weeks 3 days

Slight interval growth in the complex hemorrhagic cyst within the
right ovary.

## 2021-09-03 ENCOUNTER — Other Ambulatory Visit: Payer: Self-pay | Admitting: Registered Nurse

## 2021-09-03 DIAGNOSIS — R1115 Cyclical vomiting syndrome unrelated to migraine: Secondary | ICD-10-CM

## 2021-09-03 DIAGNOSIS — I10 Essential (primary) hypertension: Secondary | ICD-10-CM

## 2021-09-03 DIAGNOSIS — F411 Generalized anxiety disorder: Secondary | ICD-10-CM

## 2021-09-04 ENCOUNTER — Other Ambulatory Visit (HOSPITAL_COMMUNITY): Payer: Self-pay

## 2021-09-04 MED ORDER — FAMOTIDINE 20 MG PO TABS
10.0000 mg | ORAL_TABLET | Freq: Two times a day (BID) | ORAL | 0 refills | Status: DC
  Filled 2021-09-04: qty 30, 30d supply, fill #0

## 2021-09-07 ENCOUNTER — Other Ambulatory Visit (HOSPITAL_COMMUNITY): Payer: Self-pay

## 2021-09-09 ENCOUNTER — Other Ambulatory Visit (HOSPITAL_COMMUNITY): Payer: Self-pay

## 2021-09-10 ENCOUNTER — Other Ambulatory Visit (HOSPITAL_COMMUNITY): Payer: Self-pay

## 2021-10-26 ENCOUNTER — Encounter: Payer: No Typology Code available for payment source | Admitting: Registered Nurse

## 2021-11-08 ENCOUNTER — Other Ambulatory Visit: Payer: Self-pay | Admitting: Registered Nurse

## 2021-11-08 DIAGNOSIS — F411 Generalized anxiety disorder: Secondary | ICD-10-CM

## 2021-11-08 DIAGNOSIS — R1115 Cyclical vomiting syndrome unrelated to migraine: Secondary | ICD-10-CM

## 2021-11-08 DIAGNOSIS — I10 Essential (primary) hypertension: Secondary | ICD-10-CM

## 2021-11-09 ENCOUNTER — Other Ambulatory Visit (HOSPITAL_COMMUNITY): Payer: Self-pay

## 2021-11-09 NOTE — Telephone Encounter (Signed)
Patient is requesting a refill of the following medications: Requested Prescriptions   Pending Prescriptions Disp Refills   amitriptyline (ELAVIL) 50 MG tablet 90 tablet 1    Sig: TAKE 1 TABLET BY MOUTH DAILY    Date of patient request: 11/09/21 Last office visit: 04/27/21 Date of last refill: 04/27/21 Last refill amount: 90

## 2021-11-10 ENCOUNTER — Other Ambulatory Visit (HOSPITAL_COMMUNITY): Payer: Self-pay

## 2021-11-10 MED ORDER — AMITRIPTYLINE HCL 50 MG PO TABS
ORAL_TABLET | ORAL | 0 refills | Status: DC
  Filled 2021-11-10: qty 90, fill #0
  Filled 2021-12-26: qty 90, 90d supply, fill #0

## 2021-11-10 NOTE — Telephone Encounter (Signed)
Office visit March 13 with prior PCP noted.  53-month follow-up planned.  Appears appointment is scheduled in November with Dr. Mitchel Honour.  Refill ordered.

## 2021-11-30 ENCOUNTER — Other Ambulatory Visit (HOSPITAL_COMMUNITY): Payer: Self-pay

## 2021-11-30 MED ORDER — AMOXICILLIN 250 MG PO CAPS
250.0000 mg | ORAL_CAPSULE | Freq: Three times a day (TID) | ORAL | 0 refills | Status: DC
  Filled 2021-11-30 – 2021-12-26 (×2): qty 15, 5d supply, fill #0

## 2021-11-30 MED ORDER — ONDANSETRON 4 MG PO TBDP
ORAL_TABLET | ORAL | 0 refills | Status: DC
  Filled 2021-11-30 – 2021-12-26 (×2): qty 6, 2d supply, fill #0

## 2021-11-30 MED ORDER — HYDROCODONE-ACETAMINOPHEN 5-325 MG PO TABS
1.0000 | ORAL_TABLET | Freq: Four times a day (QID) | ORAL | 0 refills | Status: DC | PRN
  Filled 2021-11-30 – 2021-12-26 (×2): qty 8, 2d supply, fill #0

## 2021-11-30 MED ORDER — LORAZEPAM 2 MG PO TABS
ORAL_TABLET | ORAL | 0 refills | Status: DC
  Filled 2021-11-30 – 2021-12-26 (×2): qty 1, 1d supply, fill #0

## 2021-12-09 ENCOUNTER — Other Ambulatory Visit (HOSPITAL_COMMUNITY): Payer: Self-pay

## 2021-12-21 ENCOUNTER — Other Ambulatory Visit (HOSPITAL_COMMUNITY)
Admission: RE | Admit: 2021-12-21 | Discharge: 2021-12-21 | Disposition: A | Discharge status: Home / Self Care | Payer: No Typology Code available for payment source | Source: Ambulatory Visit | Attending: Family Medicine | Admitting: Family Medicine

## 2021-12-21 ENCOUNTER — Ambulatory Visit: Payer: No Typology Code available for payment source | Admitting: Family Medicine

## 2021-12-21 ENCOUNTER — Ambulatory Visit (INDEPENDENT_AMBULATORY_CARE_PROVIDER_SITE_OTHER): Payer: No Typology Code available for payment source | Admitting: Obstetrics & Gynecology

## 2021-12-21 ENCOUNTER — Encounter: Payer: Self-pay | Admitting: Obstetrics & Gynecology

## 2021-12-21 VITALS — BP 141/88 | HR 110 | Ht 65.0 in | Wt 215.5 lb

## 2021-12-21 DIAGNOSIS — N760 Acute vaginitis: Secondary | ICD-10-CM

## 2021-12-21 DIAGNOSIS — N898 Other specified noninflammatory disorders of vagina: Secondary | ICD-10-CM | POA: Insufficient documentation

## 2021-12-21 DIAGNOSIS — Z01419 Encounter for gynecological examination (general) (routine) without abnormal findings: Secondary | ICD-10-CM

## 2021-12-21 DIAGNOSIS — Z113 Encounter for screening for infections with a predominantly sexual mode of transmission: Secondary | ICD-10-CM

## 2021-12-21 DIAGNOSIS — B9689 Other specified bacterial agents as the cause of diseases classified elsewhere: Secondary | ICD-10-CM

## 2021-12-21 DIAGNOSIS — I10 Essential (primary) hypertension: Secondary | ICD-10-CM | POA: Insufficient documentation

## 2021-12-21 HISTORY — DX: Essential (primary) hypertension: I10

## 2021-12-21 NOTE — Progress Notes (Signed)
GYNECOLOGY ANNUAL PREVENTATIVE CARE ENCOUNTER NOTE  History:     Diana Thomas is a 26 y.o. [id number] female here for a routine annual gynecologic exam.  Current complaints: brown vaginal discharge, wants evaluation for vaginitis.  Also wants STI screen.  Has some irregular bleeding after stopping Depo Provera a few months ago and brown discharge.  No pelvic pain, problems with intercourse or other gynecologic concerns.    Gynecologic History No LMP recorded. (Menstrual status: Irregular Periods). Contraception: none Last Pap: 09/16/2017. Result was normal.   Obstetric History OB History  Gravida Para Term Preterm AB Living  1       1    SAB IAB Ectopic Multiple Live Births    1          # Outcome Date GA Lbr Len/2nd Weight Sex Delivery Anes PTL Lv  1 IAB             Past Medical History:  Diagnosis Date   Allergy    Anemia    Anxiety    Cannabinoid hyperemesis syndrome    Depression    Esophagitis    Gastritis    GERD (gastroesophageal reflux disease)    Heart murmur    Hypertension 12/21/2021    Past Surgical History:  Procedure Laterality Date   BIOPSY  07/23/2020   Procedure: BIOPSY;  Surgeon: Beverley Fiedler, MD;  Location: [facility];  Service: Gastroenterology;;   ESOPHAGOGASTRODUODENOSCOPY (EGD) WITH PROPOFOL N/A 07/23/2020   Procedure: ESOPHAGOGASTRODUODENOSCOPY (EGD) WITH PROPOFOL;  Surgeon: Beverley Fiedler, MD;  Location: [facility];  Service: Gastroenterology;  Laterality: N/A;    Current Outpatient Medications on File Prior to Visit  Medication Sig Dispense Refill   amitriptyline (ELAVIL) 50 MG tablet TAKE 1 TABLET BY MOUTH DAILY 90 tablet 0   amoxicillin (AMOXIL) 250 MG capsule Take 1 capsule (250 mg total) by mouth 3 (three) times daily. 15 capsule 0   FLUoxetine (PROZAC) 40 MG capsule Take 1 capsule (40 mg total) by mouth daily. 90 capsule 3   HYDROcodone-acetaminophen (NORCO/VICODIN) 5-325 MG tablet Take 1 tablet by mouth every 6 hours as needed  for pain 8 tablet 0   LORazepam (ATIVAN) 2 MG tablet Take 1 tablet by mouth 1 hour before surgery with small sip of water 1 tablet 0   ondansetron (ZOFRAN-ODT) 8 MG disintegrating tablet Dissolve 1 tablet by mouth every 8 hours as needed for nausea or vomiting. 30 tablet 3   pantoprazole (PROTONIX) 40 MG tablet Take 1 tablet (40 mg total) by mouth daily. 90 tablet 3   promethazine (PHENERGAN) 12.5 MG tablet Take 1 tablet by mouth every 8 hours as needed for nausea or vomiting. 30 tablet 1   QUEtiapine (SEROQUEL) 100 MG tablet TAKE 1 TABLET BY MOUTH NIGHTLY AT BEDTIME 90 tablet 3   SUMAtriptan (IMITREX) 20 MG/ACT nasal spray Place 1 spray into the nose at onset as needed for migraine or headache. May repeat in 2 hours if abdominal pain/vomiting persists or recurs. Do not take more than 2 doses in a day and 6 doses in a week 6 each 6   telmisartan (MICARDIS) 20 MG tablet Take 1 tablet by mouth daily. 90 tablet 1   famotidine (PEPCID) 20 MG tablet Take 1/2 tablet (10 mg total) by mouth 2 times daily. 30 tablet 0   medroxyPROGESTERone (DEPO-PROVERA) 150 MG/ML injection Inject 1 mL (150 mg total) into the muscle every 3 (three) months. 1 mL 2   ondansetron (ZOFRAN-ODT)  4 MG disintegrating tablet Dissolve 1 tablet on the tongue every 8 hours as needed for nausea/vomitting. 6 tablet 0   No current facility-administered medications on file prior to visit.    No Known Allergies  Social History:  reports that she has never smoked. She has never used smokeless tobacco. She reports that she does not currently use alcohol. She reports current drug use. Drug: Marijuana.  Family History  Problem Relation Age of Onset   Heart disease Mother    Hypertension Mother    Hyperlipidemia Mother    ADD / ADHD Father    Healthy Father    Healthy Sister    Liver disease Maternal Grandmother    Diabetes Maternal Uncle    Colon cancer Neg Hx    Esophageal cancer Neg Hx    Stomach cancer Neg Hx    Rectal cancer  Neg Hx    Pancreatic cancer Neg Hx     The following portions of the patient's history were reviewed and updated as appropriate: allergies, current medications, past family history, past medical history, past social history, past surgical history and problem list.  Review of Systems Pertinent items noted in HPI and remainder of comprehensive ROS otherwise negative.  Physical Exam:  BP (!) 141/88   Pulse (!) 110   Ht 5\' 5"  (1.651 m)   Wt 215 lb 8 oz (97.8 kg)   BMI 35.86 kg/m  CONSTITUTIONAL: Well-developed, well-nourished female in no acute distress.  HENT:  Normocephalic, atraumatic, External right and left ear normal.  EYES: Conjunctivae and EOM are normal. Pupils are equal, round, and reactive to light. No scleral icterus.  NECK: Normal range of motion, supple, no masses.  Normal thyroid.  SKIN: Skin is warm and dry. No rash noted. Not diaphoretic. No erythema. No pallor. MUSCULOSKELETAL: Normal range of motion. No tenderness.  No cyanosis, clubbing, or edema. NEUROLOGIC: Alert and oriented to person, place, and time. Normal reflexes, muscle tone coordination.  PSYCHIATRIC: Normal mood and affect. Normal behavior. Normal judgment and thought content. CARDIOVASCULAR: Normal heart rate noted, regular rhythm RESPIRATORY: Clear to auscultation bilaterally. Effort and breath sounds normal, no problems with respiration noted. BREASTS: Symmetric in size. No masses, tenderness, skin changes, nipple drainage, or lymphadenopathy bilaterally. Performed in the presence of a chaperone. ABDOMEN: Soft, no distention noted.  No tenderness, rebound or guarding.  PELVIC: Normal appearing external genitalia and urethral meatus; normal appearing vaginal mucosa and cervix.  Brown vaginal discharge noted.  Pap smear obtained.  Normal uterine size, no other palpable masses, no uterine or adnexal tenderness.  Performed in the presence of a chaperone.   Assessment and Plan:     1. Routine screening for STI  (sexually transmitted infection) STI screen done, recommended condoms for protection (and contraception) - Cytology - PAP - Cervicovaginal ancillary only - RPR+HBsAg+HCVAb+...  2. Vaginal discharge - Cervicovaginal ancillary only done, will follow up results and manage accordingly.  3. Well woman exam with routine gynecological exam - Cytology - PAP Will follow up results of pap smear and manage accordingly. Hypertension managed by PCP. Routine preventative health maintenance measures emphasized. Please refer to After Visit Summary for other counseling recommendations.      Verita Schneiders, MD, [employer] [employer], [place]

## 2021-12-22 ENCOUNTER — Other Ambulatory Visit (HOSPITAL_COMMUNITY): Payer: Self-pay

## 2021-12-22 LAB — CERVICOVAGINAL ANCILLARY ONLY
Bacterial Vaginitis (gardnerella): POSITIVE — AB
Candida Glabrata: NEGATIVE
Candida Vaginitis: NEGATIVE
Chlamydia: NEGATIVE
Comment: NEGATIVE
Comment: NEGATIVE
Comment: NEGATIVE
Comment: NEGATIVE
Comment: NEGATIVE
Comment: NORMAL
Neisseria Gonorrhea: NEGATIVE
Trichomonas: NEGATIVE

## 2021-12-22 LAB — CYTOLOGY - PAP: Diagnosis: NEGATIVE

## 2021-12-22 LAB — RPR+HBSAG+HCVAB+...
HIV Screen 4th Generation wRfx: NONREACTIVE
Hep C Virus Ab: NONREACTIVE
Hepatitis B Surface Ag: NEGATIVE
RPR Ser Ql: NONREACTIVE

## 2021-12-22 MED ORDER — METRONIDAZOLE 500 MG PO TABS
500.0000 mg | ORAL_TABLET | Freq: Two times a day (BID) | ORAL | 0 refills | Status: AC
  Filled 2021-12-22: qty 14, 7d supply, fill #0

## 2021-12-22 NOTE — Addendum Note (Signed)
Addended by: Verita Schneiders A on: 12/22/2021 02:22 PM   Modules accepted: Orders

## 2021-12-26 ENCOUNTER — Other Ambulatory Visit (HOSPITAL_COMMUNITY): Payer: Self-pay

## 2021-12-28 ENCOUNTER — Other Ambulatory Visit (HOSPITAL_COMMUNITY): Payer: Self-pay

## 2021-12-28 MED ORDER — TRAMADOL HCL 50 MG PO TABS
50.0000 mg | ORAL_TABLET | ORAL | 0 refills | Status: DC
  Filled 2021-12-28: qty 8, 2d supply, fill #0

## 2021-12-29 ENCOUNTER — Other Ambulatory Visit (HOSPITAL_COMMUNITY): Payer: Self-pay

## 2021-12-31 ENCOUNTER — Ambulatory Visit: Payer: No Typology Code available for payment source | Admitting: Emergency Medicine

## 2022-01-21 ENCOUNTER — Other Ambulatory Visit (HOSPITAL_COMMUNITY): Payer: Self-pay

## 2022-01-21 ENCOUNTER — Encounter: Payer: Self-pay | Admitting: Family Medicine

## 2022-01-21 ENCOUNTER — Ambulatory Visit (INDEPENDENT_AMBULATORY_CARE_PROVIDER_SITE_OTHER): Payer: No Typology Code available for payment source | Admitting: Family Medicine

## 2022-01-21 VITALS — BP 134/82 | HR 79 | Temp 97.6°F | Ht 63.5 in | Wt 211.4 lb

## 2022-01-21 DIAGNOSIS — F1210 Cannabis abuse, uncomplicated: Secondary | ICD-10-CM | POA: Diagnosis not present

## 2022-01-21 DIAGNOSIS — R45851 Suicidal ideations: Secondary | ICD-10-CM | POA: Diagnosis not present

## 2022-01-21 DIAGNOSIS — F411 Generalized anxiety disorder: Secondary | ICD-10-CM

## 2022-01-21 DIAGNOSIS — F1290 Cannabis use, unspecified, uncomplicated: Secondary | ICD-10-CM

## 2022-01-21 DIAGNOSIS — I10 Essential (primary) hypertension: Secondary | ICD-10-CM

## 2022-01-21 DIAGNOSIS — R112 Nausea with vomiting, unspecified: Secondary | ICD-10-CM

## 2022-01-21 DIAGNOSIS — R1115 Cyclical vomiting syndrome unrelated to migraine: Secondary | ICD-10-CM

## 2022-01-21 DIAGNOSIS — F331 Major depressive disorder, recurrent, moderate: Secondary | ICD-10-CM | POA: Diagnosis not present

## 2022-01-21 DIAGNOSIS — Z6836 Body mass index (BMI) 36.0-36.9, adult: Secondary | ICD-10-CM

## 2022-01-21 MED ORDER — HYDROCHLOROTHIAZIDE 25 MG PO TABS
25.0000 mg | ORAL_TABLET | Freq: Every day | ORAL | 0 refills | Status: DC
  Filled 2022-01-21 – 2022-02-05 (×2): qty 90, 90d supply, fill #0

## 2022-01-21 MED ORDER — ONDANSETRON 8 MG PO TBDP
8.0000 mg | ORAL_TABLET | Freq: Three times a day (TID) | ORAL | 3 refills | Status: DC | PRN
  Filled 2022-01-21 – 2022-04-10 (×2): qty 30, 10d supply, fill #0
  Filled 2022-06-24: qty 30, 10d supply, fill #1
  Filled 2022-09-02: qty 30, 10d supply, fill #2
  Filled 2022-11-24: qty 30, 10d supply, fill #3

## 2022-01-21 MED ORDER — QUETIAPINE FUMARATE 100 MG PO TABS
100.0000 mg | ORAL_TABLET | Freq: Every evening | ORAL | 3 refills | Status: DC
  Filled 2022-01-21: qty 90, fill #0
  Filled 2022-07-09: qty 90, 90d supply, fill #0

## 2022-01-21 MED ORDER — AMITRIPTYLINE HCL 50 MG PO TABS
50.0000 mg | ORAL_TABLET | Freq: Every day | ORAL | 0 refills | Status: DC
  Filled 2022-01-21: qty 90, fill #0
  Filled 2022-04-10: qty 90, 90d supply, fill #0

## 2022-01-21 MED ORDER — SUMATRIPTAN 20 MG/ACT NA SOLN
20.0000 mg | NASAL | 6 refills | Status: DC | PRN
  Filled 2022-01-21 – 2022-04-10 (×2): qty 6, 15d supply, fill #0

## 2022-01-21 MED ORDER — PANTOPRAZOLE SODIUM 40 MG PO TBEC
40.0000 mg | DELAYED_RELEASE_TABLET | Freq: Every day | ORAL | 3 refills | Status: DC
  Filled 2022-01-21 – 2022-11-24 (×2): qty 90, 90d supply, fill #0

## 2022-01-21 MED ORDER — PROMETHAZINE HCL 12.5 MG PO TABS
12.5000 mg | ORAL_TABLET | Freq: Three times a day (TID) | ORAL | 1 refills | Status: DC | PRN
  Filled 2022-01-21 – 2022-04-10 (×2): qty 30, 10d supply, fill #0
  Filled 2022-09-02: qty 30, 10d supply, fill #1

## 2022-01-21 MED ORDER — FLUOXETINE HCL 40 MG PO CAPS
40.0000 mg | ORAL_CAPSULE | Freq: Every day | ORAL | 3 refills | Status: DC
  Filled 2022-01-21: qty 90, 90d supply, fill #0

## 2022-01-21 MED ORDER — FAMOTIDINE 20 MG PO TABS
10.0000 mg | ORAL_TABLET | Freq: Two times a day (BID) | ORAL | 0 refills | Status: DC
  Filled 2022-01-21 – 2022-02-05 (×2): qty 30, 30d supply, fill #0

## 2022-01-21 NOTE — Patient Instructions (Signed)
For blood pressure, we are stopping olmasartan. And starting hydrochlorothiazide. We are checking labs. Please check blood pressure at home. If greater than 140/90, please follow up sooner.   For anxiety/depression, we are referring to psychiatry. We refilled your medications.   For vomiting, we refilled your medications.

## 2022-01-21 NOTE — Progress Notes (Signed)
Assessment/Plan:   Problem List Items Addressed This Visit       Cardiovascular and Mediastinum   Hypertension    Discontinue, telmisartan Trial HCTZ Restratification labs       Relevant Medications   hydrochlorothiazide (HYDRODIURIL) 25 MG tablet   Other Relevant Orders   TSH   Lipid panel   Hemoglobin A1c   Microalbumin / creatinine urine ratio   Urinalysis, Routine w reflex microscopic   CBC with Differential/Platelet   Comprehensive metabolic panel     Digestive   Cannabinoid hyperemesis syndrome    Recommended cessation of THC        Other   Generalized anxiety disorder   Relevant Medications   amitriptyline (ELAVIL) 50 MG tablet   FLUoxetine (PROZAC) 40 MG capsule   QUEtiapine (SEROQUEL) 100 MG tablet   Other Relevant Orders   Ambulatory referral to Psychiatry   Moderate episode of recurrent major depressive disorder (HCC)    Associated with anxiety and passive suicidal ideation No plan to act upon, Suicide resources provided Side effects of medication have led to weight gain Given difficulty to control, will refer to psychiatry Continue current medications, refilled      Relevant Medications   amitriptyline (ELAVIL) 50 MG tablet   FLUoxetine (PROZAC) 40 MG capsule   QUEtiapine (SEROQUEL) 100 MG tablet   Other Relevant Orders   Ambulatory referral to Psychiatry   Other Visit Diagnoses     Mild tetrahydrocannabinol (THC) abuse    -  Primary   Passive suicidal ideations       Relevant Medications   amitriptyline (ELAVIL) 50 MG tablet   QUEtiapine (SEROQUEL) 100 MG tablet   Other Relevant Orders   Ambulatory referral to Psychiatry   Cyclic vomiting syndrome       Relevant Medications   amitriptyline (ELAVIL) 50 MG tablet   famotidine (PEPCID) 20 MG tablet   ondansetron (ZOFRAN-ODT) 8 MG disintegrating tablet   pantoprazole (PROTONIX) 40 MG tablet   promethazine (PHENERGAN) 12.5 MG tablet   QUEtiapine (SEROQUEL) 100 MG tablet   SUMAtriptan  (IMITREX) 20 MG/ACT nasal spray   BMI 36.0-36.9,adult       Relevant Orders   TSH   Lipid panel   Hemoglobin A1c   Microalbumin / creatinine urine ratio   Urinalysis, Routine w reflex microscopic   CBC with Differential/Platelet   Comprehensive metabolic panel          Subjective:  HPI:  Diana Thomas is a 26 y.o. female who has Generalized anxiety disorder; Moderate episode of recurrent major depressive disorder (HCC); Gastroesophageal reflux disease without esophagitis; Cannabinoid hyperemesis syndrome; Cyclical vomiting; Hypokalemia; Intractable vomiting; Nausea & vomiting; Acute esophagitis; Acute gastritis without hemorrhage; Acute upper respiratory infection; Constipation; Current moderate episode of major depressive disorder without prior episode (HCC); Other atopic dermatitis and related conditions; and Hypertension on their problem list..   She  has a past medical history of Allergy, Anemia, Anxiety, Cannabinoid hyperemesis syndrome, Depression, Esophagitis, Gastritis, GERD (gastroesophageal reflux disease), Heart murmur, and Hypertension (12/21/2021)..   She presents with chief complaint of Establish Care (Adding diuretic to b/p medication.) .   Depression/Anxiety, established problem,  Current Medications: Amitriptyline, fluoxetine, quetiapine Side Effects: Patient reports significant weight gain over the past year Current Symptoms/Interim History: Reports no improvement with her current medications.  She has also tried benzodiazepines without improvement symptoms daily.  @    01/21/2022   10:20 AM 12/21/2021    9:10 AM 04/27/2021    1:04  PM  Depression screen PHQ 2/9  Decreased Interest 2 1 1   Down, Depressed, Hopeless 1 1 1   PHQ - 2 Score 3 2 2   Altered sleeping 1 1 2   Tired, decreased energy 2 1 2   Change in appetite 3 2 2   Feeling bad or failure about yourself  1 1 1   Trouble concentrating 0 0 1  Moving slowly or fidgety/restless 0 0 0  Suicidal thoughts 1  0 0  PHQ-9 Score 11 7 10   Difficult doing work/chores Somewhat difficult  Somewhat difficult       01/21/2022   10:20 AM  GAD 7 : Generalized Anxiety Score  Nervous, Anxious, on Edge 3  Control/stop worrying 3  Worry too much - different things 3  Trouble relaxing 3  Restless 1  Easily annoyed or irritable 3  Afraid - awful might happen 3  Total GAD 7 Score 19  Anxiety Difficulty Very difficult    ROS: No SI or HI.   Cyclical vomiting with GERD symptoms.  Patient has history of cyclical vomiting syndrome.  Patient does use THC.  She has followed with GI in the past.  She takes famotidine, Protonix, amitriptyline, Zofran, Phenergan as needed.  Reports overall stable.  Advised patient in marijuana cessation for additional symptom improvement.  Hypertension, established problem, Stable BP Readings from Last 3 Encounters:  01/21/22 134/82  12/21/21 (!) 141/88  04/27/21 131/77    Current Medications: Telmisartan, compliant without side effects. Interim History: Patient interested in trying diuretic she feels that she has had some fluid retention with some of her medications  ROS: Denies any chest pain, shortness of breath, dyspnea on exertion, leg edema.       Past Surgical History:  Procedure Laterality Date   BIOPSY  07/23/2020   Procedure: BIOPSY;  Surgeon: , MD;  Location: [facility];  Service: Gastroenterology;;   ESOPHAGOGASTRODUODENOSCOPY (EGD) WITH PROPOFOL N/A 07/23/2020   Procedure: ESOPHAGOGASTRODUODENOSCOPY (EGD) WITH PROPOFOL;  Surgeon: , MD;  Location: [facility];  Service: Gastroenterology;  Laterality: N/A;    Outpatient Medications Prior to Visit  Medication Sig Dispense Refill   amitriptyline (ELAVIL) 50 MG tablet TAKE 1 TABLET BY MOUTH DAILY 90 tablet 0   FLUoxetine (PROZAC) 40 MG capsule Take 1 capsule (40 mg total) by mouth daily. 90 capsule 3   ondansetron (ZOFRAN-ODT) 8 MG disintegrating tablet Dissolve 1 tablet by  mouth every 8 hours as needed for nausea or vomiting. 30 tablet 3   pantoprazole (PROTONIX) 40 MG tablet Take 1 tablet (40 mg total) by mouth daily. 90 tablet 3   promethazine (PHENERGAN) 12.5 MG tablet Take 1 tablet by mouth every 8 hours as needed for nausea or vomiting. 30 tablet 1   QUEtiapine (SEROQUEL) 100 MG tablet TAKE 1 TABLET BY MOUTH NIGHTLY AT BEDTIME 90 tablet 3   SUMAtriptan (IMITREX) 20 MG/ACT nasal spray Place 1 spray into the nose at onset as needed for migraine or headache. May repeat in 2 hours if abdominal pain/vomiting persists or recurs. Do not take more than 2 doses in a day and 6 doses in a week 6 each 6   telmisartan (MICARDIS) 20 MG tablet Take 1 tablet by mouth daily. 90 tablet 1   amoxicillin (AMOXIL) 250 MG capsule Take 1 capsule (250 mg total) by mouth 3 (three) times daily. (Patient not taking: Reported on 01/21/2022) 15 capsule 0   famotidine (PEPCID) 20 MG tablet Take 1/2 tablet (10 mg total) by mouth  2 times daily. 30 tablet 0   HYDROcodone-acetaminophen (NORCO/VICODIN) 5-325 MG tablet Take 1 tablet by mouth every 6 (six) hours as needed for pain. (Patient not taking: Reported on 01/21/2022) 8 tablet 0   LORazepam (ATIVAN) 2 MG tablet Take 1 tablet by mouth 1 hour before surgery with small sip of water (Patient not taking: Reported on 01/21/2022) 1 tablet 0   medroxyPROGESTERone (DEPO-PROVERA) 150 MG/ML injection Inject 1 mL (150 mg total) into the muscle every 3 (three) months. 1 mL 2   ondansetron (ZOFRAN-ODT) 4 MG disintegrating tablet Dissolve 1 tablet on the tongue every 8 hours as needed for nausea/vomitting. 6 tablet 0   traMADol (ULTRAM) 50 MG tablet Take 1 tablet (50 mg total) by mouth every 4-6 hours as needed. (Patient not taking: Reported on 01/21/2022) 8 tablet 0   No facility-administered medications prior to visit.    Family History  Problem Relation Age of Onset   Heart disease Mother    Hypertension Mother    Hyperlipidemia Mother    ADD / ADHD  Father    Healthy Father    Healthy Sister    Liver disease Maternal Grandmother    Diabetes Maternal Uncle    Colon cancer Neg Hx    Esophageal cancer Neg Hx    Stomach cancer Neg Hx    Rectal cancer Neg Hx    Pancreatic cancer Neg Hx     Social History   Socioeconomic History   Marital status: Single    Spouse name: Not on file   Number of children: 0   Years of education: Not on file   Highest education level: Not on file  Occupational History   Occupation: RN  Tobacco Use   Smoking status: Never    Passive exposure: Never   Smokeless tobacco: Never  Vaping Use   Vaping Use: Never used  Substance and Sexual Activity   Alcohol use: Yes    Alcohol/week: 2.0 standard drinks of alcohol    Types: 2 Glasses of wine per week    Comment: Occasionally   Drug use: Yes    Types: Marijuana    Comment: daily   Sexual activity: Yes  Other Topics Concern   Not on file  Social History Narrative   Not on file   Social Determinants of Health   Financial Resource Strain: Not on file  Food Insecurity: No Food Insecurity (12/21/2021)   Hunger Vital Sign    Worried About Running Out of Food in the Last Year: Never true    Ran Out of Food in the Last Year: Never true  Transportation Needs: No Transportation Needs (12/21/2021)   PRAPARE - Administrator, Civil ServiceTransportation    Lack of Transportation (Medical): No    Lack of Transportation (Non-Medical): No  Physical Activity: Not on file  Stress: Not on file  Social Connections: Not on file  Intimate Partner Violence: Not on file                                                                                                 Objective:  Physical Exam: BP  134/82 (BP Location: Left Arm, Patient Position: Sitting, Cuff Size: Large)   Pulse 79   Temp 97.6 F (36.4 C) (Temporal)   Ht 5' 3.5" (1.613 m)   Wt 211 lb 6.4 oz (95.9 kg)   SpO2 98%   BMI 36.86 kg/m    General: No acute distress. Awake and conversant.  Eyes: Normal conjunctiva,  anicteric. Round symmetric pupils.  ENT: Hearing grossly intact. No nasal discharge.  Neck: Neck is supple. No masses or thyromegaly.  Respiratory: Respirations are non-labored. No auditory wheezing.  CTAB Skin: Warm. No rashes or ulcers.  Psych: Alert and oriented. Cooperative, Appropriate mood and affect, Normal judgment.  CV: No cyanosis or JVD, RRR NO MRG MSK: Normal ambulation. No clubbing  Neuro: Sensation and CN II-XII grossly normal.        Garner Nash, MD, MS

## 2022-01-22 ENCOUNTER — Other Ambulatory Visit (HOSPITAL_COMMUNITY): Payer: Self-pay

## 2022-01-25 NOTE — Assessment & Plan Note (Signed)
Associated with anxiety and passive suicidal ideation No plan to act upon, Suicide resources provided Side effects of medication have led to weight gain Given difficulty to control, will refer to psychiatry Continue current medications, refilled

## 2022-01-25 NOTE — Assessment & Plan Note (Signed)
Discontinue, telmisartan Trial HCTZ Restratification labs

## 2022-01-25 NOTE — Assessment & Plan Note (Signed)
Recommended cessation of THC

## 2022-01-30 ENCOUNTER — Other Ambulatory Visit (HOSPITAL_COMMUNITY): Payer: Self-pay

## 2022-02-05 ENCOUNTER — Other Ambulatory Visit: Payer: Self-pay

## 2022-02-18 DIAGNOSIS — F331 Major depressive disorder, recurrent, moderate: Secondary | ICD-10-CM | POA: Diagnosis not present

## 2022-02-18 DIAGNOSIS — F411 Generalized anxiety disorder: Secondary | ICD-10-CM | POA: Diagnosis not present

## 2022-02-18 DIAGNOSIS — F422 Mixed obsessional thoughts and acts: Secondary | ICD-10-CM | POA: Diagnosis not present

## 2022-03-04 DIAGNOSIS — F331 Major depressive disorder, recurrent, moderate: Secondary | ICD-10-CM | POA: Diagnosis not present

## 2022-03-04 DIAGNOSIS — F422 Mixed obsessional thoughts and acts: Secondary | ICD-10-CM | POA: Diagnosis not present

## 2022-03-04 DIAGNOSIS — F411 Generalized anxiety disorder: Secondary | ICD-10-CM | POA: Diagnosis not present

## 2022-03-09 DIAGNOSIS — F4310 Post-traumatic stress disorder, unspecified: Secondary | ICD-10-CM | POA: Diagnosis not present

## 2022-03-09 DIAGNOSIS — F411 Generalized anxiety disorder: Secondary | ICD-10-CM | POA: Diagnosis not present

## 2022-03-09 DIAGNOSIS — F331 Major depressive disorder, recurrent, moderate: Secondary | ICD-10-CM | POA: Diagnosis not present

## 2022-03-09 DIAGNOSIS — F422 Mixed obsessional thoughts and acts: Secondary | ICD-10-CM | POA: Diagnosis not present

## 2022-03-15 ENCOUNTER — Ambulatory Visit (HOSPITAL_BASED_OUTPATIENT_CLINIC_OR_DEPARTMENT_OTHER): Payer: 59 | Admitting: Psychiatry

## 2022-03-15 ENCOUNTER — Encounter (HOSPITAL_COMMUNITY): Payer: Self-pay | Admitting: Psychiatry

## 2022-03-15 ENCOUNTER — Other Ambulatory Visit: Payer: Self-pay

## 2022-03-15 ENCOUNTER — Encounter (HOSPITAL_COMMUNITY): Payer: Self-pay

## 2022-03-15 ENCOUNTER — Other Ambulatory Visit (HOSPITAL_COMMUNITY): Payer: Self-pay

## 2022-03-15 VITALS — Wt 209.0 lb

## 2022-03-15 DIAGNOSIS — F1210 Cannabis abuse, uncomplicated: Secondary | ICD-10-CM

## 2022-03-15 DIAGNOSIS — F39 Unspecified mood [affective] disorder: Secondary | ICD-10-CM

## 2022-03-15 MED ORDER — LAMOTRIGINE 25 MG PO TABS
ORAL_TABLET | ORAL | 0 refills | Status: DC
  Filled 2022-03-15: qty 60, 33d supply, fill #0
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
## 2022-03-18 ENCOUNTER — Other Ambulatory Visit (HOSPITAL_COMMUNITY): Payer: Self-pay

## 2022-04-06 ENCOUNTER — Telehealth (HOSPITAL_BASED_OUTPATIENT_CLINIC_OR_DEPARTMENT_OTHER): Payer: 59 | Admitting: Psychiatry

## 2022-04-06 ENCOUNTER — Other Ambulatory Visit (HOSPITAL_COMMUNITY): Payer: Self-pay

## 2022-04-06 ENCOUNTER — Encounter (HOSPITAL_COMMUNITY): Payer: Self-pay | Admitting: Psychiatry

## 2022-04-06 ENCOUNTER — Other Ambulatory Visit: Payer: Self-pay

## 2022-04-06 VITALS — Wt 202.0 lb

## 2022-04-06 DIAGNOSIS — F4312 Post-traumatic stress disorder, chronic: Secondary | ICD-10-CM | POA: Diagnosis not present

## 2022-04-06 DIAGNOSIS — F1210 Cannabis abuse, uncomplicated: Secondary | ICD-10-CM

## 2022-04-06 DIAGNOSIS — F39 Unspecified mood [affective] disorder: Secondary | ICD-10-CM | POA: Diagnosis not present

## 2022-04-06 MED ORDER — LAMOTRIGINE 100 MG PO TABS
100.0000 mg | ORAL_TABLET | Freq: Every day | ORAL | 1 refills | Status: DC
  Filled 2022-04-06: qty 30, 30d supply, fill #0
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
## 2022-04-10 ENCOUNTER — Other Ambulatory Visit (HOSPITAL_COMMUNITY): Payer: Self-pay

## 2022-04-12 ENCOUNTER — Other Ambulatory Visit (HOSPITAL_COMMUNITY): Payer: Self-pay

## 2022-04-12 ENCOUNTER — Other Ambulatory Visit: Payer: Self-pay

## 2022-04-13 ENCOUNTER — Other Ambulatory Visit (HOSPITAL_COMMUNITY): Payer: Self-pay

## 2022-04-13 DIAGNOSIS — F411 Generalized anxiety disorder: Secondary | ICD-10-CM | POA: Diagnosis not present

## 2022-04-13 DIAGNOSIS — F4310 Post-traumatic stress disorder, unspecified: Secondary | ICD-10-CM | POA: Diagnosis not present

## 2022-04-13 DIAGNOSIS — F422 Mixed obsessional thoughts and acts: Secondary | ICD-10-CM | POA: Diagnosis not present

## 2022-04-13 DIAGNOSIS — F331 Major depressive disorder, recurrent, moderate: Secondary | ICD-10-CM | POA: Diagnosis not present

## 2022-04-27 DIAGNOSIS — F411 Generalized anxiety disorder: Secondary | ICD-10-CM | POA: Diagnosis not present

## 2022-04-27 DIAGNOSIS — F331 Major depressive disorder, recurrent, moderate: Secondary | ICD-10-CM | POA: Diagnosis not present

## 2022-04-27 DIAGNOSIS — F4310 Post-traumatic stress disorder, unspecified: Secondary | ICD-10-CM | POA: Diagnosis not present

## 2022-04-27 DIAGNOSIS — F422 Mixed obsessional thoughts and acts: Secondary | ICD-10-CM | POA: Diagnosis not present

## 2022-05-05 ENCOUNTER — Other Ambulatory Visit: Payer: Self-pay

## 2022-05-05 ENCOUNTER — Encounter (HOSPITAL_COMMUNITY): Payer: Self-pay | Admitting: Psychiatry

## 2022-05-05 ENCOUNTER — Telehealth (HOSPITAL_BASED_OUTPATIENT_CLINIC_OR_DEPARTMENT_OTHER): Payer: 59 | Admitting: Psychiatry

## 2022-05-05 ENCOUNTER — Other Ambulatory Visit (HOSPITAL_COMMUNITY): Payer: Self-pay

## 2022-05-05 VITALS — Wt 200.0 lb

## 2022-05-05 DIAGNOSIS — F1210 Cannabis abuse, uncomplicated: Secondary | ICD-10-CM

## 2022-05-05 DIAGNOSIS — F39 Unspecified mood [affective] disorder: Secondary | ICD-10-CM | POA: Diagnosis not present

## 2022-05-05 DIAGNOSIS — F4312 Post-traumatic stress disorder, chronic: Secondary | ICD-10-CM

## 2022-05-05 MED ORDER — LAMOTRIGINE 100 MG PO TABS
100.0000 mg | ORAL_TABLET | Freq: Every day | ORAL | 2 refills | Status: DC
  Filled 2022-05-05: qty 30, 30d supply, fill #0
  Filled 2022-07-25: qty 30, 30d supply, fill #1
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
## 2022-05-12 DIAGNOSIS — F331 Major depressive disorder, recurrent, moderate: Secondary | ICD-10-CM | POA: Diagnosis not present

## 2022-05-12 DIAGNOSIS — F422 Mixed obsessional thoughts and acts: Secondary | ICD-10-CM | POA: Diagnosis not present

## 2022-05-12 DIAGNOSIS — F411 Generalized anxiety disorder: Secondary | ICD-10-CM | POA: Diagnosis not present

## 2022-05-12 DIAGNOSIS — F4310 Post-traumatic stress disorder, unspecified: Secondary | ICD-10-CM | POA: Diagnosis not present

## 2022-05-25 DIAGNOSIS — F411 Generalized anxiety disorder: Secondary | ICD-10-CM | POA: Diagnosis not present

## 2022-05-25 DIAGNOSIS — F4310 Post-traumatic stress disorder, unspecified: Secondary | ICD-10-CM | POA: Diagnosis not present

## 2022-05-25 DIAGNOSIS — F331 Major depressive disorder, recurrent, moderate: Secondary | ICD-10-CM | POA: Diagnosis not present

## 2022-05-25 DIAGNOSIS — F422 Mixed obsessional thoughts and acts: Secondary | ICD-10-CM | POA: Diagnosis not present

## 2022-06-02 ENCOUNTER — Telehealth: Payer: Self-pay | Admitting: Family Medicine

## 2022-06-02 ENCOUNTER — Other Ambulatory Visit: Payer: Self-pay

## 2022-06-02 ENCOUNTER — Telehealth: Payer: 59 | Admitting: Physician Assistant

## 2022-06-02 ENCOUNTER — Other Ambulatory Visit (HOSPITAL_COMMUNITY): Payer: Self-pay

## 2022-06-02 DIAGNOSIS — B3731 Acute candidiasis of vulva and vagina: Secondary | ICD-10-CM

## 2022-06-02 MED ORDER — FLUCONAZOLE 150 MG PO TABS
150.0000 mg | ORAL_TABLET | Freq: Once | ORAL | 0 refills | Status: AC
  Filled 2022-06-02 (×2): qty 1, 1d supply, fill #0

## 2022-06-02 NOTE — Progress Notes (Signed)

## 2022-06-02 NOTE — Progress Notes (Signed)
I have spent 5 minutes in review of e-visit questionnaire, review and updating patient chart, medical decision making and response to patient.   Elvi Leventhal Cody Baylin Cabal, PA-C    

## 2022-06-02 NOTE — Telephone Encounter (Signed)
Pt would like for you to call her. Pt did not want to mention why

## 2022-06-03 NOTE — Telephone Encounter (Signed)
Tried calling patient , but call ended. Tried calling back, but was sent to voicemail. Left patient a detailed voice message to return call to office.

## 2022-06-11 ENCOUNTER — Other Ambulatory Visit: Payer: Self-pay

## 2022-06-11 ENCOUNTER — Encounter: Payer: Self-pay | Admitting: Family Medicine

## 2022-06-11 ENCOUNTER — Other Ambulatory Visit (HOSPITAL_COMMUNITY): Payer: Self-pay

## 2022-06-11 ENCOUNTER — Ambulatory Visit (INDEPENDENT_AMBULATORY_CARE_PROVIDER_SITE_OTHER): Payer: 59 | Admitting: Family Medicine

## 2022-06-11 VITALS — BP 124/78 | HR 77 | Temp 97.6°F | Wt 192.8 lb

## 2022-06-11 DIAGNOSIS — Z6836 Body mass index (BMI) 36.0-36.9, adult: Secondary | ICD-10-CM | POA: Diagnosis not present

## 2022-06-11 DIAGNOSIS — F411 Generalized anxiety disorder: Secondary | ICD-10-CM | POA: Diagnosis not present

## 2022-06-11 DIAGNOSIS — I10 Essential (primary) hypertension: Secondary | ICD-10-CM

## 2022-06-11 DIAGNOSIS — L819 Disorder of pigmentation, unspecified: Secondary | ICD-10-CM

## 2022-06-11 DIAGNOSIS — F422 Mixed obsessional thoughts and acts: Secondary | ICD-10-CM | POA: Diagnosis not present

## 2022-06-11 DIAGNOSIS — F331 Major depressive disorder, recurrent, moderate: Secondary | ICD-10-CM | POA: Diagnosis not present

## 2022-06-11 DIAGNOSIS — F4310 Post-traumatic stress disorder, unspecified: Secondary | ICD-10-CM | POA: Diagnosis not present

## 2022-06-11 LAB — LIPID PANEL
Cholesterol: 169 mg/dL (ref 0–200)
HDL: 29.8 mg/dL — ABNORMAL LOW (ref >39.00)
LDL Cholesterol: 122 mg/dL — ABNORMAL HIGH (ref 0–99)
NonHDL: 139.65
Total CHOL/HDL Ratio: 6
Triglycerides: 88 mg/dL (ref 0.0–149.0)
VLDL: 17.6 mg/dL (ref 0.0–40.0)

## 2022-06-11 LAB — CBC WITH DIFFERENTIAL/PLATELET
Basophils Absolute: 0 10*3/uL (ref 0.0–0.1)
Basophils Relative: 0.5 % (ref 0.0–3.0)
Eosinophils Absolute: 0.2 10*3/uL (ref 0.0–0.7)
Eosinophils Relative: 2.3 % (ref 0.0–5.0)
HCT: 41.2 % (ref 36.0–46.0)
Hemoglobin: 13.5 g/dL (ref 12.0–15.0)
Lymphocytes Relative: 30.5 % (ref 12.0–46.0)
Lymphs Abs: 2.8 10*3/uL (ref 0.7–4.0)
MCHC: 32.9 g/dL (ref 30.0–36.0)
MCV: 84.2 fl (ref 78.0–100.0)
Monocytes Absolute: 0.5 10*3/uL (ref 0.1–1.0)
Monocytes Relative: 5.3 % (ref 3.0–12.0)
Neutro Abs: 5.7 10*3/uL (ref 1.4–7.7)
Neutrophils Relative %: 61.4 % (ref 43.0–77.0)
Platelets: 330 10*3/uL (ref 150.0–400.0)
RBC: 4.89 Mil/uL (ref 3.87–5.11)
RDW: 13.8 % (ref 11.5–15.5)
WBC: 9.3 10*3/uL (ref 4.0–10.5)

## 2022-06-11 LAB — HEMOGLOBIN A1C: Hgb A1c MFr Bld: 6 % (ref 4.6–6.5)

## 2022-06-11 LAB — URINALYSIS, ROUTINE W REFLEX MICROSCOPIC
Bilirubin Urine: NEGATIVE
Hgb urine dipstick: NEGATIVE
Ketones, ur: NEGATIVE
Leukocytes,Ua: NEGATIVE
Nitrite: NEGATIVE
Specific Gravity, Urine: >=1.03 — AB (ref 1.000–1.030)
Total Protein, Urine: NEGATIVE
Urine Glucose: NEGATIVE
Urobilinogen, UA: 0.2 (ref 0.0–1.0)
pH: 6 (ref 5.0–8.0)

## 2022-06-11 LAB — COMPREHENSIVE METABOLIC PANEL
ALT: 17 U/L (ref 0–35)
AST: 19 U/L (ref 0–37)
Albumin: 4.6 g/dL (ref 3.5–5.2)
Alkaline Phosphatase: 77 U/L (ref 39–117)
BUN: 10 mg/dL (ref 6–23)
CO2: 26 mEq/L (ref 19–32)
Calcium: 9.9 mg/dL (ref 8.4–10.5)
Chloride: 101 mEq/L (ref 96–112)
Creatinine, Ser: 0.79 mg/dL (ref 0.40–1.20)
GFR: 101.2 mL/min (ref >60.00)
Glucose, Bld: 89 mg/dL (ref 70–99)
Potassium: 4.1 mEq/L (ref 3.5–5.1)
Sodium: 135 mEq/L (ref 135–145)
Total Bilirubin: 0.3 mg/dL (ref 0.2–1.2)
Total Protein: 7.6 g/dL (ref 6.0–8.3)

## 2022-06-11 LAB — MICROALBUMIN / CREATININE URINE RATIO
Creatinine,U: 423.8 mg/dL
Microalb Creat Ratio: 0.5 mg/g (ref 0.0–30.0)
Microalb, Ur: 2 mg/dL — ABNORMAL HIGH (ref 0.0–1.9)

## 2022-06-11 LAB — TSH: TSH: 1.09 u[IU]/mL (ref 0.35–5.50)

## 2022-06-11 MED ORDER — TRIAMCINOLONE ACETONIDE 0.5 % EX OINT
1.0000 | TOPICAL_OINTMENT | Freq: Two times a day (BID) | CUTANEOUS | 3 refills | Status: DC
  Filled 2022-06-11: qty 60, 30d supply, fill #0

## 2022-06-11 NOTE — Assessment & Plan Note (Signed)
Hyperpigmentation presenting as dark, itchy, scaly spots on the legs without a clear etiology.  Differential diagnosis:  Dermatologic infection Post-inflammatory hyperpigmentation Contact dermatitis Tinea corporis  Plan:  Referral to dermatology for further evaluation and management. Topical steroid (triamcinolone ointment 0.5%) to address itching and possible inflammatory nature of the lesions. Patient education on the importance of skin care and monitoring for changes to current lesions.

## 2022-06-11 NOTE — Assessment & Plan Note (Signed)
Stable on hydrochlorothiazide 25 mg.  Restratification labs

## 2022-06-11 NOTE — Progress Notes (Signed)
Assessment/Plan:   Problem List Items Addressed This Visit       Cardiovascular and Mediastinum   Hypertension    Stable on hydrochlorothiazide 25 mg.  Restratification labs         Musculoskeletal and Integument   Hyperpigmentation of skin - Primary    Hyperpigmentation presenting as dark, itchy, scaly spots on the legs without a clear etiology.  Differential diagnosis:  Dermatologic infection Post-inflammatory hyperpigmentation Contact dermatitis Tinea corporis  Plan:  Referral to dermatology for further evaluation and management. Topical steroid (triamcinolone ointment 0.5%) to address itching and possible inflammatory nature of the lesions. Patient education on the importance of skin care and monitoring for changes to current lesions.      Relevant Medications   triamcinolone ointment (KENALOG) 0.5 %   Other Relevant Orders   Ambulatory referral to Dermatology   Other Visit Diagnoses     BMI 36.0-36.9,adult           There are no discontinued medications.  No follow-ups on file.    Subjective:   Encounter date: 06/11/2022  CHEYNE BUNGERT is a 27 y.o. female who has Generalized anxiety disorder; Moderate episode of recurrent major depressive disorder (HCC); Gastroesophageal reflux disease without esophagitis; Cannabinoid hyperemesis syndrome; Cyclical vomiting; Hypokalemia; Intractable vomiting; Nausea & vomiting; Acute esophagitis; Acute gastritis without hemorrhage; Acute upper respiratory infection; Constipation; Current moderate episode of major depressive disorder without prior episode (HCC); Other atopic dermatitis and related conditions; Hypertension; and Hyperpigmentation of skin on their problem list..   She  has a past medical history of Allergy, Anemia, Anxiety, Cannabinoid hyperemesis syndrome, Depression, Esophagitis, Gastritis, GERD (gastroesophageal reflux disease), Heart murmur, and Hypertension (12/21/2021)..  CHIEF COMPLAINT: Diana Thomas  presents for follow-up regarding dark spots on her legs, also requests lab work information.  HISTORY OF PRESENT ILLNESS:  Skin Concerns: Diana Thomas has dark spots on both legs that started a couple of months ago. The lesions began on one leg and transitioned to the other, with the newest lesion appearing about 2 weeks ago. She reports some itching but no significant pain or other associated symptoms. She has tried applying lotion without significant improvement. No prior history of similar symptoms was reported, although she mentioned a history of itchy patches in areas where her clothing touches the skin.  Hypertension.:  Controlled on hydrochlorothiazide.  She is tolerating without any side effects.  Patient was unable to get previous lab work and is requesting that it be performed today.   Review of Systems  Constitutional:  Negative for chills, diaphoresis, fever, malaise/fatigue and weight loss.  HENT:  Negative for congestion, ear discharge, ear pain and hearing loss.   Eyes:  Negative for blurred vision, double vision, photophobia, pain, discharge and redness.  Respiratory:  Negative for cough, sputum production, shortness of breath and wheezing.   Cardiovascular:  Negative for chest pain and palpitations.  Gastrointestinal:  Negative for abdominal pain, blood in stool, constipation, diarrhea, heartburn, melena, nausea and vomiting.  Genitourinary:  Negative for dysuria, flank pain, frequency, hematuria and urgency.  Musculoskeletal:  Negative for myalgias.  Skin:  Positive for itching and rash.  Neurological:  Negative for dizziness, tingling, tremors, speech change, seizures, loss of consciousness, weakness and headaches.  Psychiatric/Behavioral:  Negative for depression, hallucinations, memory loss, substance abuse and suicidal ideas. The patient does not have insomnia.   All other systems reviewed and are negative.   Past Surgical History:  Procedure Laterality Date   BIOPSY   07/23/2020   Procedure:  BIOPSY;  Surgeon: Beverley Fiedler, MD;  Location: [facility];  Service: Gastroenterology;;   ESOPHAGOGASTRODUODENOSCOPY (EGD) WITH PROPOFOL N/A 07/23/2020   Procedure: ESOPHAGOGASTRODUODENOSCOPY (EGD) WITH PROPOFOL;  Surgeon: Beverley Fiedler, MD;  Location: [facility];  Service: Gastroenterology;  Laterality: N/A;    Outpatient Medications Prior to Visit  Medication Sig Dispense Refill   amitriptyline (ELAVIL) 50 MG tablet Take 1 tablet (50 mg total) by mouth daily. 90 tablet 0   lamoTRIgine (LAMICTAL) 100 MG tablet Take 1 tablet (100 mg total) by mouth daily. 30 tablet 2   ondansetron (ZOFRAN-ODT) 8 MG disintegrating tablet Dissolve 1 tablet by mouth every 8 hours as needed for nausea or vomiting. 30 tablet 3   pantoprazole (PROTONIX) 40 MG tablet Take 1 tablet (40 mg total) by mouth daily. 90 tablet 3   promethazine (PHENERGAN) 12.5 MG tablet Take 1 tablet by mouth every 8 hours as needed for nausea or vomiting. 30 tablet 1   SUMAtriptan (IMITREX) 20 MG/ACT nasal spray Place 1 spray into the nose at onset as needed for migraine or headache. May repeat in 2 hours if abdominal pain/vomiting persists or recurs. Do not take more than 2 doses in a day and 6 doses in a week 6 each 6   famotidine (PEPCID) 20 MG tablet Take 0.5 tablets (10 mg total) by mouth 2 (two) times daily. 30 tablet 0   FLUoxetine (PROZAC) 40 MG capsule Take 1 capsule (40 mg total) by mouth daily. (Patient not taking: Reported on 03/15/2022) 90 capsule 3   hydrochlorothiazide (HYDRODIURIL) 25 MG tablet Take 1 tablet (25 mg total) by mouth daily. 90 tablet 0   QUEtiapine (SEROQUEL) 100 MG tablet Take 1 tablet (100 mg total) by mouth at bedtime. (Patient not taking: Reported on 03/15/2022) 90 tablet 3   No facility-administered medications prior to visit.    Family History  Problem Relation Age of Onset   Heart disease Mother    Hypertension Mother    Hyperlipidemia Mother    ADD / ADHD Father     Healthy Father    Healthy Sister    Liver disease Maternal Grandmother    Diabetes Maternal Uncle    Colon cancer Neg Hx    Esophageal cancer Neg Hx    Stomach cancer Neg Hx    Rectal cancer Neg Hx    Pancreatic cancer Neg Hx     Social History   Socioeconomic History   Marital status: Single    Spouse name: Not on file   Number of children: 0   Years of education: Not on file   Highest education level: Not on file  Occupational History   Occupation: RN  Tobacco Use   Smoking status: Never    Passive exposure: Never   Smokeless tobacco: Never  Vaping Use   Vaping Use: Never used  Substance and Sexual Activity   Alcohol use: Yes    Alcohol/week: 2.0 standard drinks of alcohol    Types: 2 Glasses of wine per week    Comment: Occasionally   Drug use: Yes    Types: Marijuana    Comment: daily   Sexual activity: Yes  Other Topics Concern   Not on file  Social History Narrative   Not on file   Social Determinants of Health   Financial Resource Strain: Not on file  Food Insecurity: No Food Insecurity (12/21/2021)   Hunger Vital Sign    Worried About Running Out of Food in the Last Year: Never  true    Ran Out of Food in the Last Year: Never true  Transportation Needs: No Transportation Needs (12/21/2021)   PRAPARE - Administrator, Civil Service (Medical): No    Lack of Transportation (Non-Medical): No  Physical Activity: Not on file  Stress: Not on file  Social Connections: Not on file  Intimate Partner Violence: Not on file                                                                                                  Objective:  Physical Exam: BP 124/78 (BP Location: Left Arm, Patient Position: Sitting, Cuff Size: Large)   Pulse 77   Temp 97.6 F (36.4 C) (Temporal)   Wt 192 lb 12.8 oz (87.5 kg)   SpO2 97%   BMI 33.62 kg/m     Physical Exam Constitutional:      General: She is not in acute distress.    Appearance: Normal appearance. She  is not ill-appearing or toxic-appearing.  HENT:     Head: Normocephalic and atraumatic.     Nose: Nose normal. No congestion.  Eyes:     General: No scleral icterus.    Extraocular Movements: Extraocular movements intact.  Cardiovascular:     Rate and Rhythm: Normal rate and regular rhythm.     Pulses: Normal pulses.     Heart sounds: Normal heart sounds.  Pulmonary:     Effort: Pulmonary effort is normal. No respiratory distress.     Breath sounds: Normal breath sounds.  Abdominal:     General: Abdomen is flat. Bowel sounds are normal.     Palpations: Abdomen is soft.  Musculoskeletal:        General: Normal range of motion.  Lymphadenopathy:     Cervical: No cervical adenopathy.  Skin:    General: Skin is warm and dry.     Findings: Rash (Bilateral medial ankle and shin, multiple lesions about 1 cm) present. Rash is macular (Hyperpigmented) and scaling.  Neurological:     General: No focal deficit present.     Mental Status: She is alert and oriented to person, place, and time. Mental status is at baseline.  Psychiatric:        Mood and Affect: Mood normal.        Behavior: Behavior normal.        Thought Content: Thought content normal.        Judgment: Judgment normal.     No results found.  Recent Results (from the past 2160 hour(s))  Urinalysis, Routine w reflex microscopic     Status: Abnormal   Collection Time: 06/11/22  1:51 PM  Result Value Ref Range   Color, Urine YELLOW Yellow;Lt. Yellow;Straw;Dark Yellow;Amber;Green;Red;Brown   APPearance CLEAR Clear;Turbid;Slightly Cloudy;Cloudy   Specific Gravity, Urine >=1.030 (A) 1.000 - 1.030   pH 6.0 5.0 - 8.0   Total Protein, Urine NEGATIVE Negative   Urine Glucose NEGATIVE Negative   Ketones, ur NEGATIVE Negative   Bilirubin Urine NEGATIVE Negative   Hgb urine dipstick NEGATIVE Negative   Urobilinogen, UA 0.2 0.0 - 1.0  Leukocytes,Ua NEGATIVE Negative   Nitrite NEGATIVE Negative   WBC, UA 0-2/hpf 0-2/hpf    RBC / HPF 0-2/hpf 0-2/hpf   Mucus, UA Presence of (A) None   Squamous Epithelial / HPF Many(>10/hpf) (A) Rare(0-4/hpf)   Bacteria, UA Many(>50/hpf) (A) None        Garner Nash, MD, MS

## 2022-06-12 ENCOUNTER — Encounter: Payer: Self-pay | Admitting: Family Medicine

## 2022-06-12 DIAGNOSIS — N3000 Acute cystitis without hematuria: Secondary | ICD-10-CM

## 2022-06-12 DIAGNOSIS — R7303 Prediabetes: Secondary | ICD-10-CM

## 2022-06-14 ENCOUNTER — Other Ambulatory Visit: Payer: Self-pay

## 2022-06-14 ENCOUNTER — Other Ambulatory Visit (HOSPITAL_COMMUNITY): Payer: Self-pay

## 2022-06-14 MED ORDER — LANCET DEVICE MISC
1.0000 | Freq: Three times a day (TID) | 0 refills | Status: AC
Start: 2022-06-14 — End: 2022-07-14
  Filled 2022-06-14: qty 1, 30d supply, fill #0

## 2022-06-14 MED ORDER — NITROFURANTOIN MONOHYD MACRO 100 MG PO CAPS
100.0000 mg | ORAL_CAPSULE | Freq: Two times a day (BID) | ORAL | 0 refills | Status: AC
Start: 2022-06-14 — End: 2022-06-17
  Filled 2022-06-14: qty 6, 3d supply, fill #0

## 2022-06-14 MED ORDER — METFORMIN HCL 500 MG PO TABS
500.0000 mg | ORAL_TABLET | Freq: Every day | ORAL | 0 refills | Status: DC
Start: 2022-06-14 — End: 2023-03-24
  Filled 2022-06-14: qty 30, 30d supply, fill #0

## 2022-06-14 MED ORDER — BLOOD GLUCOSE MONITORING SUPPL DEVI
1.0000 | Freq: Three times a day (TID) | 0 refills | Status: DC
Start: 2022-06-14 — End: 2022-07-14
  Filled 2022-06-14: qty 1, fill #0

## 2022-06-14 MED ORDER — FREESTYLE LANCETS MISC
1.0000 | Freq: Three times a day (TID) | 0 refills | Status: AC
Start: 2022-06-14 — End: 2022-07-14
  Filled 2022-06-14: qty 100, 30d supply, fill #0

## 2022-06-14 MED ORDER — BLOOD GLUCOSE TEST VI STRP
1.0000 | ORAL_STRIP | Freq: Three times a day (TID) | 0 refills | Status: DC
Start: 2022-06-14 — End: 2022-07-14
  Filled 2022-06-14: qty 100, 34d supply, fill #0

## 2022-06-24 ENCOUNTER — Other Ambulatory Visit (HOSPITAL_COMMUNITY): Payer: Self-pay

## 2022-07-02 DIAGNOSIS — F422 Mixed obsessional thoughts and acts: Secondary | ICD-10-CM | POA: Diagnosis not present

## 2022-07-02 DIAGNOSIS — F4310 Post-traumatic stress disorder, unspecified: Secondary | ICD-10-CM | POA: Diagnosis not present

## 2022-07-02 DIAGNOSIS — F411 Generalized anxiety disorder: Secondary | ICD-10-CM | POA: Diagnosis not present

## 2022-07-02 DIAGNOSIS — F331 Major depressive disorder, recurrent, moderate: Secondary | ICD-10-CM | POA: Diagnosis not present

## 2022-07-08 DIAGNOSIS — F4310 Post-traumatic stress disorder, unspecified: Secondary | ICD-10-CM | POA: Diagnosis not present

## 2022-07-08 DIAGNOSIS — F422 Mixed obsessional thoughts and acts: Secondary | ICD-10-CM | POA: Diagnosis not present

## 2022-07-08 DIAGNOSIS — F411 Generalized anxiety disorder: Secondary | ICD-10-CM | POA: Diagnosis not present

## 2022-07-08 DIAGNOSIS — F331 Major depressive disorder, recurrent, moderate: Secondary | ICD-10-CM | POA: Diagnosis not present

## 2022-07-09 ENCOUNTER — Other Ambulatory Visit: Payer: Self-pay | Admitting: Family Medicine

## 2022-07-09 ENCOUNTER — Other Ambulatory Visit (HOSPITAL_COMMUNITY): Payer: Self-pay

## 2022-07-09 DIAGNOSIS — R45851 Suicidal ideations: Secondary | ICD-10-CM

## 2022-07-09 DIAGNOSIS — I10 Essential (primary) hypertension: Secondary | ICD-10-CM

## 2022-07-09 DIAGNOSIS — R1115 Cyclical vomiting syndrome unrelated to migraine: Secondary | ICD-10-CM

## 2022-07-09 DIAGNOSIS — F411 Generalized anxiety disorder: Secondary | ICD-10-CM

## 2022-07-09 DIAGNOSIS — F331 Major depressive disorder, recurrent, moderate: Secondary | ICD-10-CM

## 2022-07-09 MED ORDER — FAMOTIDINE 20 MG PO TABS
10.0000 mg | ORAL_TABLET | Freq: Two times a day (BID) | ORAL | 0 refills | Status: DC
Start: 2022-07-09 — End: 2022-11-24
  Filled 2022-07-09: qty 30, 30d supply, fill #0

## 2022-07-09 MED ORDER — HYDROCHLOROTHIAZIDE 25 MG PO TABS
25.0000 mg | ORAL_TABLET | Freq: Every day | ORAL | 0 refills | Status: DC
Start: 2022-07-09 — End: 2023-03-24
  Filled 2022-07-09: qty 90, 90d supply, fill #0

## 2022-07-09 MED ORDER — AMITRIPTYLINE HCL 50 MG PO TABS
50.0000 mg | ORAL_TABLET | Freq: Every day | ORAL | 0 refills | Status: DC
Start: 2022-07-09 — End: 2022-08-05
  Filled 2022-07-09: qty 90, 90d supply, fill #0

## 2022-07-10 ENCOUNTER — Other Ambulatory Visit (HOSPITAL_COMMUNITY): Payer: Self-pay

## 2022-07-12 ENCOUNTER — Other Ambulatory Visit: Payer: Self-pay

## 2022-07-14 ENCOUNTER — Encounter: Payer: Self-pay | Admitting: Obstetrics & Gynecology

## 2022-07-14 ENCOUNTER — Ambulatory Visit: Payer: 59 | Admitting: Obstetrics & Gynecology

## 2022-07-14 VITALS — BP 135/81 | HR 102 | Ht 63.5 in | Wt 188.0 lb

## 2022-07-14 DIAGNOSIS — Z3042 Encounter for surveillance of injectable contraceptive: Secondary | ICD-10-CM

## 2022-07-14 DIAGNOSIS — E559 Vitamin D deficiency, unspecified: Secondary | ICD-10-CM | POA: Diagnosis not present

## 2022-07-14 LAB — POCT PREGNANCY, URINE: Preg Test, Ur: NEGATIVE

## 2022-07-14 MED ORDER — MEDROXYPROGESTERONE ACETATE 150 MG/ML IM SUSP
150.0000 mg | Freq: Once | INTRAMUSCULAR | Status: AC
Start: 2022-07-14 — End: 2022-07-14
  Administered 2022-07-14: 150 mg via INTRAMUSCULAR

## 2022-07-14 NOTE — Progress Notes (Signed)
GYNECOLOGY OFFICE VISIT NOTE  History:   Diana Thomas is a 27 y.o. G1P0010 here today for re-initiation of Depo Provera.  Last dose was in 2021.  Has been on Depo Provera off and on for several years, since age 71. She denies any abnormal vaginal discharge, bleeding, pelvic pain or other concerns.    Past Medical History:  Diagnosis Date   Allergy    Anemia    Anxiety    Cannabinoid hyperemesis syndrome    Depression    Esophagitis    Gastritis    GERD (gastroesophageal reflux disease)    Heart murmur    Hypertension 12/21/2021    Past Surgical History:  Procedure Laterality Date   BIOPSY  07/23/2020   Procedure: BIOPSY;  Surgeon: Beverley Fiedler, MD;  Location: [facility];  Service: Gastroenterology;;   ESOPHAGOGASTRODUODENOSCOPY (EGD) WITH PROPOFOL N/A 07/23/2020   Procedure: ESOPHAGOGASTRODUODENOSCOPY (EGD) WITH PROPOFOL;  Surgeon: Beverley Fiedler, MD;  Location: [facility];  Service: Gastroenterology;  Laterality: N/A;    The following portions of the patient's history were reviewed and updated as appropriate: allergies, current medications, past family history, past medical history, past social history, past surgical history and problem list.   Health Maintenance:  Normal pap on 12/21/2021.   Review of Systems:  Pertinent items noted in HPI and remainder of comprehensive ROS otherwise negative.  Physical Exam:  BP 135/81   Pulse (!) 102   Ht 5' 3.5" (1.613 m)   Wt 188 lb (85.3 kg)   BMI 32.78 kg/m  CONSTITUTIONAL: Well-developed, well-nourished female in no acute distress.  HEENT:  Normocephalic, atraumatic. External right and left ear normal. No scleral icterus.  NECK: Normal range of motion, supple, no masses noted on observation SKIN: No rash noted. Not diaphoretic. No erythema. No pallor. MUSCULOSKELETAL: Normal range of motion. No edema noted. NEUROLOGIC: Alert and oriented to person, place, and time. Normal muscle tone coordination. No cranial nerve  deficit noted. PSYCHIATRIC: Normal mood and affect. Normal behavior. Normal judgment and thought content. CARDIOVASCULAR: Normal heart rate noted RESPIRATORY: Effort and breath sounds normal, no problems with respiration noted ABDOMEN: No masses noted. No other overt distention noted.   PELVIC: Deferred   Results for orders placed or performed in visit on 07/14/22 (from the past 24 hour(s))  Pregnancy, urine POC     Status: None   Collection Time: 07/14/22  9:59 AM  Result Value Ref Range   Preg Test, Ur NEGATIVE NEGATIVE      Assessment and Plan:    1. Encounter for management and injection of Depo-Provera 2. On Depo-Provera for contraception Discussed with her recommendations from FDA (2 year usage advised if no other options) vs ACOG (okay to continue long term) on bone density.  I told her I definitely recommend calcium and Vitamin D and recommend a Vitamin D level today given her long term usage, she is not currently on anything and because is African-American, which she is amenable to.  Plan for Depo Provera today and continue - VITAMIN D 25 Hydroxy (Vit-D Deficiency, Fractures) - medroxyPROGESTERone (DEPO-PROVERA) injection 150 mg Please refer to After Visit Summary for other counseling recommendations.   Return in about 3 months (around 10/14/2022) for Depo Provera.    I spent 25 minutes dedicated to the care of this patient including pre-visit review of records, face to face time with the patient discussing her conditions and treatments and post visit orders.    Jaynie Collins, MD, FACOG Obstetrician &  Gynecologist, Biochemist, clinical for [employer], [facility]

## 2022-07-14 NOTE — Patient Instructions (Signed)
Bone Health Bones protect organs, store calcium, anchor muscles, and support the whole body. Keeping your bones strong is important, especially as you get older. You can take actions to help keep your bones strong and healthy. Why is keeping my bones healthy important?  Keeping your bones healthy is important because your body constantly replaces bone cells. Cells get old, and new cells take their place. As we age, we lose bone cells because the body may not be able to make enough new cells to replace the old cells. The amount of bone cells and bone tissue you have is referred to as bone mass. The higher your bone mass, the stronger your bones. The aging process leads to an overall loss of bone mass in the body, which can increase the likelihood of: Broken bones. A condition in which the bones become weak and brittle (osteoporosis). A large decline in bone mass occurs in older adults. In women, it occurs about the time of menopause. What actions can I take to keep my bones healthy? Good health habits are important for maintaining healthy bones. This includes eating nutritious foods and exercising regularly. To have healthy bones, you need to get enough of the right minerals and vitamins. Most nutrition experts recommend getting these nutrients from the foods that you eat. In some cases, taking supplements may also be recommended. Doing certain types of exercise is also important for bone health. What are the nutritional recommendations for healthy bones?  Eating a well-balanced diet with plenty of calcium and vitamin D will help to protect your bones. Nutritional recommendations vary from person to person. Ask your health care provider what is healthy for you. Here are some general guidelines. Get enough calcium Calcium is the most important (essential) mineral for bone health. Most people can get enough calcium from their diet, but supplements may be recommended for people who are at risk for  osteoporosis. Good sources of calcium include: Dairy products, such as low-fat or nonfat milk, cheese, and yogurt. Dark green leafy vegetables, such as bok choy and broccoli. Foods that have calcium added to them (are fortified). Foods that may be fortified with calcium include orange juice, cereal, bread, soy beverages, and tofu products. Nuts, such as almonds. Follow these recommended amounts for daily calcium intake: Infants, 0-6 months: 200 mg. Infants, 6-12 months: 260 mg. Children, age 63-3: 700 mg. Children, age 21-8: 1,000 mg. Children, age 62-13: 1,300 mg. Teens, age 73-18: 1,300 mg. Adults, age 57-50: 1,000 mg. Adults, age 23-70: Men: 1,000 mg. Women: 1,200 mg. Adults, age 80 or older: 1,200 mg. Pregnant and breastfeeding females: Teens: 1,300 mg. Adults: 1,000 mg. Get enough vitamin D Vitamin D is the most essential vitamin for bone health. It helps the body absorb calcium. Sunlight stimulates the skin to make vitamin D, so be sure to get enough sunlight. If you live in a cold climate or you do not get outside often, your health care provider may recommend that you take vitamin D supplements. Good sources of vitamin D in your diet include: Egg yolks. Saltwater fish. Milk and cereal fortified with vitamin D. Follow these recommended amounts for daily vitamin D intake: Infants, 0-12 months: 400 international units (IU). Children and teens, age 63-18: 600 international units. Adults, age 64 or younger: 600 international units. Adults, age 50 or older: 600-1,000 international units. Get other important nutrients Other nutrients that are important for bone health include: Phosphorus. This mineral is found in meat, poultry, dairy foods, nuts, and legumes. The  recommended daily intake for adult men and adult women is 700 mg. Magnesium. This mineral is found in seeds, nuts, dark green vegetables, and legumes. The recommended daily intake for adult men is 400-420 mg. For adult women,  it is 310-320 mg. Vitamin K. This vitamin is found in green leafy vegetables. The recommended daily intake is 120 mcg for adult men and 90 mcg for adult women. What type of physical activity is best for building and maintaining healthy bones? Weight-bearing and strength-building activities are important for building and maintaining healthy bones. Weight-bearing activities cause muscles and bones to work against gravity. Strength-building activities increase the strength of the muscles that support bones. Weight-bearing and muscle-building activities include: Walking and hiking. Jogging and running. Dancing. Gym exercises. Lifting weights. Tennis and racquetball. Climbing stairs. Aerobics. Adults should get at least 30 minutes of moderate physical activity on most days. Children should get at least 60 minutes of moderate physical activity on most days. Ask your health care provider what type of exercise is best for you. How can I find out if my bone mass is low? Bone mass can be measured with an X-ray test called a bone mineral density (BMD) test. This test is recommended for all women who are age 58 or older. It may also be recommended for: Men who are age 36 or older. People who are at risk for osteoporosis because of: Having a long-term disease that weakens bones, such as kidney disease or rheumatoid arthritis. Having menopause earlier than normal. Taking medicine that weakens bones, such as steroids, thyroid hormones, or hormone treatment for breast cancer or prostate cancer. Smoking. Drinking three or more alcoholic drinks a day. Being underweight. Sedentary lifestyle. If you find that you have a low bone mass, you may be able to prevent osteoporosis or further bone loss by changing your diet and lifestyle. Where can I find more information? Bone Health & Osteoporosis Foundation: https://carlson-fletcher.info/ [employer]: www.bones.http://www.myers.net/ International Osteoporosis  Foundation: Investment banker, operational.iofbonehealth.org Summary The aging process leads to an overall loss of bone mass in the body, which can increase the likelihood of broken bones and osteoporosis. Eating a well-balanced diet with plenty of calcium and vitamin D will help to protect your bones. Weight-bearing and strength-building activities are also important for building and maintaining strong bones. Bone mass can be measured with an X-ray test called a bone mineral density (BMD) test. This information is not intended to replace advice given to you by your health care provider. Make sure you discuss any questions you have with your health care provider. Document Revised: 07/16/2020 Document Reviewed: 07/16/2020 Elsevier Patient Education  2024 [employer].

## 2022-07-15 LAB — VITAMIN D 25 HYDROXY (VIT D DEFICIENCY, FRACTURES): Vit D, 25-Hydroxy: 21 ng/mL — ABNORMAL LOW (ref 30.0–100.0)

## 2022-07-16 ENCOUNTER — Other Ambulatory Visit: Payer: Self-pay

## 2022-07-16 ENCOUNTER — Other Ambulatory Visit (HOSPITAL_COMMUNITY): Payer: Self-pay

## 2022-07-16 MED ORDER — CALCIUM CARBONATE 1250 (500 CA) MG PO CHEW
1.0000 | CHEWABLE_TABLET | Freq: Every day | ORAL | 1 refills | Status: AC
Start: 2022-07-16 — End: ?
  Filled 2022-07-16 – 2022-09-02 (×2): qty 90, 90d supply, fill #0

## 2022-07-16 MED ORDER — VITAMIN D (ERGOCALCIFEROL) 50000 UNITS PO CAPS
1.0000 | ORAL_CAPSULE | ORAL | 1 refills | Status: DC
Start: 2022-07-16 — End: 2023-05-21
  Filled 2022-07-16: qty 12, 84d supply, fill #0
  Filled 2023-01-15 – 2023-01-17 (×2): qty 12, 84d supply, fill #1

## 2022-07-16 NOTE — Addendum Note (Signed)
Addended by: Jaynie Collins A on: 07/16/2022 07:01 AM   Modules accepted: Orders

## 2022-07-26 ENCOUNTER — Other Ambulatory Visit: Payer: Self-pay

## 2022-08-03 ENCOUNTER — Telehealth (HOSPITAL_COMMUNITY): Payer: 59 | Admitting: Psychiatry

## 2022-08-05 ENCOUNTER — Other Ambulatory Visit: Payer: Self-pay

## 2022-08-05 ENCOUNTER — Encounter (HOSPITAL_COMMUNITY): Payer: Self-pay | Admitting: Psychiatry

## 2022-08-05 ENCOUNTER — Telehealth (HOSPITAL_BASED_OUTPATIENT_CLINIC_OR_DEPARTMENT_OTHER): Payer: 59 | Admitting: Psychiatry

## 2022-08-05 ENCOUNTER — Other Ambulatory Visit (HOSPITAL_COMMUNITY): Payer: Self-pay

## 2022-08-05 VITALS — Wt 184.0 lb

## 2022-08-05 DIAGNOSIS — F4312 Post-traumatic stress disorder, chronic: Secondary | ICD-10-CM

## 2022-08-05 DIAGNOSIS — F1210 Cannabis abuse, uncomplicated: Secondary | ICD-10-CM | POA: Diagnosis not present

## 2022-08-05 DIAGNOSIS — F39 Unspecified mood [affective] disorder: Secondary | ICD-10-CM

## 2022-08-05 MED ORDER — AMITRIPTYLINE HCL 75 MG PO TABS
75.0000 mg | ORAL_TABLET | Freq: Every day | ORAL | 0 refills | Status: DC
Start: 2022-08-05 — End: 2022-10-21
  Filled 2022-08-05: qty 30, 30d supply, fill #0

## 2022-08-05 MED ORDER — LAMOTRIGINE 100 MG PO TABS
100.0000 mg | ORAL_TABLET | Freq: Every day | ORAL | 0 refills | Status: DC
Start: 2022-08-05 — End: 2022-10-21
  Filled 2022-08-05 – 2022-09-02 (×2): qty 30, 30d supply, fill #0
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
## 2022-08-06 DIAGNOSIS — F422 Mixed obsessional thoughts and acts: Secondary | ICD-10-CM | POA: Diagnosis not present

## 2022-08-06 DIAGNOSIS — F4310 Post-traumatic stress disorder, unspecified: Secondary | ICD-10-CM | POA: Diagnosis not present

## 2022-08-06 DIAGNOSIS — F411 Generalized anxiety disorder: Secondary | ICD-10-CM | POA: Diagnosis not present

## 2022-08-06 DIAGNOSIS — F331 Major depressive disorder, recurrent, moderate: Secondary | ICD-10-CM | POA: Diagnosis not present

## 2022-09-02 ENCOUNTER — Other Ambulatory Visit: Payer: Self-pay

## 2022-09-02 ENCOUNTER — Other Ambulatory Visit (HOSPITAL_COMMUNITY): Payer: Self-pay | Admitting: Psychiatry

## 2022-09-02 ENCOUNTER — Other Ambulatory Visit (HOSPITAL_COMMUNITY): Payer: Self-pay

## 2022-09-02 DIAGNOSIS — F39 Unspecified mood [affective] disorder: Secondary | ICD-10-CM

## 2022-09-02 DIAGNOSIS — F4312 Post-traumatic stress disorder, chronic: Secondary | ICD-10-CM

## 2022-09-03 ENCOUNTER — Other Ambulatory Visit: Payer: Self-pay

## 2022-09-03 ENCOUNTER — Other Ambulatory Visit (HOSPITAL_COMMUNITY): Payer: Self-pay

## 2022-09-06 ENCOUNTER — Other Ambulatory Visit: Payer: Self-pay

## 2022-09-09 ENCOUNTER — Other Ambulatory Visit (HOSPITAL_COMMUNITY): Payer: Self-pay

## 2022-09-13 ENCOUNTER — Telehealth (HOSPITAL_COMMUNITY): Payer: 59 | Admitting: Psychiatry

## 2022-09-16 ENCOUNTER — Other Ambulatory Visit (HOSPITAL_COMMUNITY): Payer: Self-pay

## 2022-09-22 DIAGNOSIS — F411 Generalized anxiety disorder: Secondary | ICD-10-CM | POA: Diagnosis not present

## 2022-09-22 DIAGNOSIS — F422 Mixed obsessional thoughts and acts: Secondary | ICD-10-CM | POA: Diagnosis not present

## 2022-09-22 DIAGNOSIS — F331 Major depressive disorder, recurrent, moderate: Secondary | ICD-10-CM | POA: Diagnosis not present

## 2022-09-22 DIAGNOSIS — F4310 Post-traumatic stress disorder, unspecified: Secondary | ICD-10-CM | POA: Diagnosis not present

## 2022-09-25 ENCOUNTER — Other Ambulatory Visit (HOSPITAL_COMMUNITY): Payer: Self-pay

## 2022-09-29 ENCOUNTER — Ambulatory Visit: Payer: 59

## 2022-10-06 DIAGNOSIS — F411 Generalized anxiety disorder: Secondary | ICD-10-CM | POA: Diagnosis not present

## 2022-10-06 DIAGNOSIS — F422 Mixed obsessional thoughts and acts: Secondary | ICD-10-CM | POA: Diagnosis not present

## 2022-10-06 DIAGNOSIS — F4310 Post-traumatic stress disorder, unspecified: Secondary | ICD-10-CM | POA: Diagnosis not present

## 2022-10-06 DIAGNOSIS — F331 Major depressive disorder, recurrent, moderate: Secondary | ICD-10-CM | POA: Diagnosis not present

## 2022-10-13 ENCOUNTER — Ambulatory Visit (INDEPENDENT_AMBULATORY_CARE_PROVIDER_SITE_OTHER): Payer: 59 | Admitting: *Deleted

## 2022-10-13 ENCOUNTER — Other Ambulatory Visit: Payer: Self-pay

## 2022-10-13 VITALS — BP 110/75 | HR 114 | Wt 195.2 lb

## 2022-10-13 DIAGNOSIS — Z3042 Encounter for surveillance of injectable contraceptive: Secondary | ICD-10-CM | POA: Diagnosis not present

## 2022-10-13 MED ORDER — MEDROXYPROGESTERONE ACETATE 150 MG/ML IM SUSY
150.0000 mg | PREFILLED_SYRINGE | Freq: Once | INTRAMUSCULAR | Status: AC
Start: 2022-10-13 — End: 2022-10-13
  Administered 2022-10-13: 150 mg via INTRAMUSCULAR

## 2022-10-13 NOTE — Progress Notes (Deleted)
Diana Thomas here for Depo-Provera Injection. Injection administered without complication. Last dose received on 07/14/22; patient is 13w since last visit--patient within window to receive dose today. Patient will return in 3 months for next injection between 11/13 and 11/27. Next annual visit due 12/22/22.   Meryl Crutch, RN 10/13/2022  2:30 PM

## 2022-10-13 NOTE — Progress Notes (Signed)
Here for depo-provera injection. Last given 07/14/22 with exam. Last pap smear 12/21/21. Injection given without complaint and sent to desk to schedule next injection. Nancy Fetter

## 2022-10-20 DIAGNOSIS — F331 Major depressive disorder, recurrent, moderate: Secondary | ICD-10-CM | POA: Diagnosis not present

## 2022-10-20 DIAGNOSIS — F4310 Post-traumatic stress disorder, unspecified: Secondary | ICD-10-CM | POA: Diagnosis not present

## 2022-10-20 DIAGNOSIS — F422 Mixed obsessional thoughts and acts: Secondary | ICD-10-CM | POA: Diagnosis not present

## 2022-10-20 DIAGNOSIS — F411 Generalized anxiety disorder: Secondary | ICD-10-CM | POA: Diagnosis not present

## 2022-10-21 ENCOUNTER — Other Ambulatory Visit (HOSPITAL_COMMUNITY): Payer: Self-pay

## 2022-10-21 ENCOUNTER — Encounter (HOSPITAL_COMMUNITY): Payer: Self-pay | Admitting: Psychiatry

## 2022-10-21 ENCOUNTER — Telehealth (HOSPITAL_BASED_OUTPATIENT_CLINIC_OR_DEPARTMENT_OTHER): Payer: 59 | Admitting: Psychiatry

## 2022-10-21 VITALS — Wt 195.0 lb

## 2022-10-21 DIAGNOSIS — F4312 Post-traumatic stress disorder, chronic: Secondary | ICD-10-CM

## 2022-10-21 DIAGNOSIS — F1210 Cannabis abuse, uncomplicated: Secondary | ICD-10-CM

## 2022-10-21 DIAGNOSIS — F39 Unspecified mood [affective] disorder: Secondary | ICD-10-CM | POA: Diagnosis not present

## 2022-10-21 MED ORDER — LAMOTRIGINE 100 MG PO TABS
100.0000 mg | ORAL_TABLET | Freq: Every day | ORAL | 2 refills | Status: DC
  Filled 2022-10-21: qty 30, 30d supply, fill #0
  Filled 2022-11-24: qty 30, 30d supply, fill #1
  Filled 2023-01-15: qty 30, 30d supply, fill #2

## 2022-10-21 MED ORDER — AMITRIPTYLINE HCL 75 MG PO TABS
75.0000 mg | ORAL_TABLET | Freq: Every day | ORAL | 2 refills | Status: DC
  Filled 2022-10-21: qty 30, 30d supply, fill #0
  Filled 2022-11-24: qty 30, 30d supply, fill #1
  Filled 2023-01-15: qty 30, 30d supply, fill #2
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
## 2022-11-24 ENCOUNTER — Other Ambulatory Visit: Payer: Self-pay

## 2022-11-24 ENCOUNTER — Other Ambulatory Visit: Payer: Self-pay | Admitting: Family Medicine

## 2022-11-24 ENCOUNTER — Other Ambulatory Visit (HOSPITAL_COMMUNITY): Payer: Self-pay

## 2022-11-24 DIAGNOSIS — R1115 Cyclical vomiting syndrome unrelated to migraine: Secondary | ICD-10-CM

## 2022-11-24 MED ORDER — FAMOTIDINE 20 MG PO TABS
10.0000 mg | ORAL_TABLET | Freq: Two times a day (BID) | ORAL | 0 refills | Status: DC
  Filled 2022-11-24: qty 30, 30d supply, fill #0

## 2022-12-29 ENCOUNTER — Ambulatory Visit: Payer: 59

## 2023-01-16 ENCOUNTER — Other Ambulatory Visit (HOSPITAL_COMMUNITY): Payer: Self-pay

## 2023-01-17 ENCOUNTER — Other Ambulatory Visit (HOSPITAL_COMMUNITY): Payer: Self-pay

## 2023-02-03 ENCOUNTER — Telehealth: Payer: Self-pay | Admitting: Family Medicine

## 2023-02-03 NOTE — Telephone Encounter (Signed)
error 

## 2023-02-04 ENCOUNTER — Encounter: Payer: 59 | Admitting: Family Medicine

## 2023-02-07 ENCOUNTER — Other Ambulatory Visit (HOSPITAL_COMMUNITY): Payer: Self-pay | Admitting: Psychiatry

## 2023-02-07 ENCOUNTER — Other Ambulatory Visit: Payer: Self-pay | Admitting: Family Medicine

## 2023-02-07 DIAGNOSIS — F1210 Cannabis abuse, uncomplicated: Secondary | ICD-10-CM

## 2023-02-07 DIAGNOSIS — F39 Unspecified mood [affective] disorder: Secondary | ICD-10-CM

## 2023-02-07 DIAGNOSIS — R1115 Cyclical vomiting syndrome unrelated to migraine: Secondary | ICD-10-CM

## 2023-02-07 DIAGNOSIS — F4312 Post-traumatic stress disorder, chronic: Secondary | ICD-10-CM

## 2023-02-08 ENCOUNTER — Other Ambulatory Visit: Payer: Self-pay

## 2023-02-08 MED ORDER — ONDANSETRON 8 MG PO TBDP
8.0000 mg | ORAL_TABLET | Freq: Three times a day (TID) | ORAL | 0 refills | Status: DC | PRN
  Filled 2023-02-08: qty 30, 10d supply, fill #0

## 2023-02-08 MED ORDER — PANTOPRAZOLE SODIUM 40 MG PO TBEC
40.0000 mg | DELAYED_RELEASE_TABLET | Freq: Every day | ORAL | 0 refills | Status: DC
Start: 2023-02-08 — End: 2023-03-24
  Filled 2023-02-08: qty 90, 90d supply, fill #0

## 2023-02-08 MED ORDER — PROMETHAZINE HCL 12.5 MG PO TABS
12.5000 mg | ORAL_TABLET | Freq: Three times a day (TID) | ORAL | 0 refills | Status: DC | PRN
  Filled 2023-02-08: qty 30, 10d supply, fill #0

## 2023-02-08 NOTE — Telephone Encounter (Signed)
Patient needs appointment for future refills.  LOV: 06/11/2022

## 2023-02-24 ENCOUNTER — Emergency Department (HOSPITAL_BASED_OUTPATIENT_CLINIC_OR_DEPARTMENT_OTHER)
Admission: EM | Admit: 2023-02-24 | Discharge: 2023-02-24 | Disposition: A | Discharge status: Home / Self Care | Payer: 59 | Attending: Emergency Medicine | Admitting: Emergency Medicine

## 2023-02-24 ENCOUNTER — Encounter (HOSPITAL_BASED_OUTPATIENT_CLINIC_OR_DEPARTMENT_OTHER): Payer: Self-pay

## 2023-02-24 ENCOUNTER — Other Ambulatory Visit: Payer: Self-pay

## 2023-02-24 DIAGNOSIS — I10 Essential (primary) hypertension: Secondary | ICD-10-CM | POA: Insufficient documentation

## 2023-02-24 DIAGNOSIS — R1013 Epigastric pain: Secondary | ICD-10-CM | POA: Diagnosis not present

## 2023-02-24 DIAGNOSIS — R112 Nausea with vomiting, unspecified: Secondary | ICD-10-CM | POA: Insufficient documentation

## 2023-02-24 DIAGNOSIS — Z79899 Other long term (current) drug therapy: Secondary | ICD-10-CM | POA: Insufficient documentation

## 2023-02-24 LAB — CBC
HCT: 41.7 % (ref 36.0–46.0)
Hemoglobin: 13.8 g/dL (ref 12.0–15.0)
MCH: 28 pg (ref 26.0–34.0)
MCHC: 33.1 g/dL (ref 30.0–36.0)
MCV: 84.6 fL (ref 80.0–100.0)
Platelets: 143 10*3/uL — ABNORMAL LOW (ref 150–400)
RBC: 4.93 MIL/uL (ref 3.87–5.11)
RDW: 13.3 % (ref 11.5–15.5)
WBC: 13.9 10*3/uL — ABNORMAL HIGH (ref 4.0–10.5)
nRBC: 0 % (ref 0.0–0.2)

## 2023-02-24 LAB — COMPREHENSIVE METABOLIC PANEL
ALT: 27 U/L (ref 0–44)
AST: 34 U/L (ref 15–41)
Albumin: 4.8 g/dL (ref 3.5–5.0)
Alkaline Phosphatase: 56 U/L (ref 38–126)
Anion gap: 10 (ref 5–15)
BUN: 12 mg/dL (ref 6–20)
CO2: 23 mmol/L (ref 22–32)
Calcium: 9.6 mg/dL (ref 8.9–10.3)
Chloride: 104 mmol/L (ref 98–111)
Creatinine, Ser: 0.86 mg/dL (ref 0.44–1.00)
GFR, Estimated: >60 mL/min (ref >60)
Glucose, Bld: 126 mg/dL — ABNORMAL HIGH (ref 70–99)
Potassium: 3.9 mmol/L (ref 3.5–5.1)
Sodium: 137 mmol/L (ref 135–145)
Total Bilirubin: 0.8 mg/dL (ref 0.0–1.2)
Total Protein: 8.4 g/dL — ABNORMAL HIGH (ref 6.5–8.1)

## 2023-02-24 LAB — MAGNESIUM: Magnesium: 2.1 mg/dL (ref 1.7–2.4)

## 2023-02-24 LAB — LIPASE, BLOOD: Lipase: 34 U/L (ref 11–51)

## 2023-02-24 MED ORDER — SODIUM CHLORIDE 0.9 % IV BOLUS
1000.0000 mL | Freq: Once | INTRAVENOUS | Status: AC
  Administered 2023-02-24: 1000 mL via INTRAVENOUS

## 2023-02-24 MED ORDER — FAMOTIDINE IN NACL 20-0.9 MG/50ML-% IV SOLN
20.0000 mg | Freq: Once | INTRAVENOUS | Status: AC
  Administered 2023-02-24: 20 mg via INTRAVENOUS
  Filled 2023-02-24: qty 50

## 2023-02-24 MED ORDER — DROPERIDOL 2.5 MG/ML IJ SOLN
1.2500 mg | Freq: Once | INTRAMUSCULAR | Status: AC
  Administered 2023-02-24: 1.25 mg via INTRAVENOUS
  Filled 2023-02-24: qty 2

## 2023-02-24 NOTE — Discharge Instructions (Signed)
 You have been seen today for your complaint of nausea and vomiting. Your lab work was reassuring. Home care instructions are as follows:  Discontinue marijuana use. Follow up with: your PCP Please seek immediate medical care if you develop any of the following symptoms: You cannot stop vomiting. You have blood in your vomit or your vomit looks like coffee grounds. You have severe abdominal pain. You have stools that are bloody or black, or stools that look like tar. You have symptoms of dehydration, such as: Sunken eyes. Inability to make tears. Cracked lips or dry mouth. Decreased urine production. Weakness. Sleepiness. Dizziness, light-headedness, or fainting. At this time there does not appear to be the presence of an emergent medical condition, however there is always the potential for conditions to change. Please read and follow the below instructions.  Do not take your medicine if  develop an itchy rash, swelling in your mouth or lips, or difficulty breathing; call 911 and seek immediate emergency medical attention if this occurs.  You may review your lab tests and imaging results in their entirety on your MyChart account.  Please discuss all results of fully with your primary care provider and other specialist at your follow-up visit.  Note: Portions of this text may have been transcribed using voice recognition software. Every effort was made to ensure accuracy; however, inadvertent computerized transcription errors may still be present.

## 2023-02-24 NOTE — ED Provider Notes (Signed)
 Accepted handoff at shift change from [facility]. Please see prior provider note for more detail.   Briefly: Patient is 28 y.o.   DDX: concern for Hx of cyclic vomiting, cannabis hyperemesis, 2 episodes of vomiting, multiple episodes of emesis. Not vomiting blood. Reassess, discharge.  Plan: After fluids, nausea medication patient feeling improved and is stable for discharge, she is tolerating p.o. at this time.     Rosan Sherlean VEAR DEVONNA 02/24/23 1952    Armenta Canning, MD 02/24/23 2209

## 2023-02-24 NOTE — ED Provider Notes (Signed)
 [place] [facility] AT [facility] Provider Note   CSN: [id number] Arrival date & time: 02/24/23  1701     History  Chief Complaint  Patient presents with   Emesis    Diana Thomas is a 28 y.o. female.  With a history of anxiety, depression, GERD, cannabis hyperemesis syndrome, hypertension presenting to the ED for evaluation of nausea and vomiting.  Symptoms began 2 days ago.  She believes her symptoms are secondary to a chicken sandwich that she ate the day prior.  She reports 2 episodes of emesis and multiple episodes of dry heaving today.  No diarrhea.  No fevers or chills.  She reports mild epigastric abdominal pain as well.  Describes this as a burning sensation.  Reports a significant history of gastritis.  She has tried amitriptyline , Zofran  and Phenergan  at home with minimal improvement in her symptoms.  No chest pain or shortness of breath.  She reports frequent marijuana use.  Last use was yesterday.  She denies any cigarette use.  She reports social alcohol use.  No other recreational drug use.  No dysuria, frequency, urgency, pelvic pain, vaginal complaints. Emesis is non bloody non bilious.    Emesis Associated symptoms: abdominal pain        Home Medications Prior to Admission medications   Medication Sig Start Date End Date Taking? Authorizing Provider  amitriptyline  (ELAVIL ) 75 MG tablet Take 1 tablet (75 mg total) by mouth daily. 10/21/22   Arfeen, Leni DASEN, MD  calcium  carbonate (OS-CAL) 1250 (500 Ca) MG chewable tablet Chew 1 tablet (1,250 mg total) by mouth daily. 07/16/22   Anyanwu, Ugonna A, MD  famotidine  (PEPCID ) 20 MG tablet Take 0.5 tablets (10 mg total) by mouth 2 (two) times daily. 11/24/22 12/25/22  Sebastian Beverley NOVAK, MD  hydrochlorothiazide  (HYDRODIURIL ) 25 MG tablet Take 1 tablet (25 mg total) by mouth daily. 07/09/22 10/11/22  Sebastian Beverley NOVAK, MD  lamoTRIgine  (LAMICTAL ) 100 MG tablet Take 1 tablet (100 mg) by mouth daily. 10/21/22 02/16/23   Arfeen, Syed T, MD  metFORMIN  (GLUCOPHAGE ) 500 MG tablet Take 1 tablet (500 mg total) by mouth daily with breakfast. 06/14/22 07/14/22  Sebastian Beverley NOVAK, MD  ondansetron  (ZOFRAN -ODT) 8 MG disintegrating tablet Dissolve 1 tablet by mouth every 8 hours as needed for nausea or vomiting. 02/08/23   Sebastian Beverley NOVAK, MD  pantoprazole  (PROTONIX ) 40 MG tablet Take 1 tablet (40 mg total) by mouth daily. 02/08/23   Sebastian Beverley NOVAK, MD  promethazine  (PHENERGAN ) 12.5 MG tablet Take 1 tablet by mouth every 8 hours as needed for nausea or vomiting. 02/08/23   Sebastian Beverley NOVAK, MD  SUMAtriptan  (IMITREX ) 20 MG/ACT nasal spray Place 1 spray into the nose at onset as needed for migraine or headache. May repeat in 2 hours if abdominal pain/vomiting persists or recurs. Do not take more than 2 doses in a day and 6 doses in a week 01/21/22   Sebastian Beverley NOVAK, MD  triamcinolone  ointment (KENALOG ) 0.5 % Apply 1 application topically 2 (two) times daily. For moderate to severe eczema.  Do not use for more than 1 week at a time. 06/11/22   Sebastian Beverley NOVAK, MD  Vitamin D , Ergocalciferol , 50000 units CAPS Take 1 capsule by mouth once a week. 07/16/22   Anyanwu, Ugonna A, MD      Allergies    Patient has no known allergies.    Review of Systems   Review of Systems  Gastrointestinal:  Positive for  abdominal pain, nausea and vomiting.  All other systems reviewed and are negative.   Physical Exam Updated Vital Signs BP 129/77 (BP Location: Left Arm)   Pulse (!) 109   Temp 98.8 F (37.1 C)   Resp 20   SpO2 99%  Physical Exam Vitals and nursing note reviewed.  Constitutional:      General: She is not in acute distress.    Appearance: Normal appearance. She is normal weight. She is not ill-appearing.     Comments: Resting comfortably in bed  HENT:     Head: Normocephalic and atraumatic.     Mouth/Throat:     Mouth: Mucous membranes are dry.  Pulmonary:     Effort: Pulmonary effort is normal. No respiratory  distress.  Abdominal:     General: Abdomen is flat.     Tenderness: There is abdominal tenderness (Epigastric). There is no guarding.  Musculoskeletal:        General: Normal range of motion.     Cervical back: Neck supple.  Skin:    General: Skin is warm and dry.  Neurological:     Mental Status: She is alert and oriented to person, place, and time.  Psychiatric:        Mood and Affect: Mood normal.        Behavior: Behavior normal.     ED Results / Procedures / Treatments   Labs (all labs ordered are listed, but only abnormal results are displayed) Labs Reviewed  COMPREHENSIVE METABOLIC PANEL - Abnormal; Notable for the following components:      Result Value   Glucose, Bld 126 (*)    Total Protein 8.4 (*)    All other components within normal limits  CBC - Abnormal; Notable for the following components:   WBC 13.9 (*)    Platelets 143 (*)    All other components within normal limits  LIPASE, BLOOD  URINALYSIS, ROUTINE W REFLEX MICROSCOPIC  PREGNANCY, URINE  MAGNESIUM    EKG None  Radiology No results found.  Procedures Procedures    Medications Ordered in ED Medications  famotidine  (PEPCID ) IVPB 20 mg premix (20 mg Intravenous New Bag/Given 02/24/23 1840)  droperidol  (INAPSINE ) 2.5 MG/ML injection 1.25 mg (1.25 mg Intravenous Given 02/24/23 1836)  sodium chloride  0.9 % bolus 1,000 mL (1,000 mLs Intravenous New Bag/Given 02/24/23 1842)    ED Course/ Medical Decision Making/ A&P Clinical Course as of 02/24/23 1855  Thu Feb 24, 2023  1851 Hx of cyclic vomiting, cannabis hyperemesis, 2 episodes of vomiting, multiple episodes of emesis. Not vomiting blood. Reassess, discharge. [CP]    Clinical Course User Index [CP] Prosperi, Sherlean DEL, PA-C                                 Medical Decision Making Amount and/or Complexity of Data Reviewed Labs: ordered.  Risk Prescription drug management.  This patient presents to the ED for concern of nausea, vomiting,  this involves an extensive number of treatment options, and is a complaint that carries with it a high risk of complications and morbidity. The emergent differential diagnosis for vomiting includes, but is not limited to ACS/MI, DKA, Ischemic bowel, Meningitis, Sepsis, Acute gastric dilation, Adrenal insufficiency, Appendicitis,  Bowel obstruction/ileus, Carbon monoxide poisoning, Cholecystitis, Electrolyte abnormalities, Elevated ICP, Gastric outlet obstruction, Pancreatitis, Ruptured viscus, Biliary colic, Cannabinoid hyperemesis syndrome, Gastritis, Gastroenteritis, Gastroparesis,  Narcotic withdrawal, Peptic ulcer disease, and UTI   My  initial workup includes labs, fluids, antiemetic  Additional history obtained from: Nursing notes from this visit. Family mother at bedside provides a portion of the history  I ordered, reviewed and interpreted labs which include: CBC, CMP, lipase, magnesium, urinalysis, urine pregnancy.  Leukocytosis of 13.9.  No anemia.  No significant electrolyte derangement or kidney dysfunction.  Normal lipase.  Afebrile, hemodynamically stable.  28 year old female presenting to the ED for evaluation of nausea, vomiting and epigastric abdominal pain.  Symptoms began 2 days ago.  She attributes this to potentially eating a bad chicken sandwich, however she denies any diarrhea.  She reports frequent marijuana use, most recently yesterday.  Overall suspect cannabis hyperemesis.  No significant kidney dysfunction. No significant electrolyte derangement. Pain is in the epigastric region.  She reports extensive history of gastritis. Plan at shift change is to reassess after treatment and lab results. Will need p.o. trial. Likely discharge home if trial is successful. Patient has zofran  and phenergan  at home. Care handed off to oncoming provider. Plan may change at the discretion of the oncoming provider. Please see their note for final disposition and decision making.  Note: Portions of  this report may have been transcribed using voice recognition software. Every effort was made to ensure accuracy; however, inadvertent computerized transcription errors may still be present.         Final Clinical Impression(s) / ED Diagnoses Final diagnoses:  Nausea and vomiting, unspecified vomiting type    Rx / DC Orders ED Discharge Orders     None         Edwardo Marsa HERO, DEVONNA 02/24/23 DANIAL Armenta Canning, MD 02/24/23 2209

## 2023-02-24 NOTE — ED Triage Notes (Signed)
 Pt has been vomiting since Tuesday, denies any diarrhea, having some abdominal pain.

## 2023-02-24 NOTE — ED Notes (Signed)
 Pt unable to void at this time for urine sample.   Robina Ade, RN

## 2023-02-28 ENCOUNTER — Telehealth: Payer: Self-pay

## 2023-02-28 NOTE — Transitions of Care (Post Inpatient/ED Visit) (Signed)
   02/28/2023  Name: KYM SCANNELL MRN: [id number] DOB: 03/31/1995  Today's TOC FU Call Status: Today's TOC FU Call Status:: Unsuccessful Call (1st Attempt) Unsuccessful Call (1st Attempt) Date: 02/28/23  Attempted to reach the patient regarding the most recent Inpatient/ED visit.  Follow Up Plan: Additional outreach attempts will be made to reach the patient to complete the Transitions of Care (Post Inpatient/ED visit) call.   Signature Dedra Sorenson, BSN, [facility]

## 2023-03-02 ENCOUNTER — Other Ambulatory Visit: Payer: Self-pay | Admitting: Family Medicine

## 2023-03-02 DIAGNOSIS — R1115 Cyclical vomiting syndrome unrelated to migraine: Secondary | ICD-10-CM

## 2023-03-02 NOTE — Transitions of Care (Post Inpatient/ED Visit) (Signed)
 03/02/2023  Name: Diana Thomas MRN: [id number] DOB: 06-Jan-1996  Today's TOC FU Call Status: Today's TOC FU Call Status:: Successful TOC FU Call Completed Unsuccessful Call (1st Attempt) Date: 02/28/23 [facility] FU Call Complete Date: 03/02/23 Patient's Name and Date of Birth confirmed.  Transition Care Management Follow-up Telephone Call Date of Discharge: 02/24/23 Discharge Facility: [facility] Type of Discharge: Emergency Department How have you been since you were released from the hospital?: Better Any questions or concerns?: No  Items Reviewed: Did you receive and understand the discharge instructions provided?: Yes Medications obtained,verified, and reconciled?: Yes (Medications Reviewed) Any new allergies since your discharge?: No Dietary orders reviewed?: Yes Do you have support at home?: Yes  Medications Reviewed Today: Medications Reviewed Today     Reviewed by Karlene Overcast, RN (Registered Nurse) on 03/02/23 at 1418  Med List Status: <None>   Medication Order Taking? Sig Documenting Provider Last Dose Status Informant  amitriptyline  (ELAVIL ) 75 MG tablet [id number] Yes Take 1 tablet (75 mg total) by mouth daily. Arfeen, Syed T, MD Taking Active   calcium  carbonate (OS-CAL) 1250 (500 Ca) MG chewable tablet [id number] Yes Chew 1 tablet (1,250 mg total) by mouth daily. Anyanwu, Ugonna A, MD Taking Active   famotidine  (PEPCID ) 20 MG tablet [id number]  Take 0.5 tablets (10 mg total) by mouth 2 (two) times daily. Catheryn Cluck, MD  Expired 12/25/22 2359   hydrochlorothiazide  (HYDRODIURIL ) 25 MG tablet [id number]  Take 1 tablet (25 mg total) by mouth daily. Catheryn Cluck, MD  Expired 10/11/22 2359   lamoTRIgine  (LAMICTAL ) 100 MG tablet [id number]  Take 1 tablet (100 mg) by mouth daily. Arfeen, Syed T, MD  Expired 02/16/23 2359   metFORMIN  (GLUCOPHAGE ) 500 MG tablet [id number]  Take 1 tablet (500 mg total) by mouth daily with breakfast. Catheryn Cluck, MD   Expired 07/14/22 2359   ondansetron  (ZOFRAN -ODT) 8 MG disintegrating tablet [id number] Yes Dissolve 1 tablet by mouth every 8 hours as needed for nausea or vomiting. Catheryn Cluck, MD Taking Active   pantoprazole  (PROTONIX ) 40 MG tablet [id number] Yes Take 1 tablet (40 mg total) by mouth daily. Catheryn Cluck, MD Taking Active   promethazine  (PHENERGAN ) 12.5 MG tablet [id number] Yes Take 1 tablet by mouth every 8 hours as needed for nausea or vomiting. Catheryn Cluck, MD Taking Active   SUMAtriptan  (IMITREX ) 20 MG/ACT nasal spray [id number] Yes Place 1 spray into the nose at onset as needed for migraine or headache. May repeat in 2 hours if abdominal pain/vomiting persists or recurs. Do not take more than 2 doses in a day and 6 doses in a week Catheryn Cluck, MD Taking Active   triamcinolone  ointment (KENALOG ) 0.5 % [id number] Yes Apply 1 application topically 2 (two) times daily. For moderate to severe eczema.  Do not use for more than 1 week at a time. Catheryn Cluck, MD Taking Active   Vitamin D , Ergocalciferol , 50000 units CAPS [id number] Yes Take 1 capsule by mouth once a week. Julianne Octave, MD Taking Active             Home Care and Equipment/Supplies: Were Home Health Services Ordered?: NA Any new equipment or medical supplies ordered?: NA  Functional Questionnaire: Do you need assistance with bathing/showering or dressing?: No Do you need assistance with meal preparation?: No Do you need assistance with eating?: No Do you have difficulty maintaining continence: No Do you need assistance with getting out of bed/getting out  of a chair/moving?: No Do you have difficulty managing or taking your medications?: No  Follow up appointments reviewed: PCP Follow-up appointment confirmed?: No Specialist Hospital Follow-up appointment confirmed?: No Do you need transportation to your follow-up appointment?: No Do you understand care options if your condition(s) worsen?:  Yes-patient verbalized understanding    SIGNATURE Alphonsa Jasper, BSN, RN

## 2023-03-02 NOTE — Telephone Encounter (Signed)
 Copied from CRM [id number]. Topic: Clinical - Medication Refill >> Mar 02, 2023  9:35 AM Crispin Dolphin wrote: Most Recent Primary Care Visit:  Provider: Catheryn Cluck  Department: [facility]  Visit Type: OFFICE VISIT  Date: 06/11/2022  Medication:   Has the patient contacted their pharmacy?  (Agent: If no, request that the patient contact the pharmacy for the refill. If patient does not wish to contact the pharmacy document the reason why and proceed with request.) (Agent: If yes, when and what did the pharmacy advise?)  Is this the correct pharmacy for this prescription?  If no, delete pharmacy and type the correct one.  This is the patient's preferred pharmacy:  [facility] [address] [place] [address] Phone: [phone] Fax: [phone]   Has the prescription been filled recently?   Is the patient out of the medication?   Has the patient been seen for an appointment in the last year OR does the patient have an upcoming appointment?   Can we respond through MyChart?   Agent: Please be advised that Rx refills may take up to 3 business days. We ask that you follow-up with your pharmacy.

## 2023-03-03 ENCOUNTER — Ambulatory Visit: Payer: 59 | Admitting: Family Medicine

## 2023-03-03 ENCOUNTER — Other Ambulatory Visit: Payer: Self-pay

## 2023-03-03 MED ORDER — SUMATRIPTAN 20 MG/ACT NA SOLN
20.0000 mg | NASAL | 6 refills | Status: DC | PRN
  Filled 2023-03-03: qty 6, 15d supply, fill #0

## 2023-03-07 NOTE — Telephone Encounter (Signed)
I called and left patient a detailed message that Rx was refilled and that patient will need an appointment for future refills.

## 2023-03-16 ENCOUNTER — Other Ambulatory Visit: Payer: Self-pay

## 2023-03-17 ENCOUNTER — Other Ambulatory Visit: Payer: Self-pay

## 2023-03-18 ENCOUNTER — Other Ambulatory Visit (HOSPITAL_COMMUNITY): Payer: Self-pay

## 2023-03-24 ENCOUNTER — Encounter: Payer: Self-pay | Admitting: Family Medicine

## 2023-03-24 ENCOUNTER — Other Ambulatory Visit: Payer: Self-pay

## 2023-03-24 ENCOUNTER — Ambulatory Visit: Payer: 59 | Admitting: Family Medicine

## 2023-03-24 ENCOUNTER — Other Ambulatory Visit (HOSPITAL_COMMUNITY)
Admission: RE | Admit: 2023-03-24 | Discharge: 2023-03-24 | Disposition: A | Discharge status: Home / Self Care | Payer: 59 | Source: Ambulatory Visit | Attending: Family Medicine | Admitting: Family Medicine

## 2023-03-24 ENCOUNTER — Other Ambulatory Visit (HOSPITAL_COMMUNITY): Payer: Self-pay

## 2023-03-24 VITALS — BP 118/78 | HR 76 | Temp 97.2°F | Wt 190.2 lb

## 2023-03-24 DIAGNOSIS — B9689 Other specified bacterial agents as the cause of diseases classified elsewhere: Secondary | ICD-10-CM

## 2023-03-24 DIAGNOSIS — Z113 Encounter for screening for infections with a predominantly sexual mode of transmission: Secondary | ICD-10-CM | POA: Diagnosis not present

## 2023-03-24 DIAGNOSIS — F1210 Cannabis abuse, uncomplicated: Secondary | ICD-10-CM

## 2023-03-24 DIAGNOSIS — F4312 Post-traumatic stress disorder, chronic: Secondary | ICD-10-CM

## 2023-03-24 DIAGNOSIS — L819 Disorder of pigmentation, unspecified: Secondary | ICD-10-CM | POA: Diagnosis not present

## 2023-03-24 DIAGNOSIS — R1115 Cyclical vomiting syndrome unrelated to migraine: Secondary | ICD-10-CM

## 2023-03-24 DIAGNOSIS — F39 Unspecified mood [affective] disorder: Secondary | ICD-10-CM | POA: Diagnosis not present

## 2023-03-24 DIAGNOSIS — N760 Acute vaginitis: Secondary | ICD-10-CM

## 2023-03-24 MED ORDER — TRIAMCINOLONE ACETONIDE 0.5 % EX OINT
1.0000 | TOPICAL_OINTMENT | Freq: Two times a day (BID) | CUTANEOUS | 3 refills | Status: DC
  Filled 2023-03-24: qty 60, 30d supply, fill #0

## 2023-03-24 MED ORDER — SUMATRIPTAN 20 MG/ACT NA SOLN
20.0000 mg | NASAL | 6 refills | Status: DC | PRN
  Filled 2023-03-24: qty 6, 30d supply, fill #0

## 2023-03-24 MED ORDER — ONDANSETRON 8 MG PO TBDP
8.0000 mg | ORAL_TABLET | Freq: Three times a day (TID) | ORAL | 0 refills | Status: AC | PRN
  Filled 2023-03-24: qty 30, 10d supply, fill #0

## 2023-03-24 MED ORDER — PROMETHAZINE HCL 12.5 MG PO TABS
12.5000 mg | ORAL_TABLET | Freq: Three times a day (TID) | ORAL | 0 refills | Status: AC | PRN
  Filled 2023-03-24: qty 30, 10d supply, fill #0

## 2023-03-24 MED ORDER — FAMOTIDINE 20 MG PO TABS
10.0000 mg | ORAL_TABLET | Freq: Two times a day (BID) | ORAL | 0 refills | Status: AC
  Filled 2023-03-24: qty 30, 30d supply, fill #0

## 2023-03-24 MED ORDER — PANTOPRAZOLE SODIUM 40 MG PO TBEC
40.0000 mg | DELAYED_RELEASE_TABLET | Freq: Every day | ORAL | 0 refills | Status: AC
  Filled 2023-03-24: qty 90, 90d supply, fill #0

## 2023-03-24 NOTE — Patient Instructions (Signed)
 VISIT SUMMARY:  Diana Thomas, a 28 year old female, visited for medication refills and STD screening.   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 It is currently managed with Zofran  and Phenergan  as needed, and daily Protonix  and Pepcid  for reflux. You will continue these medications and follow up with Dr. Patsey in gastroenterology for potential new treatments.  -MOOD DISORDER (PTSD, ANXIETY, DEPRESSION): PTSD, anxiety, and depression are mental health conditions that can affect your mood and overall well-being. You are currently taking amitriptyline , which helps with your symptoms. You should discuss your mood management with Dr. Rosendo to consider any necessary adjustments or alternative treatments.  -GENERAL HEALTH MAINTENANCE: Your blood pressure is well-controlled, and you have experienced recent weight loss. Your recent lab work showed stable kidney and liver function, no anemia, and a slightly elevated white count due to stress. We will order fasting lab work in six months and perform a comprehensive STI screening today.  INSTRUCTIONS:  Please schedule a follow-up appointment with gastroenterology and coordinate with Dr. Rosendo for mood management. Additionally, we will perform STI screening today, and you should have fasting lab work done in six months.

## 2023-03-24 NOTE — Progress Notes (Addendum)
 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 Uses Zofran  and Phenergan  as needed, pantoprazole  daily, and famotidine  almost daily for reflux. Recently required emergency care. No current cannabis use. Discussed potential gastroenterology referral for newer agents beyond tricyclics. - Continue Zofran  as needed - Continue Phenergan  as needed - Continue pantoprazole  daily - Continue famotidine  daily - Refer to gastroenterology for follow-up   Mood Disorder (PTSD, Anxiety, Depression) PTSD with elevated anxiety and mildly elevated depression scores. Currently on amitriptyline , which helps with mood and chronic nausea/vomiting. Previously stopped but resumed after symptom worsening. Not taking lamotrigine  regularly. Discussed contacting Dr. Rosendo for mood management and potential treatment adjustments. - Continue amitriptyline  - Discuss mood management with Dr. Arfeen for potential adjustments or alternative treatments  General Health Maintenance Blood pressure well-controlled, weight loss noted. Recent ED labs showed stable kidney and liver function, CBC without anemia, and slightly elevated white count due to stress. Last fasting lab work in April 2024. - Order fasting lab work in six months - Perform STI screening including gonorrhea, chlamydia, BV, yeast, trichomonas, HIV, syphilis, hepatitis B, and hepatitis C  Bacterial vaginosis Found on cervical swab Treat with metronidazole  500 mg twice daily for 7 days Follow-up if no improvement       Medications Discontinued During This Encounter  Medication Reason   metFORMIN  (GLUCOPHAGE ) 500 MG tablet    hydrochlorothiazide  (HYDRODIURIL ) 25 MG tablet    triamcinolone  ointment (KENALOG ) 0.5 % Reorder   famotidine  (PEPCID ) 20 MG tablet Reorder   ondansetron  (ZOFRAN -ODT) 8 MG disintegrating tablet Reorder   pantoprazole   (PROTONIX ) 40 MG tablet Reorder   promethazine  (PHENERGAN ) 12.5 MG tablet Reorder   SUMAtriptan  (IMITREX ) 20 MG/ACT nasal spray Reorder    Return in about 6 months (around 09/21/2023) for physical (fasting labs).    Subjective:   Encounter date: 03/24/2023  Diana Thomas is a 28 y.o. female who has Generalized anxiety disorder; Moderate episode of recurrent major depressive disorder (HCC); Gastroesophageal reflux disease without esophagitis; Cannabinoid hyperemesis syndrome; Cyclical vomiting; Hypokalemia; Intractable vomiting; Nausea & vomiting; Acute esophagitis; Acute gastritis without hemorrhage; Acute upper respiratory infection; Constipation; Current moderate episode of major depressive disorder without prior episode (HCC); Other atopic dermatitis and related conditions; Hypertension; and Hyperpigmentation of skin on their problem list..   She  has a past medical history of Allergy, Anemia, Anxiety, Cannabinoid hyperemesis syndrome, Depression, Esophagitis, Gastritis, GERD (gastroesophageal reflux disease), Heart murmur, and Hypertension (12/21/2021)..   She presents with chief complaint of Medical Management of Chronic Issues (Medication refill. STD screening with HIV blood work. ) .  Discussed the use of AI scribe software for clinical note transcription with the patient, who gave verbal consent to proceed.  History of Present Illness   Diana Thomas is a 28 year old female with cyclical vomiting syndrome secondary to cannabis use who presents for medication refills and STD screening.  She has a history of cyclical vomiting syndrome secondary to cannabis use, experiencing chronic nausea and vomiting. She manages these symptoms with Zofran  a couple of times a week and Phenergan  when she feels she is about to vomit. She also experiences significant reflux, for which she takes Protonix  daily and Pepcid  almost every day. A recent illness led to an emergency department visit, during which  she was not taking amitriptyline , but she has since resumed it as it helps with her symptoms.  She acknowledges that  cannabis use can trigger her symptoms but states she is not using it much anymore. She has a history of PTSD and has been on amitriptyline  and lamotrigine  for mood management, though she stopped taking lamotrigine  as it was not effective. She reports elevated anxiety and mildly elevated depression scores.  She uses Imitrex  for vomiting, although she rarely needs it, having used it only once in the past year during a recent illness. Her blood pressure is well controlled, and she has lost some weight recently. Lab work from the emergency department showed stable kidney and liver function, no anemia, and a slightly elevated white count under stress.  She is here for STD screening and reports no current symptoms.          03/24/2023    2:34 PM 07/14/2022    9:53 AM 03/15/2022   10:28 AM 01/21/2022   10:20 AM 12/21/2021    9:10 AM  Depression screen PHQ 2/9  Decreased Interest 1 1  2 1   Down, Depressed, Hopeless 1 1  1 1   PHQ - 2 Score 2 2  3 2   Altered sleeping 2 1  1 1   Tired, decreased energy 2 2  2 1   Change in appetite 2 2  3 2   Feeling bad or failure about yourself  1 2  1 1   Trouble concentrating 0 2  0 0  Moving slowly or fidgety/restless 0 0  0 0  Suicidal thoughts 0 0  1 0  PHQ-9 Score 9 11  11 7   Difficult doing work/chores Not difficult at all   Somewhat difficult      Information is confidential and restricted. Go to Review Flowsheets to unlock data.      03/24/2023    2:35 PM 07/14/2022    9:53 AM 03/15/2022   10:29 AM 01/21/2022   10:20 AM  GAD 7 : Generalized Anxiety Score  Nervous, Anxious, on Edge 3 3  3   Control/stop worrying 3 3  3   Worry too much - different things 3 3  3   Trouble relaxing 3 3  3   Restless 1 2  1   Easily annoyed or irritable 3 3  3   Afraid - awful might happen 3 2  3   Total GAD 7 Score 19 19  19   Anxiety Difficulty Not difficult at  all   Very difficult     Information is confidential and restricted. Go to Review Flowsheets to unlock data.      Past Surgical History:  Procedure Laterality Date   BIOPSY  07/23/2020   Procedure: BIOPSY;  Surgeon: Albertus Gordy HERO, MD;  Location: [facility];  Service: Gastroenterology;;   ESOPHAGOGASTRODUODENOSCOPY (EGD) WITH PROPOFOL  N/A 07/23/2020   Procedure: ESOPHAGOGASTRODUODENOSCOPY (EGD) WITH PROPOFOL ;  Surgeon: Albertus Gordy HERO, MD;  Location: [facility];  Service: Gastroenterology;  Laterality: N/A;    Outpatient Medications Prior to Visit  Medication Sig Dispense Refill   amitriptyline  (ELAVIL ) 75 MG tablet Take 1 tablet (75 mg total) by mouth daily. 30 tablet 2   calcium  carbonate (OS-CAL) 1250 (500 Ca) MG chewable tablet Chew 1 tablet (1,250 mg total) by mouth daily. 90 tablet 1   Vitamin D , Ergocalciferol , 50000 units CAPS Take 1 capsule by mouth once a week. 12 capsule 1   ondansetron  (ZOFRAN -ODT) 8 MG disintegrating tablet Dissolve 1 tablet by mouth every 8 hours as needed for nausea or vomiting. 30 tablet 0   pantoprazole  (PROTONIX ) 40 MG tablet Take 1 tablet (40 mg total) by  mouth daily. 90 tablet 0   promethazine  (PHENERGAN ) 12.5 MG tablet Take 1 tablet by mouth every 8 hours as needed for nausea or vomiting. 30 tablet 0   SUMAtriptan  (IMITREX ) 20 MG/ACT nasal spray Place 1 spray into the nose at onset as needed for migraine or headache. May repeat in 2 hours if abdominal pain/vomiting persists or recurs. Do not take more than 2 doses in a day and 6 doses in a week 6 each 6   triamcinolone  ointment (KENALOG ) 0.5 % Apply 1 application topically 2 (two) times daily. For moderate to severe eczema.  Do not use for more than 1 week at a time. 60 g 3   lamoTRIgine  (LAMICTAL ) 100 MG tablet Take 1 tablet (100 mg) by mouth daily. 30 tablet 2   famotidine  (PEPCID ) 20 MG tablet Take 0.5 tablets (10 mg total) by mouth 2 (two) times daily. 30 tablet 0   hydrochlorothiazide   (HYDRODIURIL ) 25 MG tablet Take 1 tablet (25 mg total) by mouth daily. 90 tablet 0   metFORMIN  (GLUCOPHAGE ) 500 MG tablet Take 1 tablet (500 mg total) by mouth daily with breakfast. 30 tablet 0   No facility-administered medications prior to visit.    Family History  Problem Relation Age of Onset   Heart disease Mother    Hypertension Mother    Hyperlipidemia Mother    ADD / ADHD Father    Healthy Father    Healthy Sister    Liver disease Maternal Grandmother    Diabetes Maternal Uncle    Colon cancer Neg Hx    Esophageal cancer Neg Hx    Stomach cancer Neg Hx    Rectal cancer Neg Hx    Pancreatic cancer Neg Hx     Social History   Socioeconomic History   Marital status: Single    Spouse name: Not on file   Number of children: 0   Years of education: Not on file   Highest education level: Not on file  Occupational History   Occupation: RN  Tobacco Use   Smoking status: Never    Passive exposure: Never   Smokeless tobacco: Never  Vaping Use   Vaping status: Never Used  Substance and Sexual Activity   Alcohol use: Yes    Alcohol/week: 2.0 standard drinks of alcohol    Types: 2 Glasses of wine per week    Comment: Occasionally   Drug use: Yes    Types: Marijuana    Comment: daily   Sexual activity: Yes  Other Topics Concern   Not on file  Social History Narrative   Not on file   Social Drivers of Health   Financial Resource Strain: Not on file  Food Insecurity: No Food Insecurity (12/21/2021)   Hunger Vital Sign    Worried About Running Out of Food in the Last Year: Never true    Ran Out of Food in the Last Year: Never true  Transportation Needs: No Transportation Needs (12/21/2021)   PRAPARE - Administrator, Civil Service (Medical): No    Lack of Transportation (Non-Medical): No  Physical Activity: Not on file  Stress: Not on file  Social Connections: Not on file  Intimate Partner Violence: Not on file  Objective:  Physical Exam: BP 118/78   Pulse 76   Temp (!) 97.2 F (36.2 C) (Temporal)   Wt 190 lb 3.2 oz (86.3 kg)   LMP 02/04/2023   SpO2 99%   BMI 33.16 kg/m     Physical Exam Constitutional:      General: She is not in acute distress.    Appearance: Normal appearance. She is not ill-appearing or toxic-appearing.  HENT:     Head: Normocephalic and atraumatic.     Nose: Nose normal. No congestion.  Eyes:     General: No scleral icterus.    Extraocular Movements: Extraocular movements intact.  Cardiovascular:     Rate and Rhythm: Normal rate and regular rhythm.     Pulses: Normal pulses.     Heart sounds: Normal heart sounds.  Pulmonary:     Effort: Pulmonary effort is normal. No respiratory distress.     Breath sounds: Normal breath sounds.  Abdominal:     General: Abdomen is flat. Bowel sounds are normal.     Palpations: Abdomen is soft.  Musculoskeletal:        General: Normal range of motion.  Lymphadenopathy:     Cervical: No cervical adenopathy.  Skin:    General: Skin is warm and dry.     Findings: No rash.  Neurological:     General: No focal deficit present.     Mental Status: She is alert and oriented to person, place, and time. Mental status is at baseline.  Psychiatric:        Behavior: Behavior normal.     No results found.  Recent Results (from the past 2160 hours)  Lipase, blood     Status: None   Collection Time: 02/24/23  5:16 PM  Result Value Ref Range   Lipase 34 11 - 51 U/L    Comment: Performed at [facility], [address] [place], [place] [address]  Comprehensive metabolic panel     Status: Abnormal   Collection Time: 02/24/23  5:16 PM  Result Value Ref Range   Sodium 137 135 - 145 mmol/L   Potassium 3.9 3.5 - 5.1 mmol/L    Comment: HEMOLYSIS AT THIS LEVEL MAY AFFECT RESULT   Chloride 104 98 - 111 mmol/L   CO2 23 22 - 32 mmol/L   Glucose, Bld 126 (H) 70 -  99 mg/dL    Comment: Glucose reference range applies only to samples taken after fasting for at least 8 hours.   BUN 12 6 - 20 mg/dL   Creatinine, Ser 9.13 0.44 - 1.00 mg/dL   Calcium  9.6 8.9 - 10.3 mg/dL   Total Protein 8.4 (H) 6.5 - 8.1 g/dL   Albumin 4.8 3.5 - 5.0 g/dL   AST 34 15 - 41 U/L    Comment: HEMOLYSIS AT THIS LEVEL MAY AFFECT RESULT   ALT 27 0 - 44 U/L    Comment: HEMOLYSIS AT THIS LEVEL MAY AFFECT RESULT   Alkaline Phosphatase 56 38 - 126 U/L   Total Bilirubin 0.8 0.0 - 1.2 mg/dL    Comment: HEMOLYSIS AT THIS LEVEL MAY AFFECT RESULT   GFR, Estimated >60 >60 mL/min    Comment: (NOTE) Calculated using the CKD-EPI Creatinine Equation (2021)    Anion gap 10 5 - 15    Comment: Performed at [facility], [address] [place], [place] [address]  CBC     Status: Abnormal   Collection Time: 02/24/23  5:16  PM  Result Value Ref Range   WBC 13.9 (H) 4.0 - 10.5 K/uL   RBC 4.93 3.87 - 5.11 MIL/uL   Hemoglobin 13.8 12.0 - 15.0 g/dL   HCT 58.2 63.9 - 53.9 %   MCV 84.6 80.0 - 100.0 fL   MCH 28.0 26.0 - 34.0 pg   MCHC 33.1 30.0 - 36.0 g/dL   RDW 86.6 88.4 - 84.4 %   Platelets 143 (L) 150 - 400 K/uL   nRBC 0.0 0.0 - 0.2 %    Comment: Performed at [facility], [address] [place], [place] [address]  Magnesium     Status: None   Collection Time: 02/24/23  5:16 PM  Result Value Ref Range   Magnesium 2.1 1.7 - 2.4 mg/dL    Comment: Performed at [facility], [address] [place], [place] [address]  Cervicovaginal ancillary only     Status: Abnormal   Collection Time: 03/24/23  2:24 PM  Result Value Ref Range   Neisseria Gonorrhea Negative    Chlamydia Negative    Trichomonas Negative    Bacterial Vaginitis (gardnerella) Positive (A)    Candida Vaginitis Negative    Candida Glabrata Negative    Comment      Normal Reference Range Bacterial Vaginosis - Negative   Comment Normal Reference Range Candida Species - Negative    Comment  Normal Reference Range Candida Galbrata - Negative    Comment Normal Reference Range Trichomonas - Negative    Comment Normal Reference Ranger Chlamydia - Negative    Comment      Normal Reference Range Neisseria Gonorrhea - Negative  HCV Ab w Reflex to Quant PCR     Status: None   Collection Time: 03/24/23  2:31 PM  Result Value Ref Range   HCV Ab Non Reactive Non Reactive  HIV Antibody (routine testing w rflx)     Status: None   Collection Time: 03/24/23  2:31 PM  Result Value Ref Range   HIV 1&2 Ab, 4th Generation NON-REACTIVE NON-REACTIVE    Comment: HIV-1 antigen and HIV-1/HIV-2 antibodies were not detected. There is no laboratory evidence of HIV infection. SABRA PLEASE NOTE: This information has been disclosed to you from records whose confidentiality may be protected by state law.  If your state requires such protection, then the state law prohibits you from making any further disclosure of the information without the specific written consent of the person to whom it pertains, or as otherwise permitted by law. A general authorization for the release of medical or other information is NOT sufficient for this purpose. . For additional information please refer to http://education.[facility].com/faq/FAQ106 (This link is being provided for informational/ educational purposes only.) . SABRA The performance of this assay has not been clinically validated in patients less than 27 years old. .   RPR     Status: None   Collection Time: 03/24/23  2:31 PM  Result Value Ref Range   RPR Ser Ql NON-REACTIVE NON-REACTIVE    Comment: . No laboratory evidence of syphilis. If recent exposure is suspected, submit a new sample in 2-4 weeks. .   Hepatitis B core antibody, total     Status: None   Collection Time: 03/24/23  2:31 PM  Result Value Ref Range   Hep B Core Total Ab NON-REACTIVE NON-REACTIVE    Comment: . For additional information, please refer to   http://education.[facility].com/faq/FAQ202  (This link is being provided for informational/ educational purposes  only.) .   Hepatitis B surface antibody,qualitative     Status: None   Collection Time: 03/24/23  2:31 PM  Result Value Ref Range   Hep B S Ab NON-REACTIVE NON-REACTIVE  Hepatitis B surface antigen     Status: None   Collection Time: 03/24/23  2:31 PM  Result Value Ref Range   Hepatitis B Surface Ag NON-REACTIVE NON-REACTIVE    Comment: . For additional information, please refer to  http://education.[facility].com/faq/FAQ202  (This link is being provided for informational/ educational purposes only.) .   Interpretation:     Status: None   Collection Time: 03/24/23  2:31 PM  Result Value Ref Range   HCV Interp 1: Comment     Comment: Not infected with HCV unless early or acute infection is suspected (which may be delayed in an immunocompromised individual), or other evidence exists to indicate HCV infection.         Beverley Adine Hummer, MD, MS

## 2023-03-25 ENCOUNTER — Other Ambulatory Visit: Payer: Self-pay

## 2023-03-25 DIAGNOSIS — F4310 Post-traumatic stress disorder, unspecified: Secondary | ICD-10-CM | POA: Diagnosis not present

## 2023-03-25 DIAGNOSIS — F411 Generalized anxiety disorder: Secondary | ICD-10-CM | POA: Diagnosis not present

## 2023-03-25 DIAGNOSIS — F331 Major depressive disorder, recurrent, moderate: Secondary | ICD-10-CM | POA: Diagnosis not present

## 2023-03-25 DIAGNOSIS — F422 Mixed obsessional thoughts and acts: Secondary | ICD-10-CM | POA: Diagnosis not present

## 2023-03-25 LAB — CERVICOVAGINAL ANCILLARY ONLY
Bacterial Vaginitis (gardnerella): POSITIVE — AB
Candida Glabrata: NEGATIVE
Candida Vaginitis: NEGATIVE
Chlamydia: NEGATIVE
Comment: NEGATIVE
Comment: NEGATIVE
Comment: NEGATIVE
Comment: NEGATIVE
Comment: NEGATIVE
Comment: NORMAL
Neisseria Gonorrhea: NEGATIVE
Trichomonas: NEGATIVE

## 2023-03-25 LAB — HEPATITIS B CORE ANTIBODY, TOTAL: Hep B Core Total Ab: NONREACTIVE

## 2023-03-25 LAB — HEPATITIS B SURFACE ANTIBODY,QUALITATIVE: Hep B S Ab: NONREACTIVE

## 2023-03-25 LAB — HEPATITIS B SURFACE ANTIGEN: Hepatitis B Surface Ag: NONREACTIVE

## 2023-03-25 LAB — HCV INTERPRETATION

## 2023-03-25 LAB — HIV ANTIBODY (ROUTINE TESTING W REFLEX): HIV 1&2 Ab, 4th Generation: NONREACTIVE

## 2023-03-25 LAB — RPR: RPR Ser Ql: NONREACTIVE

## 2023-03-25 LAB — HCV AB W REFLEX TO QUANT PCR: HCV Ab: NONREACTIVE

## 2023-03-26 ENCOUNTER — Encounter: Payer: Self-pay | Admitting: Family Medicine

## 2023-03-26 ENCOUNTER — Other Ambulatory Visit (HOSPITAL_COMMUNITY): Payer: Self-pay

## 2023-03-26 MED ORDER — METRONIDAZOLE 500 MG PO TABS
500.0000 mg | ORAL_TABLET | Freq: Two times a day (BID) | ORAL | 0 refills | Status: AC
  Filled 2023-03-26 – 2023-04-12 (×2): qty 14, 7d supply, fill #0

## 2023-03-28 ENCOUNTER — Other Ambulatory Visit: Payer: Self-pay

## 2023-04-03 ENCOUNTER — Encounter (HOSPITAL_COMMUNITY): Payer: Self-pay

## 2023-04-04 ENCOUNTER — Other Ambulatory Visit (HOSPITAL_COMMUNITY): Payer: Self-pay

## 2023-04-08 DIAGNOSIS — F411 Generalized anxiety disorder: Secondary | ICD-10-CM | POA: Diagnosis not present

## 2023-04-08 DIAGNOSIS — F331 Major depressive disorder, recurrent, moderate: Secondary | ICD-10-CM | POA: Diagnosis not present

## 2023-04-08 DIAGNOSIS — F4310 Post-traumatic stress disorder, unspecified: Secondary | ICD-10-CM | POA: Diagnosis not present

## 2023-04-08 DIAGNOSIS — F422 Mixed obsessional thoughts and acts: Secondary | ICD-10-CM | POA: Diagnosis not present

## 2023-04-12 ENCOUNTER — Other Ambulatory Visit (HOSPITAL_COMMUNITY): Payer: Self-pay

## 2023-04-12 ENCOUNTER — Other Ambulatory Visit: Payer: Self-pay

## 2023-04-21 ENCOUNTER — Other Ambulatory Visit (HOSPITAL_COMMUNITY): Payer: Self-pay | Admitting: Psychiatry

## 2023-04-21 DIAGNOSIS — F4312 Post-traumatic stress disorder, chronic: Secondary | ICD-10-CM

## 2023-04-21 DIAGNOSIS — F39 Unspecified mood [affective] disorder: Secondary | ICD-10-CM

## 2023-04-26 ENCOUNTER — Other Ambulatory Visit (HOSPITAL_COMMUNITY): Payer: Self-pay

## 2023-05-05 ENCOUNTER — Other Ambulatory Visit: Payer: Self-pay

## 2023-05-05 ENCOUNTER — Other Ambulatory Visit (HOSPITAL_COMMUNITY): Payer: Self-pay

## 2023-05-05 ENCOUNTER — Telehealth: Admitting: Physician Assistant

## 2023-05-05 DIAGNOSIS — T3695XA Adverse effect of unspecified systemic antibiotic, initial encounter: Secondary | ICD-10-CM | POA: Diagnosis not present

## 2023-05-05 DIAGNOSIS — B379 Candidiasis, unspecified: Secondary | ICD-10-CM | POA: Diagnosis not present

## 2023-05-05 MED ORDER — FLUCONAZOLE 150 MG PO TABS
150.0000 mg | ORAL_TABLET | ORAL | 0 refills | Status: DC | PRN
  Filled 2023-05-05 (×3): qty 2, 6d supply, fill #0

## 2023-05-05 NOTE — Progress Notes (Signed)

## 2023-05-13 ENCOUNTER — Other Ambulatory Visit (HOSPITAL_COMMUNITY): Payer: Self-pay | Admitting: Psychiatry

## 2023-05-13 DIAGNOSIS — F39 Unspecified mood [affective] disorder: Secondary | ICD-10-CM

## 2023-05-13 DIAGNOSIS — F4312 Post-traumatic stress disorder, chronic: Secondary | ICD-10-CM

## 2023-05-21 ENCOUNTER — Other Ambulatory Visit: Payer: Self-pay | Admitting: Obstetrics & Gynecology

## 2023-05-21 DIAGNOSIS — Z3042 Encounter for surveillance of injectable contraceptive: Secondary | ICD-10-CM

## 2023-05-21 DIAGNOSIS — E559 Vitamin D deficiency, unspecified: Secondary | ICD-10-CM

## 2023-05-22 MED ORDER — VITAMIN D (ERGOCALCIFEROL) 50000 UNITS PO CAPS
1.0000 | ORAL_CAPSULE | ORAL | 1 refills | Status: DC
  Filled 2023-05-22: qty 12, 84d supply, fill #0
  Filled 2023-10-03: qty 12, 84d supply, fill #1

## 2023-05-23 ENCOUNTER — Other Ambulatory Visit (HOSPITAL_COMMUNITY): Payer: Self-pay

## 2023-06-22 ENCOUNTER — Ambulatory Visit: Payer: 59 | Admitting: Gastroenterology

## 2023-06-27 ENCOUNTER — Other Ambulatory Visit (HOSPITAL_COMMUNITY): Payer: Self-pay

## 2023-07-05 ENCOUNTER — Other Ambulatory Visit (HOSPITAL_COMMUNITY): Payer: Self-pay

## 2023-07-05 ENCOUNTER — Encounter (HOSPITAL_COMMUNITY): Payer: Self-pay | Admitting: Psychiatry

## 2023-07-05 ENCOUNTER — Telehealth (HOSPITAL_BASED_OUTPATIENT_CLINIC_OR_DEPARTMENT_OTHER): Admitting: Psychiatry

## 2023-07-05 ENCOUNTER — Other Ambulatory Visit: Payer: Self-pay

## 2023-07-05 VITALS — Wt 192.0 lb

## 2023-07-05 DIAGNOSIS — F1210 Cannabis abuse, uncomplicated: Secondary | ICD-10-CM

## 2023-07-05 DIAGNOSIS — F4312 Post-traumatic stress disorder, chronic: Secondary | ICD-10-CM | POA: Diagnosis not present

## 2023-07-05 DIAGNOSIS — F39 Unspecified mood [affective] disorder: Secondary | ICD-10-CM

## 2023-07-05 MED ORDER — AMITRIPTYLINE HCL 75 MG PO TABS
75.0000 mg | ORAL_TABLET | Freq: Every day | ORAL | 1 refills | Status: DC
  Filled 2023-07-05: qty 30, 30d supply, fill #0
  Filled 2023-08-16: qty 30, 30d supply, fill #1
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
## 2023-07-05 NOTE — Addendum Note (Signed)
 Addended by: Eduard Grad T on: 07/05/2023 11:23 AM   Modules accepted: Orders, Level of Service

## 2023-07-13 ENCOUNTER — Ambulatory Visit

## 2023-07-15 ENCOUNTER — Ambulatory Visit: Admitting: Gastroenterology

## 2023-07-15 NOTE — Progress Notes (Deleted)
 Chief Complaint: Primary GI MD: Dr. General Kenner  HPI:  *** is a  ***  who was referred to me by Catheryn Cluck, MD for a complaint of *** .     Discussed the use of AI scribe software for clinical note transcription with the patient, who gave verbal consent to proceed.  History of Present Illness      PREVIOUS GI WORKUP     Past Medical History:  Diagnosis Date   Allergy    Anemia    Anxiety    Cannabinoid hyperemesis syndrome    Depression    Esophagitis    Gastritis    GERD (gastroesophageal reflux disease)    Heart murmur    Hypertension 12/21/2021    Past Surgical History:  Procedure Laterality Date   BIOPSY  07/23/2020   Procedure: BIOPSY;  Surgeon: Nannette Babe, MD;  Location: [facility];  Service: Gastroenterology;;   ESOPHAGOGASTRODUODENOSCOPY (EGD) WITH PROPOFOL  N/A 07/23/2020   Procedure: ESOPHAGOGASTRODUODENOSCOPY (EGD) WITH PROPOFOL ;  Surgeon: Nannette Babe, MD;  Location: [facility];  Service: Gastroenterology;  Laterality: N/A;    Current Outpatient Medications  Medication Sig Dispense Refill   amitriptyline  (ELAVIL ) 75 MG tablet Take 1 tablet (75 mg total) by mouth daily. 30 tablet 1   calcium  carbonate (OS-CAL) 1250 (500 Ca) MG chewable tablet Chew 1 tablet (1,250 mg total) by mouth daily. 90 tablet 1   famotidine  (PEPCID ) 20 MG tablet Take 0.5 tablets (10 mg total) by mouth 2 (two) times daily. 30 tablet 0   fluconazole  (DIFLUCAN ) 150 MG tablet Take 1 tablet (150 mg total) by mouth every 3 (three) days as needed. 2 tablet 0   lamoTRIgine  (LAMICTAL ) 100 MG tablet Take 1 tablet (100 mg) by mouth daily. 30 tablet 2   ondansetron  (ZOFRAN -ODT) 8 MG disintegrating tablet Dissolve 1 tablet by mouth every 8 hours as needed for nausea or vomiting. 30 tablet 0   pantoprazole  (PROTONIX ) 40 MG tablet Take 1 tablet (40 mg total) by mouth daily. 90 tablet 0   promethazine  (PHENERGAN ) 12.5 MG tablet Take 1 tablet by mouth every 8 hours as needed for  nausea or vomiting. 30 tablet 0   SUMAtriptan  (IMITREX ) 20 MG/ACT nasal spray Place 1 spray into the nose at onset as needed for migraine or headache. May repeat in 2 hours if abdominal pain/vomiting persists or recurs. Do not take more than 2 doses in a day and 6 doses in a week 6 each 6   triamcinolone  ointment (KENALOG ) 0.5 % Apply 1 application topically 2 (two) times daily. For moderate to severe eczema.  Do not use for more than 1 week at a time. 60 g 3   Vitamin D , Ergocalciferol , 50000 units CAPS Take 1 capsule by mouth once a week. 12 capsule 1   No current facility-administered medications for this visit.    Allergies as of 07/15/2023   (No Known Allergies)    Family History  Problem Relation Age of Onset   Heart disease Mother    Hypertension Mother    Hyperlipidemia Mother    ADD / ADHD Father    Healthy Father    Healthy Sister    Liver disease Maternal Grandmother    Diabetes Maternal Uncle    Colon cancer Neg Hx    Esophageal cancer Neg Hx    Stomach cancer Neg Hx    Rectal cancer Neg Hx    Pancreatic cancer Neg Hx     Social History  Socioeconomic History   Marital status: Single    Spouse name: Not on file   Number of children: 0   Years of education: Not on file   Highest education level: Not on file  Occupational History   Occupation: RN  Tobacco Use   Smoking status: Never    Passive exposure: Never   Smokeless tobacco: Never  Vaping Use   Vaping status: Never Used  Substance and Sexual Activity   Alcohol use: Yes    Alcohol/week: 2.0 standard drinks of alcohol    Types: 2 Glasses of wine per week    Comment: Occasionally   Drug use: Yes    Types: Marijuana    Comment: daily   Sexual activity: Yes  Other Topics Concern   Not on file  Social History Narrative   Not on file   Social Drivers of Health   Financial Resource Strain: Not on file  Food Insecurity: No Food Insecurity (12/21/2021)   Hunger Vital Sign    Worried About Running  Out of Food in the Last Year: Never true    Ran Out of Food in the Last Year: Never true  Transportation Needs: No Transportation Needs (12/21/2021)   PRAPARE - Administrator, Civil Service (Medical): No    Lack of Transportation (Non-Medical): No  Physical Activity: Not on file  Stress: Not on file  Social Connections: Not on file  Intimate Partner Violence: Not on file    Review of Systems:    Constitutional: No weight loss, fever, chills, weakness or fatigue HEENT: Eyes: No change in vision               Ears, Nose, Throat:  No change in hearing or congestion Skin: No rash or itching Cardiovascular: No chest pain, chest pressure or palpitations   Respiratory: No SOB or cough Gastrointestinal: See HPI and otherwise negative Genitourinary: No dysuria or change in urinary frequency Neurological: No headache, dizziness or syncope Musculoskeletal: No new muscle or joint pain Hematologic: No bleeding or bruising Psychiatric: No history of depression or anxiety    Physical Exam:  Vital signs: There were no vitals taken for this visit.  Constitutional: NAD, alert and cooperative Head:  Normocephalic and atraumatic. Eyes:   PEERL, EOMI. No icterus. Conjunctiva pink. Respiratory: Respirations even and unlabored. Lungs clear to auscultation bilaterally.   No wheezes, crackles, or rhonchi.  Cardiovascular:  Regular rate and rhythm. No peripheral edema, cyanosis or pallor.  Gastrointestinal:  Soft, nondistended, nontender. No rebound or guarding. Normal bowel sounds. No appreciable masses or hepatomegaly. Rectal:  Declines Msk:  Symmetrical without gross deformities. Without edema, no deformity or joint abnormality.  Neurologic:  Alert and  oriented x4;  grossly normal neurologically.  Skin:   Dry and intact without significant lesions or rashes. Psychiatric: Oriented to person, place and time. Demonstrates good judgement and reason without abnormal affect or  behaviors.  Physical Exam    RELEVANT LABS AND IMAGING: CBC    Component Value Date/Time   WBC 13.9 (H) 02/24/2023 1716   RBC 4.93 02/24/2023 1716   HGB 13.8 02/24/2023 1716   HCT 41.7 02/24/2023 1716   PLT 143 (L) 02/24/2023 1716   MCV 84.6 02/24/2023 1716   MCV 85.7 02/09/2018 1239   MCH 28.0 02/24/2023 1716   MCHC 33.1 02/24/2023 1716   RDW 13.3 02/24/2023 1716   LYMPHSABS 2.8 06/11/2022 1353   MONOABS 0.5 06/11/2022 1353   EOSABS 0.2 06/11/2022 1353   BASOSABS  0.0 06/11/2022 1353    CMP     Component Value Date/Time   NA 137 02/24/2023 1716   NA 137 02/09/2018 1558   K 3.9 02/24/2023 1716   CL 104 02/24/2023 1716   CO2 23 02/24/2023 1716   GLUCOSE 126 (H) 02/24/2023 1716   BUN 12 02/24/2023 1716   BUN 10 02/09/2018 1558   CREATININE 0.86 02/24/2023 1716   CALCIUM  9.6 02/24/2023 1716   PROT 8.4 (H) 02/24/2023 1716   PROT 7.9 02/09/2018 1558   ALBUMIN 4.8 02/24/2023 1716   ALBUMIN 4.8 02/09/2018 1558   AST 34 02/24/2023 1716   ALT 27 02/24/2023 1716   ALKPHOS 56 02/24/2023 1716   BILITOT 0.8 02/24/2023 1716   BILITOT 0.4 02/09/2018 1558   GFRNONAA >60 02/24/2023 1716   GFRAA >60 06/01/2019 1126     Assessment/Plan:   Assessment and Plan Assessment & Plan        Diana Thomas [facility] 07/15/2023, 8:22 AM  Cc: Catheryn Cluck, MD

## 2023-08-16 ENCOUNTER — Telehealth (HOSPITAL_BASED_OUTPATIENT_CLINIC_OR_DEPARTMENT_OTHER): Admitting: Psychiatry

## 2023-08-16 ENCOUNTER — Other Ambulatory Visit: Payer: Self-pay

## 2023-08-16 ENCOUNTER — Encounter (HOSPITAL_COMMUNITY): Payer: Self-pay | Admitting: Psychiatry

## 2023-08-16 ENCOUNTER — Other Ambulatory Visit (HOSPITAL_COMMUNITY): Payer: Self-pay

## 2023-08-16 VITALS — Wt 193.0 lb

## 2023-08-16 DIAGNOSIS — F4312 Post-traumatic stress disorder, chronic: Secondary | ICD-10-CM | POA: Diagnosis not present

## 2023-08-16 DIAGNOSIS — F39 Unspecified mood [affective] disorder: Secondary | ICD-10-CM | POA: Diagnosis not present

## 2023-08-16 DIAGNOSIS — F1210 Cannabis abuse, uncomplicated: Secondary | ICD-10-CM | POA: Diagnosis not present

## 2023-08-16 MED ORDER — AMITRIPTYLINE HCL 75 MG PO TABS
75.0000 mg | ORAL_TABLET | Freq: Every day | ORAL | 2 refills | Status: DC
  Filled 2023-08-24: qty 30, 30d supply, fill #0
  Filled 2023-10-03: qty 30, 30d supply, fill #1
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
## 2023-08-25 ENCOUNTER — Other Ambulatory Visit (HOSPITAL_COMMUNITY): Payer: Self-pay

## 2023-10-03 ENCOUNTER — Other Ambulatory Visit: Payer: Self-pay

## 2023-11-16 ENCOUNTER — Telehealth (HOSPITAL_COMMUNITY): Admitting: Psychiatry

## 2023-11-16 ENCOUNTER — Other Ambulatory Visit (HOSPITAL_COMMUNITY): Payer: Self-pay

## 2023-11-16 ENCOUNTER — Encounter (HOSPITAL_COMMUNITY): Payer: Self-pay | Admitting: Psychiatry

## 2023-11-16 ENCOUNTER — Other Ambulatory Visit: Payer: Self-pay

## 2023-11-16 VITALS — Wt 200.0 lb

## 2023-11-16 DIAGNOSIS — F39 Unspecified mood [affective] disorder: Secondary | ICD-10-CM

## 2023-11-16 DIAGNOSIS — F4312 Post-traumatic stress disorder, chronic: Secondary | ICD-10-CM

## 2023-11-16 DIAGNOSIS — F1210 Cannabis abuse, uncomplicated: Secondary | ICD-10-CM | POA: Diagnosis not present

## 2023-11-16 MED ORDER — AMITRIPTYLINE HCL 75 MG PO TABS
75.0000 mg | ORAL_TABLET | Freq: Every day | ORAL | 2 refills | Status: DC
  Filled 2023-11-16: qty 30, 30d supply, fill #0
  Filled 2023-12-29: qty 30, 30d supply, fill #1
  Filled 2024-02-06: qty 30, 30d supply, fill #2
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
## 2023-11-29 ENCOUNTER — Ambulatory Visit

## 2023-11-29 ENCOUNTER — Other Ambulatory Visit (HOSPITAL_COMMUNITY): Payer: Self-pay

## 2023-11-29 VITALS — BP 112/71 | HR 100 | Ht 64.0 in | Wt 203.0 lb

## 2023-11-29 DIAGNOSIS — Z3202 Encounter for pregnancy test, result negative: Secondary | ICD-10-CM

## 2023-11-29 DIAGNOSIS — Z30011 Encounter for initial prescription of contraceptive pills: Secondary | ICD-10-CM

## 2023-11-29 LAB — POCT URINE PREGNANCY: Preg Test, Ur: NEGATIVE

## 2023-11-29 MED ORDER — NORETHIN ACE-ETH ESTRAD-FE 1-20 MG-MCG PO TABS
1.0000 | ORAL_TABLET | Freq: Every day | ORAL | 3 refills | Status: AC
  Filled 2023-11-29: qty 84, 84d supply, fill #0
  Filled 2024-03-03: qty 84, 84d supply, fill #1

## 2023-11-29 NOTE — Progress Notes (Signed)
 GYNECOLOGY PROBLEM OFFICE VISIT NOTE  History:  Diana Thomas is a 28 y.o. G1P0010 here today for birth control counseling.   She states her LMP was in August.  Patient is unconcerned about this and notes that she has experienced irregular cycles throughout her lifetime.   She reports a history of Depo Provera  dosing since 12.   She states she has gained weight and does not desire to gain any more.  She requests oral contraception.   She does not desire children in her lifetime.    Taking amitriptyline  75mg  and protonix  40mg  at hs.  Past Medical History:  Diagnosis Date   Allergy    Anemia    Anxiety    Cannabinoid hyperemesis syndrome    Depression    Esophagitis    Gastritis    GERD (gastroesophageal reflux disease)    Heart murmur    Hypertension 12/21/2021    Past Surgical History:  Procedure Laterality Date   BIOPSY  07/23/2020   Procedure: BIOPSY;  Surgeon: Albertus Gordy HERO, MD;  Location: [facility];  Service: Gastroenterology;;   ESOPHAGOGASTRODUODENOSCOPY (EGD) WITH PROPOFOL  N/A 07/23/2020   Procedure: ESOPHAGOGASTRODUODENOSCOPY (EGD) WITH PROPOFOL ;  Surgeon: Albertus Gordy HERO, MD;  Location: [facility];  Service: Gastroenterology;  Laterality: N/A;    The following portions of the patient's history were reviewed and updated as appropriate: allergies, current medications, past family history, past medical history, past social history, past surgical history and problem list.   Health Maintenance:  Patient's last menstrual period was 09/18/2023. Normal pap Nov 2023.  No mammogram on file d/t age.   Review of Systems:  Breasts: Not examined. Genito-Urinary ROS: negative Gastrointestinal ROS: negative Objective:  Vitals: BP 112/71   Pulse 100   Ht 5' 4 (1.626 m)   Wt 203 lb (92.1 kg)   LMP 09/18/2023   BMI 34.84 kg/m   Physical Exam: Physical Exam Constitutional:      Appearance: Normal appearance.  HENT:     Head: Normocephalic and atraumatic.   Eyes:     Conjunctiva/sclera: Conjunctivae normal.  Cardiovascular:     Rate and Rhythm: Normal rate.     Heart sounds: Normal heart sounds.  Pulmonary:     Effort: Pulmonary effort is normal. No respiratory distress.     Breath sounds: Normal breath sounds.  Abdominal:     General: Bowel sounds are normal.     Palpations: Abdomen is soft.     Tenderness: There is no abdominal tenderness.  Musculoskeletal:        General: Normal range of motion.     Cervical back: Normal range of motion.  Neurological:     Mental Status: She is alert and oriented to person, place, and time.  Skin:    General: Skin is warm and dry.  Psychiatric:        Mood and Affect: Mood normal.        Behavior: Behavior normal.  Vitals reviewed.      Labs and Imaging: No results found.  Assessment & Plan:  28 year old Desires Oral Contraception  -UPT Negative.  -Reviewed documented h/o HTN. Patient states she is not currently on medication, but endorses history.  -BP today normotensive.  -Reviewed potential side effects of oral contraception usage including HA, abnormal bleeding, and mood changes.   -Further discussed symptoms that would require discontinuation including persistent HA/migraines associated with visual changes, dizziness, or numbness/weakness in extremities, chest pain, easily bruising, and/or abdominal pain.   -  Recommend returning in 3 months for bp check and calling/messaging regarding any symptoms. -Rx for LoLo Estrin sent to pharmacy on file.  -Informed okay to start today if desired.    Total face-to-face time with patient: 15 minutes   Synthia Raisin, [place] 11/29/2023 3:36 PM

## 2023-12-30 ENCOUNTER — Other Ambulatory Visit (HOSPITAL_COMMUNITY): Payer: Self-pay

## 2024-02-06 ENCOUNTER — Other Ambulatory Visit (HOSPITAL_COMMUNITY): Payer: Self-pay

## 2024-02-22 ENCOUNTER — Encounter (HOSPITAL_COMMUNITY): Payer: Self-pay | Admitting: Psychiatry

## 2024-02-22 ENCOUNTER — Other Ambulatory Visit (HOSPITAL_COMMUNITY): Payer: Self-pay

## 2024-02-22 ENCOUNTER — Telehealth (HOSPITAL_COMMUNITY): Admitting: Psychiatry

## 2024-02-22 VITALS — Wt 190.0 lb

## 2024-02-22 DIAGNOSIS — F4312 Post-traumatic stress disorder, chronic: Secondary | ICD-10-CM | POA: Diagnosis not present

## 2024-02-22 DIAGNOSIS — F39 Unspecified mood [affective] disorder: Secondary | ICD-10-CM

## 2024-02-22 DIAGNOSIS — F1210 Cannabis abuse, uncomplicated: Secondary | ICD-10-CM

## 2024-02-22 MED ORDER — AMITRIPTYLINE HCL 75 MG PO TABS
75.0000 mg | ORAL_TABLET | Freq: Every day | ORAL | 2 refills | Status: AC
  Filled 2024-02-22 – 2024-03-03 (×2): qty 30, 30d supply, fill #0
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
## 2024-02-29 ENCOUNTER — Ambulatory Visit

## 2024-03-03 ENCOUNTER — Other Ambulatory Visit (HOSPITAL_COMMUNITY): Payer: Self-pay

## 2024-03-03 ENCOUNTER — Other Ambulatory Visit: Payer: Self-pay | Admitting: Obstetrics & Gynecology

## 2024-03-03 DIAGNOSIS — Z3042 Encounter for surveillance of injectable contraceptive: Secondary | ICD-10-CM

## 2024-03-03 DIAGNOSIS — E559 Vitamin D deficiency, unspecified: Secondary | ICD-10-CM

## 2024-03-04 ENCOUNTER — Other Ambulatory Visit (HOSPITAL_COMMUNITY): Payer: Self-pay

## 2024-03-05 ENCOUNTER — Other Ambulatory Visit: Payer: Self-pay

## 2024-03-05 ENCOUNTER — Other Ambulatory Visit (HOSPITAL_COMMUNITY): Payer: Self-pay

## 2024-03-07 ENCOUNTER — Other Ambulatory Visit (HOSPITAL_COMMUNITY): Payer: Self-pay

## 2024-03-07 ENCOUNTER — Other Ambulatory Visit: Payer: Self-pay

## 2024-03-07 MED ORDER — VITAMIN D (ERGOCALCIFEROL) 50000 UNITS PO CAPS
1.0000 | ORAL_CAPSULE | ORAL | 1 refills | Status: AC
  Filled 2024-03-07: qty 12, 84d supply, fill #0

## 2024-05-23 ENCOUNTER — Telehealth (HOSPITAL_COMMUNITY): Admitting: Psychiatry
